# Patient Record
Sex: Female | Born: 1950 | Race: White | Hispanic: No | Marital: Married | Smoking: Never smoker
Health system: Southern US, Community
[De-identification: ages and names within clinical notes are randomized; demographics above are authoritative.]

## PROBLEM LIST (undated history)

## (undated) DIAGNOSIS — T7840XA Allergy, unspecified, initial encounter: Secondary | ICD-10-CM

## (undated) DIAGNOSIS — Z8041 Family history of malignant neoplasm of ovary: Secondary | ICD-10-CM

## (undated) DIAGNOSIS — M5020 Other cervical disc displacement, unspecified cervical region: Secondary | ICD-10-CM

## (undated) DIAGNOSIS — E785 Hyperlipidemia, unspecified: Secondary | ICD-10-CM

## (undated) DIAGNOSIS — E039 Hypothyroidism, unspecified: Secondary | ICD-10-CM

## (undated) DIAGNOSIS — Z1371 Encounter for nonprocreative screening for genetic disease carrier status: Secondary | ICD-10-CM

## (undated) DIAGNOSIS — R760 Raised antibody titer: Secondary | ICD-10-CM

## (undated) DIAGNOSIS — F32A Depression, unspecified: Secondary | ICD-10-CM

## (undated) DIAGNOSIS — E7800 Pure hypercholesterolemia, unspecified: Secondary | ICD-10-CM

## (undated) DIAGNOSIS — M8580 Other specified disorders of bone density and structure, unspecified site: Secondary | ICD-10-CM

## (undated) DIAGNOSIS — Z972 Presence of dental prosthetic device (complete) (partial): Secondary | ICD-10-CM

## (undated) DIAGNOSIS — G4730 Sleep apnea, unspecified: Secondary | ICD-10-CM

## (undated) DIAGNOSIS — E079 Disorder of thyroid, unspecified: Secondary | ICD-10-CM

## (undated) DIAGNOSIS — F419 Anxiety disorder, unspecified: Secondary | ICD-10-CM

## (undated) DIAGNOSIS — M1990 Unspecified osteoarthritis, unspecified site: Secondary | ICD-10-CM

## (undated) DIAGNOSIS — E559 Vitamin D deficiency, unspecified: Secondary | ICD-10-CM

## (undated) DIAGNOSIS — R42 Dizziness and giddiness: Secondary | ICD-10-CM

## (undated) DIAGNOSIS — F329 Major depressive disorder, single episode, unspecified: Secondary | ICD-10-CM

## (undated) DIAGNOSIS — K219 Gastro-esophageal reflux disease without esophagitis: Secondary | ICD-10-CM

## (undated) DIAGNOSIS — B009 Herpesviral infection, unspecified: Secondary | ICD-10-CM

## (undated) DIAGNOSIS — L719 Rosacea, unspecified: Secondary | ICD-10-CM

## (undated) HISTORY — DX: Unspecified osteoarthritis, unspecified site: M19.90

## (undated) HISTORY — DX: Allergy, unspecified, initial encounter: T78.40XA

## (undated) HISTORY — PX: DILATION AND CURETTAGE OF UTERUS: SHX78

## (undated) HISTORY — DX: Family history of malignant neoplasm of ovary: Z80.41

## (undated) HISTORY — DX: Herpesviral infection, unspecified: B00.9

## (undated) HISTORY — DX: Gastro-esophageal reflux disease without esophagitis: K21.9

## (undated) HISTORY — DX: Hyperlipidemia, unspecified: E78.5

## (undated) HISTORY — DX: Depression, unspecified: F32.A

## (undated) HISTORY — DX: Sleep apnea, unspecified: G47.30

## (undated) HISTORY — PX: EYE SURGERY: SHX253

## (undated) HISTORY — DX: Rosacea, unspecified: L71.9

## (undated) HISTORY — DX: Raised antibody titer: R76.0

## (undated) HISTORY — DX: Anxiety disorder, unspecified: F41.9

## (undated) HISTORY — DX: Hypothyroidism, unspecified: E03.9

## (undated) HISTORY — DX: Pure hypercholesterolemia, unspecified: E78.00

## (undated) HISTORY — PX: CERVICAL BIOPSY  W/ LOOP ELECTRODE EXCISION: SUR135

## (undated) HISTORY — DX: Vitamin D deficiency, unspecified: E55.9

## (undated) HISTORY — DX: Encounter for nonprocreative screening for genetic disease carrier status: Z13.71

## (undated) HISTORY — DX: Other specified disorders of bone density and structure, unspecified site: M85.80

## (undated) HISTORY — DX: Major depressive disorder, single episode, unspecified: F32.9

## (undated) HISTORY — PX: COLONOSCOPY: SHX174

## (undated) HISTORY — DX: Disorder of thyroid, unspecified: E07.9

---

## 2004-11-08 ENCOUNTER — Ambulatory Visit: Payer: Self-pay

## 2005-09-25 ENCOUNTER — Ambulatory Visit: Payer: Self-pay | Admitting: Family Medicine

## 2005-11-28 ENCOUNTER — Ambulatory Visit: Payer: Self-pay

## 2006-12-18 ENCOUNTER — Ambulatory Visit: Payer: Self-pay

## 2007-04-02 ENCOUNTER — Ambulatory Visit: Payer: Self-pay | Admitting: Gastroenterology

## 2007-12-24 ENCOUNTER — Ambulatory Visit: Payer: Self-pay

## 2008-12-29 ENCOUNTER — Ambulatory Visit: Payer: Self-pay

## 2010-01-04 ENCOUNTER — Ambulatory Visit: Payer: Self-pay

## 2010-06-08 ENCOUNTER — Ambulatory Visit: Payer: Self-pay | Admitting: Family Medicine

## 2011-01-10 ENCOUNTER — Ambulatory Visit: Payer: Self-pay

## 2012-03-05 ENCOUNTER — Ambulatory Visit: Payer: Self-pay

## 2013-06-14 ENCOUNTER — Ambulatory Visit: Payer: Self-pay | Admitting: Physician Assistant

## 2013-06-14 LAB — RAPID INFLUENZA A&B ANTIGENS

## 2013-09-02 ENCOUNTER — Ambulatory Visit: Payer: Self-pay | Admitting: Family Medicine

## 2014-03-04 ENCOUNTER — Ambulatory Visit: Payer: Self-pay | Admitting: Family Medicine

## 2014-03-08 ENCOUNTER — Telehealth: Payer: Self-pay | Admitting: Neurology

## 2014-03-08 NOTE — Telephone Encounter (Signed)
Pt canceled appt to see Dr Delice Lesch on 04-06-14 due to the fact she got in sooner somewhere else the referring dr was notified

## 2014-03-14 DIAGNOSIS — M1990 Unspecified osteoarthritis, unspecified site: Secondary | ICD-10-CM | POA: Insufficient documentation

## 2014-03-24 ENCOUNTER — Ambulatory Visit: Payer: Self-pay | Admitting: Neurology

## 2014-04-06 ENCOUNTER — Ambulatory Visit: Payer: Self-pay | Admitting: Neurology

## 2014-06-03 DIAGNOSIS — Z8041 Family history of malignant neoplasm of ovary: Secondary | ICD-10-CM

## 2014-06-03 DIAGNOSIS — Z1371 Encounter for nonprocreative screening for genetic disease carrier status: Secondary | ICD-10-CM

## 2014-06-03 HISTORY — DX: Encounter for nonprocreative screening for genetic disease carrier status: Z13.71

## 2014-06-03 HISTORY — DX: Family history of malignant neoplasm of ovary: Z80.41

## 2014-09-05 DIAGNOSIS — J302 Other seasonal allergic rhinitis: Secondary | ICD-10-CM

## 2014-09-05 DIAGNOSIS — M549 Dorsalgia, unspecified: Secondary | ICD-10-CM

## 2014-09-05 DIAGNOSIS — G8929 Other chronic pain: Secondary | ICD-10-CM

## 2014-09-05 DIAGNOSIS — F419 Anxiety disorder, unspecified: Secondary | ICD-10-CM | POA: Insufficient documentation

## 2014-09-05 DIAGNOSIS — M8580 Other specified disorders of bone density and structure, unspecified site: Secondary | ICD-10-CM | POA: Insufficient documentation

## 2014-09-05 DIAGNOSIS — L709 Acne, unspecified: Secondary | ICD-10-CM | POA: Insufficient documentation

## 2014-09-05 DIAGNOSIS — E785 Hyperlipidemia, unspecified: Secondary | ICD-10-CM | POA: Insufficient documentation

## 2014-09-05 DIAGNOSIS — M542 Cervicalgia: Secondary | ICD-10-CM

## 2014-09-05 DIAGNOSIS — L719 Rosacea, unspecified: Secondary | ICD-10-CM | POA: Insufficient documentation

## 2014-09-05 DIAGNOSIS — E559 Vitamin D deficiency, unspecified: Secondary | ICD-10-CM | POA: Insufficient documentation

## 2014-09-05 DIAGNOSIS — J309 Allergic rhinitis, unspecified: Secondary | ICD-10-CM | POA: Insufficient documentation

## 2014-09-05 DIAGNOSIS — I7300 Raynaud's syndrome without gangrene: Secondary | ICD-10-CM | POA: Insufficient documentation

## 2014-09-05 DIAGNOSIS — L700 Acne vulgaris: Secondary | ICD-10-CM

## 2014-09-05 DIAGNOSIS — E7800 Pure hypercholesterolemia, unspecified: Secondary | ICD-10-CM

## 2014-09-05 DIAGNOSIS — E039 Hypothyroidism, unspecified: Secondary | ICD-10-CM

## 2014-09-05 DIAGNOSIS — R4182 Altered mental status, unspecified: Secondary | ICD-10-CM | POA: Insufficient documentation

## 2014-11-03 ENCOUNTER — Ambulatory Visit (INDEPENDENT_AMBULATORY_CARE_PROVIDER_SITE_OTHER): Payer: BLUE CROSS/BLUE SHIELD | Admitting: Family Medicine

## 2014-11-03 ENCOUNTER — Encounter: Payer: Self-pay | Admitting: Family Medicine

## 2014-11-03 VITALS — BP 112/60 | HR 90 | Temp 98.6°F | Resp 16 | Ht 63.0 in | Wt 124.8 lb

## 2014-11-03 DIAGNOSIS — F411 Generalized anxiety disorder: Secondary | ICD-10-CM

## 2014-11-03 DIAGNOSIS — Q752 Hypertelorism: Secondary | ICD-10-CM | POA: Diagnosis not present

## 2014-11-03 DIAGNOSIS — E785 Hyperlipidemia, unspecified: Secondary | ICD-10-CM | POA: Diagnosis not present

## 2014-11-03 NOTE — Patient Instructions (Signed)
Patient to return in 6 months.

## 2014-11-03 NOTE — Progress Notes (Signed)
Patient ID: Sandra Horton, female   DOB: [birth year], 64 y.o.   MRN: [id number]  Name: Sandra Horton   MRN: [id number]    DOB: April 04, 1951   Date:11/03/2014       Progress Note  Subjective  Chief Complaint 64 YO FEMALE HERE FOR F/W OF ANXIETY, HYPOTX, HYPERLIPIDEMIA  Chief Complaint  Patient presents with  . Hypothyroidism    Thyroid Problem Presents for follow-up visit. Symptoms include anxiety, cold intolerance, dry skin, fatigue and hair loss. Patient reports no palpitations, weight gain or weight loss. The symptoms have been stable. Past treatments include levothyroxine. The treatment provided significant relief. The following procedures have not been performed: radioiodine uptake scan, thyroid FNA, thyroid ultrasound and thyroidectomy. Her past medical history is significant for hyperlipidemia. There is no history of atrial fibrillation or diabetes. There are no known risk factors.  Anxiety Presents for follow-up visit. Symptoms include excessive worry, insomnia, muscle tension and nervous/anxious behavior. Patient reports no chest pain, dizziness, hyperventilation or palpitations. Symptoms occur most days. The severity of symptoms is moderate. The symptoms are aggravated by work stress.    Hyperlipidemia This is a chronic problem. The current episode started more than 1 year ago. The problem is controlled. Recent lipid tests were reviewed and are normal. Exacerbating diseases include hypothyroidism. She has no history of diabetes. Pertinent negatives include no chest pain. Current antihyperlipidemic treatment includes statins. There are no compliance problems.  Risk factors for coronary artery disease include dyslipidemia and stress.                This is a 64 year old female here today for her 6 month follow up thyroid diease. Patient states she is doing well with no problems. No wt change, heat or cold intolerance.   Anxiety is stable on current medications;. Hyperlipidemia stable on  current meds.   Past Medical History  Diagnosis Date  . Osteoporosis   . Thyroid disease   . GERD (gastroesophageal reflux disease)   . Anxiety   . Rosacea     Past Surgical History  Procedure Laterality Date  . Colonoscopy      Family History  Problem Relation Age of Onset  . Cancer Mother   . Diabetes Mother   . Thyroid disease Mother   . Hypertension Father   . Stroke Father   . Diabetes Daughter     History   Social History  . Marital Status: Married    Spouse Name: N/A  . Number of Children: N/A  . Years of Education: N/A   Occupational History  . Not on file.   Social History Main Topics  . Smoking status: Never Smoker   . Smokeless tobacco: Not on file  . Alcohol Use: No  . Drug Use: No  . Sexual Activity: Not on file   Other Topics Concern  . Not on file   Social History Narrative     Current outpatient prescriptions:  .  alendronate (FOSAMAX) 35 MG tablet, Take 35 mg by mouth every 7 (seven) days. Take with a full glass of water on an empty stomach., Disp: , Rfl:  .  aspirin 81 MG tablet, Take 81 mg by mouth daily., Disp: , Rfl:  .  doxycycline (DORYX) 100 MG EC tablet, Take 100 mg by mouth 2 (two) times daily., Disp: , Rfl:  .  levothyroxine (SYNTHROID, LEVOTHROID) 125 MCG tablet, Take 125 mcg by mouth daily before breakfast., Disp: , Rfl:  .  loratadine (CLARITIN)  10 MG tablet, Take 10 mg by mouth daily., Disp: , Rfl:  .  lovastatin (MEVACOR) 40 MG tablet, Take 40 mg by mouth at bedtime., Disp: , Rfl:  .  meloxicam (MOBIC) 15 MG tablet, Take 15 mg by mouth daily., Disp: , Rfl:  .  omeprazole (PRILOSEC) 20 MG capsule, Take 20 mg by mouth daily., Disp: , Rfl:  .  PARoxetine (PAXIL) 20 MG tablet, Take 20 mg by mouth daily., Disp: , Rfl:   Allergies  Allergen Reactions  . Penicillin V Potassium Nausea And Vomiting  . Penicillins   . Tetanus Toxoid Nausea And Vomiting     Review of Systems  Constitutional: Positive for fatigue. Negative  for fever, chills, weight loss, weight gain and malaise/fatigue.  Respiratory: Negative for cough.   Cardiovascular: Negative for chest pain and palpitations.  Gastrointestinal: Negative for heartburn.  Genitourinary: Negative for dysuria.  Musculoskeletal: Negative for neck pain.  Neurological: Negative for dizziness.  Endo/Heme/Allergies: Positive for cold intolerance. Negative for polydipsia. Does not bruise/bleed easily.  Psychiatric/Behavioral: Negative for depression. The patient is nervous/anxious and has insomnia.       Objective  Filed Vitals:   11/03/14 0904  BP: 112/60  Pulse: 90  Temp: 98.6 F (37 C)  TempSrc: Oral  Resp: 16  Height: 5\' 3"  (1.6 m)  Weight: 124 lb 12.8 oz (56.609 kg)  SpO2: 97%    Physical Exam  Constitutional: She is oriented to person, place, and time and well-developed, well-nourished, and in no distress.  Eyes: Pupils are equal, round, and reactive to light.  Neck: Neck supple. No tracheal deviation present. No thyromegaly present.  Cardiovascular: Normal rate, regular rhythm and normal heart sounds.   Pulses:      Dorsalis pedis pulses are 2+ on the right side, and 2+ on the left side.       Posterior tibial pulses are 2+ on the right side, and 2+ on the left side.  No edema  Pulmonary/Chest: Effort normal and breath sounds normal.  Musculoskeletal: She exhibits no edema.  Lymphadenopathy:    She has no cervical adenopathy.  Neurological: She is alert and oriented to person, place, and time.  Skin: Skin is warm and dry.  Psychiatric: Mood and memory normal.       No results found for this or any previous visit (from the past 2160 hour(s)).   Assessment & Plan  1. HYPOTHYROIDISM STABLE  - TSH  2. Hyperlipemia  - Comprehensive metabolic panel - Lipid panel  3. Generalized anxiety disorder  DOING WELL ON CURRENT REGIMEN  Patient to return in 6 months for thyroid recheck.

## 2014-11-04 ENCOUNTER — Telehealth: Payer: Self-pay | Admitting: Emergency Medicine

## 2014-11-04 LAB — COMPREHENSIVE METABOLIC PANEL
ALBUMIN: 4.4 g/dL (ref 3.6–4.8)
ALT: 26 IU/L (ref 0–32)
AST: 27 IU/L (ref 0–40)
Albumin/Globulin Ratio: 1.8 (ref 1.1–2.5)
Alkaline Phosphatase: 54 IU/L (ref 39–117)
BUN / CREAT RATIO: 14 (ref 11–26)
BUN: 13 mg/dL (ref 8–27)
Bilirubin Total: 0.4 mg/dL (ref 0.0–1.2)
CALCIUM: 9.8 mg/dL (ref 8.7–10.3)
CHLORIDE: 102 mmol/L (ref 97–108)
CO2: 25 mmol/L (ref 18–29)
CREATININE: 0.95 mg/dL (ref 0.57–1.00)
GFR, EST AFRICAN AMERICAN: 73 mL/min/{1.73_m2} (ref >59)
GFR, EST NON AFRICAN AMERICAN: 63 mL/min/{1.73_m2} (ref >59)
Globulin, Total: 2.4 g/dL (ref 1.5–4.5)
Glucose: 89 mg/dL (ref 65–99)
Potassium: 4.7 mmol/L (ref 3.5–5.2)
SODIUM: 141 mmol/L (ref 134–144)
TOTAL PROTEIN: 6.8 g/dL (ref 6.0–8.5)

## 2014-11-04 LAB — LIPID PANEL
CHOLESTEROL TOTAL: 169 mg/dL (ref 100–199)
Chol/HDL Ratio: 2.4 ratio units (ref 0.0–4.4)
HDL: 71 mg/dL (ref >39)
LDL Calculated: 84 mg/dL (ref 0–99)
Triglycerides: 72 mg/dL (ref 0–149)
VLDL Cholesterol Cal: 14 mg/dL (ref 5–40)

## 2014-11-04 LAB — TSH: TSH: 0.545 u[IU]/mL (ref 0.450–4.500)

## 2014-11-04 NOTE — Telephone Encounter (Signed)
Patient notified normal . Left message on voice mail

## 2014-11-04 NOTE — Telephone Encounter (Signed)
-----   Message from Ashok Norris, MD sent at 11/04/2014  9:32 AM EDT ----- ALL LABS STABLE tsh wnl

## 2014-11-10 ENCOUNTER — Telehealth: Payer: Self-pay | Admitting: Family Medicine

## 2014-11-10 NOTE — Progress Notes (Signed)
Patient notified

## 2014-11-10 NOTE — Telephone Encounter (Signed)
ALL LABS STABLE

## 2014-11-10 NOTE — Telephone Encounter (Signed)
See result note.  

## 2014-11-12 ENCOUNTER — Other Ambulatory Visit: Payer: Self-pay | Admitting: Family Medicine

## 2015-01-21 ENCOUNTER — Other Ambulatory Visit: Payer: Self-pay | Admitting: Family Medicine

## 2015-01-27 ENCOUNTER — Other Ambulatory Visit: Payer: Self-pay | Admitting: Emergency Medicine

## 2015-01-27 MED ORDER — PAROXETINE HCL 20 MG PO TABS: 20.0000 mg | ORAL_TABLET | Freq: Every day | ORAL | Status: DC

## 2015-03-23 LAB — HM PAP SMEAR: HM Pap smear: NEGATIVE

## 2015-04-12 ENCOUNTER — Encounter: Payer: Self-pay | Admitting: Family Medicine

## 2015-04-22 ENCOUNTER — Other Ambulatory Visit: Payer: Self-pay | Admitting: Family Medicine

## 2015-05-04 ENCOUNTER — Ambulatory Visit: Payer: Medicare Other | Admitting: Family Medicine

## 2015-05-11 ENCOUNTER — Encounter: Payer: Self-pay | Admitting: Family Medicine

## 2015-05-11 ENCOUNTER — Ambulatory Visit (INDEPENDENT_AMBULATORY_CARE_PROVIDER_SITE_OTHER): Payer: BLUE CROSS/BLUE SHIELD | Admitting: Family Medicine

## 2015-05-11 VITALS — BP 120/68 | HR 101 | Temp 98.6°F | Resp 16 | Ht 63.0 in | Wt 125.9 lb

## 2015-05-11 DIAGNOSIS — M1990 Unspecified osteoarthritis, unspecified site: Secondary | ICD-10-CM

## 2015-05-11 DIAGNOSIS — R002 Palpitations: Secondary | ICD-10-CM | POA: Diagnosis not present

## 2015-05-11 DIAGNOSIS — E7800 Pure hypercholesterolemia, unspecified: Secondary | ICD-10-CM | POA: Diagnosis not present

## 2015-05-11 DIAGNOSIS — F411 Generalized anxiety disorder: Secondary | ICD-10-CM

## 2015-05-11 DIAGNOSIS — I6782 Cerebral ischemia: Secondary | ICD-10-CM | POA: Insufficient documentation

## 2015-05-11 DIAGNOSIS — E785 Hyperlipidemia, unspecified: Secondary | ICD-10-CM

## 2015-05-11 DIAGNOSIS — M8580 Other specified disorders of bone density and structure, unspecified site: Secondary | ICD-10-CM | POA: Diagnosis not present

## 2015-05-11 DIAGNOSIS — G939 Disorder of brain, unspecified: Secondary | ICD-10-CM | POA: Insufficient documentation

## 2015-05-11 DIAGNOSIS — E559 Vitamin D deficiency, unspecified: Secondary | ICD-10-CM

## 2015-05-11 DIAGNOSIS — E039 Hypothyroidism, unspecified: Secondary | ICD-10-CM

## 2015-05-11 DIAGNOSIS — F329 Major depressive disorder, single episode, unspecified: Secondary | ICD-10-CM

## 2015-05-11 DIAGNOSIS — F32A Depression, unspecified: Secondary | ICD-10-CM

## 2015-05-11 NOTE — Progress Notes (Signed)
Name: Sandra Horton   MRN: [id number]    DOB: [birth year]   Date:05/11/2015       Progress Note  Subjective  Chief Complaint  Chief Complaint  Patient presents with  . Depression    4 month follow up  . Hypothyroidism  . Neck Pain  . Hyperlipidemia    HPI  Patient presents for follow-up of hypothyroidism. It has been present for over 5 years.  Current symptoms consist of levothyroxin . Current medication regimen consist of levothyroxin microg 125 g  daily .   There is good compliance with regimen.  there are no current complaints of fatigue and hair loss skin changes constipation   Hyperlipidemia  Patient has a history of hyperlipidover 64ears.  Current medical regimen consist oflovastatin 40 mg daily at bedtime ComplianceGood Diet and exercise are currently followed  regularlysk factors for cardiovascular disease include hyperlipidemia and age .   There have been no side effects from the medication.    Depression history of present illness  Patient has had depression for greater than 5 years. Is currently well controlled on her regimen of Paxil 20 mg once daily. There are no crying spells she sleeping well and energy level is good to the point she plans to keep working until age 64.  Osteoporosis  . Currently patient is on alendronate she last had her BMD done 10:15 is being followed by her gynecologist and is stable there. She is receiving her vitamin D from the gynecologist as well `  Past Medical History  Diagnosis Date  . Osteoporosis   . Thyroid disease   . GERD (gastroesophageal reflux disease)   . Anxiety   . Rosacea     Social History  Substance Use Topics  . Smoking status: Never Smoker   . Smokeless tobacco: Not on file  . Alcohol Use: No     Current outpatient prescriptions:  .  alendronate (FOSAMAX) 35 MG tablet, Take 35 mg by mouth every 7 (seven) days. Take with a full glass of water on an empty stomach., Disp: , Rfl:  .  aspirin 81 MG tablet, Take 81 mg  by mouth daily., Disp: , Rfl:  .  levothyroxine (SYNTHROID, LEVOTHROID) 125 MCG tablet, Take 125 mcg by mouth daily before breakfast., Disp: , Rfl:  .  loratadine (CLARITIN) 10 MG tablet, Take 10 mg by mouth daily., Disp: , Rfl:  .  lovastatin (MEVACOR) 40 MG tablet, Take 40 mg by mouth at bedtime., Disp: , Rfl:  .  meloxicam (MOBIC) 15 MG tablet, TAKE ONE TABLET BY MOUTH ONCE DAILY WITH FOOD, Disp: 90 tablet, Rfl: 0 .  omeprazole (PRILOSEC) 20 MG capsule, Take 20 mg by mouth daily., Disp: , Rfl:  .  PARoxetine (PAXIL) 20 MG tablet, Take 1 tablet (20 mg total) by mouth daily., Disp: 90 tablet, Rfl: 1 .  doxycycline (DORYX) 100 MG EC tablet, Take 100 mg by mouth 2 (two) times daily., Disp: , Rfl:   Allergies  Allergen Reactions  . Penicillin V Potassium Nausea And Vomiting  . Penicillins   . Tetanus Toxoid Nausea And Vomiting    Review of Systems  Constitutional: Negative for fever, chills and weight loss.  HENT: Negative for congestion, hearing loss, sore throat and tinnitus.   Eyes: Negative for blurred vision, double vision and redness.  Respiratory: Negative for cough, hemoptysis and shortness of breath.   Cardiovascular: Positive for palpitations. Negative for chest pain, orthopnea, claudication and leg swelling.  Gastrointestinal: Negative for  heartburn, nausea, vomiting, diarrhea, constipation and blood in stool.  Genitourinary: Negative for dysuria, urgency, frequency and hematuria.  Musculoskeletal: Positive for joint pain. Negative for myalgias, back pain, falls and neck pain.  Skin: Negative for itching.  Neurological: Negative for dizziness, tingling, tremors, focal weakness, seizures, loss of consciousness, weakness and headaches.  Endo/Heme/Allergies: Does not bruise/bleed easily.  Psychiatric/Behavioral: Negative for depression and substance abuse. The patient is nervous/anxious. The patient does not have insomnia.      Objective  Filed Vitals:   05/11/15 0732  BP:  120/68  Pulse: 101  Temp: 98.6 F (37 C)  TempSrc: Oral  Resp: 16  Height: 5\' 3"  (1.6 m)  Weight: 125 lb 14.4 oz (57.108 kg)  SpO2: 94%     Physical Exam  Constitutional: She is oriented to person, place, and time and well-developed, well-nourished, and in no distress.  HENT:  Head: Normocephalic.  Eyes: EOM are normal. Pupils are equal, round, and reactive to light.  Neck: Normal range of motion. No thyromegaly present.  Cardiovascular: Normal rate, regular rhythm and normal heart sounds.   No murmur heard. Pulmonary/Chest: Effort normal and breath sounds normal.  Musculoskeletal: Normal range of motion. She exhibits no edema.  Neurological: She is alert and oriented to person, place, and time. No cranial nerve deficit. Gait normal.  Skin: Skin is warm and dry. No rash noted.  Psychiatric: Memory and affect normal.      Assessment & Plan

## 2015-05-12 ENCOUNTER — Telehealth: Payer: Self-pay | Admitting: Emergency Medicine

## 2015-05-12 LAB — COMPREHENSIVE METABOLIC PANEL
A/G RATIO: 2.3 (ref 1.1–2.5)
ALBUMIN: 4.6 g/dL (ref 3.6–4.8)
ALK PHOS: 55 IU/L (ref 39–117)
ALT: 22 IU/L (ref 0–32)
AST: 23 IU/L (ref 0–40)
BUN / CREAT RATIO: 11 (ref 11–26)
BUN: 11 mg/dL (ref 8–27)
Bilirubin Total: 0.5 mg/dL (ref 0.0–1.2)
CO2: 28 mmol/L (ref 18–29)
CREATININE: 0.96 mg/dL (ref 0.57–1.00)
Calcium: 10.5 mg/dL — ABNORMAL HIGH (ref 8.7–10.3)
Chloride: 99 mmol/L (ref 97–106)
GFR calc Af Amer: 72 mL/min/{1.73_m2} (ref >59)
GFR, EST NON AFRICAN AMERICAN: 62 mL/min/{1.73_m2} (ref >59)
GLOBULIN, TOTAL: 2 g/dL (ref 1.5–4.5)
Glucose: 88 mg/dL (ref 65–99)
Potassium: 4.5 mmol/L (ref 3.5–5.2)
SODIUM: 143 mmol/L (ref 136–144)
Total Protein: 6.6 g/dL (ref 6.0–8.5)

## 2015-05-12 LAB — LIPID PANEL
CHOL/HDL RATIO: 2.2 ratio (ref 0.0–4.4)
CHOLESTEROL TOTAL: 184 mg/dL (ref 100–199)
HDL: 84 mg/dL (ref >39)
LDL CALC: 90 mg/dL (ref 0–99)
Triglycerides: 51 mg/dL (ref 0–149)
VLDL Cholesterol Cal: 10 mg/dL (ref 5–40)

## 2015-05-12 LAB — TSH: TSH: 5.01 u[IU]/mL — ABNORMAL HIGH (ref 0.450–4.500)

## 2015-05-12 NOTE — Progress Notes (Signed)
Left message for patient to call office.  

## 2015-05-12 NOTE — Telephone Encounter (Signed)
Patient notified of lab results. Will repeat labs then come in to be seen.

## 2015-05-13 ENCOUNTER — Encounter: Payer: Self-pay | Admitting: Family Medicine

## 2015-05-22 ENCOUNTER — Other Ambulatory Visit: Payer: Self-pay | Admitting: Family Medicine

## 2015-05-23 LAB — PARATHYROID HORMONE, INTACT (NO CA): PTH: 32 pg/mL (ref 15–65)

## 2015-05-23 LAB — CALCIUM, IONIZED: Calcium, Ion: 5.3 mg/dL (ref 4.5–5.6)

## 2015-05-25 ENCOUNTER — Telehealth: Payer: Self-pay | Admitting: Family Medicine

## 2015-05-25 NOTE — Telephone Encounter (Signed)
Patient called wanting the results of her lab results

## 2015-05-25 NOTE — Telephone Encounter (Signed)
Spoke to pt and informed her that Dr. Rutherford Nail has not signed off on her results yet and that either [facility] or I will call her when he does.

## 2015-05-30 NOTE — Telephone Encounter (Signed)
Patient notified of labs.   

## 2015-06-01 ENCOUNTER — Encounter: Payer: Self-pay | Admitting: Family Medicine

## 2015-07-22 ENCOUNTER — Other Ambulatory Visit: Payer: Self-pay | Admitting: Family Medicine

## 2015-07-26 ENCOUNTER — Other Ambulatory Visit: Payer: Self-pay | Admitting: Family Medicine

## 2015-08-27 ENCOUNTER — Other Ambulatory Visit: Payer: Self-pay | Admitting: Family Medicine

## 2015-10-07 ENCOUNTER — Other Ambulatory Visit: Payer: Self-pay | Admitting: Family Medicine

## 2015-10-12 ENCOUNTER — Other Ambulatory Visit: Payer: Self-pay | Admitting: Family Medicine

## 2015-10-14 ENCOUNTER — Other Ambulatory Visit: Payer: Self-pay | Admitting: Family Medicine

## 2015-10-28 ENCOUNTER — Other Ambulatory Visit: Payer: Self-pay | Admitting: Family Medicine

## 2015-11-09 ENCOUNTER — Ambulatory Visit (INDEPENDENT_AMBULATORY_CARE_PROVIDER_SITE_OTHER): Payer: BLUE CROSS/BLUE SHIELD | Admitting: Family Medicine

## 2015-11-09 ENCOUNTER — Ambulatory Visit: Payer: Medicare Other | Admitting: Family Medicine

## 2015-11-09 ENCOUNTER — Encounter: Payer: Self-pay | Admitting: Family Medicine

## 2015-11-09 VITALS — BP 104/68 | HR 80 | Temp 98.0°F | Resp 17 | Ht 63.0 in | Wt 125.3 lb

## 2015-11-09 DIAGNOSIS — F3342 Major depressive disorder, recurrent, in full remission: Secondary | ICD-10-CM

## 2015-11-09 DIAGNOSIS — M549 Dorsalgia, unspecified: Secondary | ICD-10-CM

## 2015-11-09 DIAGNOSIS — M542 Cervicalgia: Secondary | ICD-10-CM | POA: Diagnosis not present

## 2015-11-09 DIAGNOSIS — G8929 Other chronic pain: Secondary | ICD-10-CM

## 2015-11-09 MED ORDER — PAROXETINE HCL 20 MG PO TABS: 20.0000 mg | ORAL_TABLET | Freq: Every day | ORAL | Status: DC

## 2015-11-09 MED ORDER — MELOXICAM 15 MG PO TABS: 15.0000 mg | ORAL_TABLET | Freq: Every day | ORAL | Status: DC

## 2015-11-09 NOTE — Progress Notes (Signed)
Name: Sandra Horton   MRN: [id number]    DOB: 11/02/1950   Date:11/09/2015       Progress Note  Subjective  Chief Complaint  Chief Complaint  Patient presents with  . Follow-up    6 MO  . Medication Refill    meloxicam 15 mg / paxil 20 mg   This patient is awake, normally followed by Dr. Lucita Lora. Previous medical, surgical, family, social history updated Neck Pain  This is a chronic problem. The current episode started more than 1 year ago (Over 15 years ago.). The quality of the pain is described as burning and aching. The symptoms are aggravated by position (worse towards end of the day and after lifting.). Stiffness is present at night. She has tried NSAIDs and heat for the symptoms.  Depression        This is a chronic problem.  The current episode started more than 1 year ago (Prior to her diagnosis of Hypothyroidism). The problem is unchanged.  Associated symptoms include no fatigue, no hopelessness, no appetite change and not sad.  Past treatments include SSRIs - Selective serotonin reuptake inhibitors.  Compliance with treatment is good.  Previous treatment provided significant relief.  Past medical history includes chronic pain.     Past Medical History  Diagnosis Date  . Osteoporosis   . Thyroid disease   . GERD (gastroesophageal reflux disease)   . Anxiety   . Rosacea   . Depression   . Hyperlipidemia     Past Surgical History  Procedure Laterality Date  . Colonoscopy    . Dilation and curettage of uterus  30 years ago    Family History  Problem Relation Age of Onset  . Cancer Mother   . Diabetes Mother   . Thyroid disease Mother   . Hypertension Father   . Stroke Father   . Diabetes Daughter   . Cancer Sister     Ovarian CA  . Thyroid disease Sister     Social History   Social History  . Marital Status: Married    Spouse Name: N/A  . Number of Children: N/A  . Years of Education: N/A   Occupational History  . Not on file.   Social History Main  Topics  . Smoking status: Never Smoker   . Smokeless tobacco: Not on file  . Alcohol Use: No  . Drug Use: No  . Sexual Activity: Yes   Other Topics Concern  . Not on file   Social History Narrative     Current outpatient prescriptions:  .  alendronate (FOSAMAX) 35 MG tablet, Take 1 tablet by mouth once a week, Disp: 12 tablet, Rfl: 0 .  aspirin 81 MG tablet, Take 81 mg by mouth daily., Disp: , Rfl:  .  levothyroxine (SYNTHROID, LEVOTHROID) 112 MCG tablet, TAKE ONE TABLET BY MOUTH ONCE DAILY, Disp: 90 tablet, Rfl: 0 .  loratadine (CLARITIN) 10 MG tablet, Take 10 mg by mouth daily., Disp: , Rfl:  .  lovastatin (MEVACOR) 40 MG tablet, TAKE 1 TABLET by mouth  daily, Disp: 90 tablet, Rfl: 0 .  meloxicam (MOBIC) 15 MG tablet, TAKE ONE TABLET BY MOUTH ONCE DAILY WITH FOOD, Disp: 90 tablet, Rfl: 0 .  omeprazole (PRILOSEC) 20 MG capsule, TAKE 1 CAPSULE BY MOUTH  TWICE A DAY, Disp: 180 capsule, Rfl: 0 .  PARoxetine (PAXIL) 20 MG tablet, TAKE ONE TABLET BY MOUTH ONCE DAILY, Disp: 90 tablet, Rfl: 0  Allergies  Allergen Reactions  .  Penicillin V Potassium Nausea And Vomiting  . Penicillins   . Tetanus Toxoid Nausea And Vomiting     Review of Systems  Constitutional: Negative for appetite change and fatigue.  Musculoskeletal: Positive for neck pain.  Psychiatric/Behavioral: Positive for depression.    Objective  Filed Vitals:   11/09/15 0928  BP: 104/68  Pulse: 80  Temp: 98 F (36.7 C)  TempSrc: Oral  Resp: 17  Height: 5\' 3"  (1.6 m)  Weight: 125 lb 4.8 oz (56.836 kg)  SpO2: 96%    Physical Exam  Constitutional: She is oriented to person, place, and time and well-developed, well-nourished, and in no distress.  HENT:  Head: Normocephalic and atraumatic.  Cardiovascular: Normal rate and regular rhythm.   Pulmonary/Chest: Effort normal and breath sounds normal.  Musculoskeletal:       Cervical back: She exhibits tenderness, pain and spasm.       Back:  Neurological: She  is alert and oriented to person, place, and time.  Nursing note and vitals reviewed.      Assessment & Plan  1. Depression, major, recurrent, in complete remission ([place]) Table, responsive to SSRI therapy. Refills provided - PARoxetine (PAXIL) 20 MG tablet; Take 1 tablet (20 mg total) by mouth daily.  Dispense: 90 tablet; Refill: 1  2. Chronic neck and back pain Meloxicam taken intermittently as needed for neck and back pain - meloxicam (MOBIC) 15 MG tablet; Take 1 tablet (15 mg total) by mouth daily.  Dispense: 90 tablet; Refill: 1   Braheem Tomasik [facility] 11/09/2015 9:56 AM

## 2015-11-11 ENCOUNTER — Other Ambulatory Visit: Payer: Self-pay | Admitting: Family Medicine

## 2015-12-30 ENCOUNTER — Other Ambulatory Visit: Payer: Self-pay | Admitting: Family Medicine

## 2016-01-04 ENCOUNTER — Other Ambulatory Visit: Payer: Self-pay | Admitting: Family Medicine

## 2016-01-09 NOTE — Telephone Encounter (Signed)
Omeprazole has been refilled and sent to [facility]

## 2016-01-09 NOTE — Telephone Encounter (Signed)
Medication has been refilled and sent to [facility] 

## 2016-02-22 ENCOUNTER — Encounter: Payer: Self-pay | Admitting: Family Medicine

## 2016-02-22 ENCOUNTER — Other Ambulatory Visit: Payer: Self-pay | Admitting: Family Medicine

## 2016-02-22 ENCOUNTER — Ambulatory Visit (INDEPENDENT_AMBULATORY_CARE_PROVIDER_SITE_OTHER): Payer: BLUE CROSS/BLUE SHIELD | Admitting: Family Medicine

## 2016-02-22 VITALS — BP 116/74 | HR 74 | Temp 98.2°F | Ht 62.0 in | Wt 126.0 lb

## 2016-02-22 DIAGNOSIS — Z23 Encounter for immunization: Secondary | ICD-10-CM | POA: Diagnosis not present

## 2016-02-22 DIAGNOSIS — M549 Dorsalgia, unspecified: Secondary | ICD-10-CM

## 2016-02-22 DIAGNOSIS — Z130 Encounter for screening for diseases of the blood and blood-forming organs and certain disorders involving the immune mechanism: Secondary | ICD-10-CM

## 2016-02-22 DIAGNOSIS — E7800 Pure hypercholesterolemia, unspecified: Secondary | ICD-10-CM

## 2016-02-22 DIAGNOSIS — M542 Cervicalgia: Secondary | ICD-10-CM

## 2016-02-22 DIAGNOSIS — Z0000 Encounter for general adult medical examination without abnormal findings: Secondary | ICD-10-CM | POA: Diagnosis not present

## 2016-02-22 DIAGNOSIS — F3342 Major depressive disorder, recurrent, in full remission: Secondary | ICD-10-CM | POA: Diagnosis not present

## 2016-02-22 DIAGNOSIS — G8929 Other chronic pain: Secondary | ICD-10-CM

## 2016-02-22 DIAGNOSIS — E039 Hypothyroidism, unspecified: Secondary | ICD-10-CM

## 2016-02-22 DIAGNOSIS — Z1211 Encounter for screening for malignant neoplasm of colon: Secondary | ICD-10-CM

## 2016-02-22 LAB — COMPREHENSIVE METABOLIC PANEL
ALBUMIN: 4.1 g/dL (ref 3.5–5.2)
ALT: 13 U/L (ref 0–35)
AST: 16 U/L (ref 0–37)
Alkaline Phosphatase: 50 U/L (ref 39–117)
BUN: 11 mg/dL (ref 6–23)
CALCIUM: 9.2 mg/dL (ref 8.4–10.5)
CHLORIDE: 107 meq/L (ref 96–112)
CO2: 30 meq/L (ref 19–32)
CREATININE: 0.97 mg/dL (ref 0.40–1.20)
GFR: 60.97 mL/min (ref >60.00)
Glucose, Bld: 83 mg/dL (ref 70–99)
Potassium: 4.2 mEq/L (ref 3.5–5.1)
Sodium: 141 mEq/L (ref 135–145)
TOTAL PROTEIN: 7.1 g/dL (ref 6.0–8.3)
Total Bilirubin: 0.6 mg/dL (ref 0.2–1.2)

## 2016-02-22 LAB — LIPID PANEL
CHOL/HDL RATIO: 2
Cholesterol: 145 mg/dL (ref 0–200)
HDL: 58.9 mg/dL (ref >39.00)
LDL CALC: 71 mg/dL (ref 0–99)
NonHDL: 85.73
Triglycerides: 76 mg/dL (ref 0.0–149.0)
VLDL: 15.2 mg/dL (ref 0.0–40.0)

## 2016-02-22 LAB — CBC
HEMATOCRIT: 38 % (ref 36.0–46.0)
HEMOGLOBIN: 13 g/dL (ref 12.0–15.0)
MCHC: 34.2 g/dL (ref 30.0–36.0)
MCV: 91.6 fl (ref 78.0–100.0)
PLATELETS: 230 10*3/uL (ref 150.0–400.0)
RBC: 4.16 Mil/uL (ref 3.87–5.11)
RDW: 12.4 % (ref 11.5–15.5)
WBC: 3.4 10*3/uL — AB (ref 4.0–10.5)

## 2016-02-22 LAB — TSH: TSH: 0.15 u[IU]/mL — AB (ref 0.35–4.50)

## 2016-02-22 MED ORDER — OMEPRAZOLE 20 MG PO CPDR: 20.0000 mg | DELAYED_RELEASE_CAPSULE | Freq: Two times a day (BID) | ORAL | 3 refills | Status: DC

## 2016-02-22 MED ORDER — LEVOTHYROXINE SODIUM 100 MCG PO TABS: 100.0000 ug | ORAL_TABLET | Freq: Every day | ORAL | 0 refills | Status: DC

## 2016-02-22 MED ORDER — LOVASTATIN 40 MG PO TABS: 40.0000 mg | ORAL_TABLET | Freq: Every day | ORAL | 3 refills | Status: DC

## 2016-02-22 MED ORDER — PAROXETINE HCL 20 MG PO TABS: 20.0000 mg | ORAL_TABLET | Freq: Every day | ORAL | 3 refills | Status: DC

## 2016-02-22 MED ORDER — MELOXICAM 15 MG PO TABS: 15.0000 mg | ORAL_TABLET | Freq: Every day | ORAL | 1 refills | Status: DC

## 2016-02-22 NOTE — Assessment & Plan Note (Signed)
[place] no longer needed. Has upcoming mammogram. Will place a referral for colonoscopy. Prevnar today. Declines flu. Zostavax completed. Labs today. See orders.

## 2016-02-22 NOTE — Patient Instructions (Signed)
Stop the fosamax.  We will call regarding your referral for colonoscopy.  Follow up in 6 months to 1 year.  Take care  Dr. Lacinda Axon   Health Maintenance, Female Adopting a healthy lifestyle and getting preventive care can go a long way to promote health and wellness. Talk with your health care provider about what schedule of regular examinations is right for you. This is a good chance for you to check in with your provider about disease prevention and staying healthy. In between checkups, there are plenty of things you can do on your own. Experts have done a lot of research about which lifestyle changes and preventive measures are most likely to keep you healthy. Ask your health care provider for more information. WEIGHT AND DIET  Eat a healthy diet  Be sure to include plenty of vegetables, fruits, low-fat dairy products, and lean protein.  Do not eat a lot of foods high in solid fats, added sugars, or salt.  Get regular exercise. This is one of the most important things you can do for your health.  Most adults should exercise for at least 150 minutes each week. The exercise should increase your heart rate and make you sweat (moderate-intensity exercise).  Most adults should also do strengthening exercises at least twice a week. This is in addition to the moderate-intensity exercise.  Maintain a healthy weight  Body mass index (BMI) is a measurement that can be used to identify possible weight problems. It estimates body fat based on height and weight. Your health care provider can help determine your BMI and help you achieve or maintain a healthy weight.  For females 41 years of age and older:   A BMI below 18.5 is considered underweight.  A BMI of 18.5 to 24.9 is normal.  A BMI of 25 to 29.9 is considered overweight.  A BMI of 30 and above is considered obese.  Watch levels of cholesterol and blood lipids  You should start having your blood tested for lipids and cholesterol  at 67 years of age, then have this test every 5 years.  You may need to have your cholesterol levels checked more often if:  Your lipid or cholesterol levels are high.  You are older than 66 years of age.  You are at high risk for heart disease.  CANCER SCREENING   Lung Cancer  Lung cancer screening is recommended for adults 87-55 years old who are at high risk for lung cancer because of a history of smoking.  A yearly low-dose CT scan of the lungs is recommended for people who:  Currently smoke.  Have quit within the past 15 years.  Have at least a 30-pack-year history of smoking. A pack year is smoking an average of one pack of cigarettes a day for 1 year.  Yearly screening should continue until it has been 15 years since you quit.  Yearly screening should stop if you develop a health problem that would prevent you from having lung cancer treatment.  Breast Cancer  Practice breast self-awareness. This means understanding how your breasts normally appear and feel.  It also means doing regular breast self-exams. Let your health care provider know about any changes, no matter how small.  If you are in your 20s or 30s, you should have a clinical breast exam (CBE) by a health care provider every 1-3 years as part of a regular health exam.  If you are 31 or older, have a CBE every year. Also consider  having a breast X-ray (mammogram) every year.  If you have a family history of breast cancer, talk to your health care provider about genetic screening.  If you are at high risk for breast cancer, talk to your health care provider about having an MRI and a mammogram every year.  Breast cancer gene (BRCA) assessment is recommended for women who have family members with BRCA-related cancers. BRCA-related cancers include:  Breast.  Ovarian.  Tubal.  Peritoneal cancers.  Results of the assessment will determine the need for genetic counseling and BRCA1 and BRCA2  testing. Cervical Cancer Your health care provider may recommend that you be screened regularly for cancer of the pelvic organs (ovaries, uterus, and vagina). This screening involves a pelvic examination, including checking for microscopic changes to the surface of your cervix (Pap test). You may be encouraged to have this screening done every 3 years, beginning at age 39.  For women ages 2-65, health care providers may recommend pelvic exams and Pap testing every 3 years, or they may recommend the Pap and pelvic exam, combined with testing for human papilloma virus (HPV), every 5 years. Some types of HPV increase your risk of cervical cancer. Testing for HPV may also be done on women of any age with unclear Pap test results.  Other health care providers may not recommend any screening for nonpregnant women who are considered low risk for pelvic cancer and who do not have symptoms. Ask your health care provider if a screening pelvic exam is right for you.  If you have had past treatment for cervical cancer or a condition that could lead to cancer, you need Pap tests and screening for cancer for at least 20 years after your treatment. If Pap tests have been discontinued, your risk factors (such as having a new sexual partner) need to be reassessed to determine if screening should resume. Some women have medical problems that increase the chance of getting cervical cancer. In these cases, your health care provider may recommend more frequent screening and Pap tests. Colorectal Cancer  This type of cancer can be detected and often prevented.  Routine colorectal cancer screening usually begins at 66 years of age and continues through 66 years of age.  Your health care provider may recommend screening at an earlier age if you have risk factors for colon cancer.  Your health care provider may also recommend using home test kits to check for hidden blood in the stool.  A small camera at the end of a  tube can be used to examine your colon directly (sigmoidoscopy or colonoscopy). This is done to check for the earliest forms of colorectal cancer.  Routine screening usually begins at age 73.  Direct examination of the colon should be repeated every 5-10 years through 66 years of age. However, you may need to be screened more often if early forms of precancerous polyps or small growths are found. Skin Cancer  Check your skin from head to toe regularly.  Tell your health care provider about any new moles or changes in moles, especially if there is a change in a mole's shape or color.  Also tell your health care provider if you have a mole that is larger than the size of a pencil eraser.  Always use sunscreen. Apply sunscreen liberally and repeatedly throughout the day.  Protect yourself by wearing long sleeves, pants, a wide-brimmed hat, and sunglasses whenever you are outside. HEART DISEASE, DIABETES, AND HIGH BLOOD PRESSURE   High blood  pressure causes heart disease and increases the risk of stroke. High blood pressure is more likely to develop in:  People who have blood pressure in the high end of the normal range (130-139/85-89 mm Hg).  People who are overweight or obese.  People who are African American.  If you are 48-67 years of age, have your blood pressure checked every 3-5 years. If you are 48 years of age or older, have your blood pressure checked every year. You should have your blood pressure measured twice--once when you are at a hospital or clinic, and once when you are not at a hospital or clinic. Record the average of the two measurements. To check your blood pressure when you are not at a hospital or clinic, you can use:  An automated blood pressure machine at a pharmacy.  A home blood pressure monitor.  If you are between 85 years and 8 years old, ask your health care provider if you should take aspirin to prevent strokes.  Have regular diabetes screenings. This  involves taking a blood sample to check your fasting blood sugar level.  If you are at a normal weight and have a low risk for diabetes, have this test once every three years after 66 years of age.  If you are overweight and have a high risk for diabetes, consider being tested at a younger age or more often. PREVENTING INFECTION  Hepatitis B  If you have a higher risk for hepatitis B, you should be screened for this virus. You are considered at high risk for hepatitis B if:  You were born in a country where hepatitis B is common. Ask your health care provider which countries are considered high risk.  Your parents were born in a high-risk country, and you have not been immunized against hepatitis B (hepatitis B vaccine).  You have HIV or AIDS.  You use needles to inject street drugs.  You live with someone who has hepatitis B.  You have had sex with someone who has hepatitis B.  You get hemodialysis treatment.  You take certain medicines for conditions, including cancer, organ transplantation, and autoimmune conditions. Hepatitis C  Blood testing is recommended for:  Everyone born from [birth year] through 1966.  Anyone with known risk factors for hepatitis C. Sexually transmitted infections (STIs)  You should be screened for sexually transmitted infections (STIs) including gonorrhea and chlamydia if:  You are sexually active and are younger than 66 years of age.  You are older than 66 years of age and your health care provider tells you that you are at risk for this type of infection.  Your sexual activity has changed since you were last screened and you are at an increased risk for chlamydia or gonorrhea. Ask your health care provider if you are at risk.  If you do not have HIV, but are at risk, it may be recommended that you take a prescription medicine daily to prevent HIV infection. This is called pre-exposure prophylaxis (PrEP). You are considered at risk if:  You are  sexually active and do not regularly use condoms or know the HIV status of your partner(s).  You take drugs by injection.  You are sexually active with a partner who has HIV. Talk with your health care provider about whether you are at high risk of being infected with HIV. If you choose to begin PrEP, you should first be tested for HIV. You should then be tested every 3 months for as long as  you are taking PrEP.  PREGNANCY   If you are premenopausal and you may become pregnant, ask your health care provider about preconception counseling.  If you may become pregnant, take 400 to 800 micrograms (mcg) of folic acid every day.  If you want to prevent pregnancy, talk to your health care provider about birth control (contraception). OSTEOPOROSIS AND MENOPAUSE   Osteoporosis is a disease in which the bones lose minerals and strength with aging. This can result in serious bone fractures. Your risk for osteoporosis can be identified using a bone density scan.  If you are 21 years of age or older, or if you are at risk for osteoporosis and fractures, ask your health care provider if you should be screened.  Ask your health care provider whether you should take a calcium or vitamin D supplement to lower your risk for osteoporosis.  Menopause may have certain physical symptoms and risks.  Hormone replacement therapy may reduce some of these symptoms and risks. Talk to your health care provider about whether hormone replacement therapy is right for you.  HOME CARE INSTRUCTIONS   Schedule regular health, dental, and eye exams.  Stay current with your immunizations.   Do not use any tobacco products including cigarettes, chewing tobacco, or electronic cigarettes.  If you are pregnant, do not drink alcohol.  If you are breastfeeding, limit how much and how often you drink alcohol.  Limit alcohol intake to no more than 1 drink per day for nonpregnant women. One drink equals 12 ounces of beer, 5  ounces of wine, or 1 ounces of hard liquor.  Do not use street drugs.  Do not share needles.  Ask your health care provider for help if you need support or information about quitting drugs.  Tell your health care provider if you often feel depressed.  Tell your health care provider if you have ever been abused or do not feel safe at home.   This information is not intended to replace advice given to you by your health care provider. Make sure you discuss any questions you have with your health care provider.   Document Released: 12/03/2010 Document Revised: 06/10/2014 Document Reviewed: 04/21/2013 Elsevier Interactive Patient Education [employer].

## 2016-02-22 NOTE — Progress Notes (Signed)
Subjective:  Patient ID: Sandra Horton, female    DOB: May 19, 1951  Age: 65 y.o. MRN: [id number]  CC: Establish care/physical  HPI Sandra Horton is a 65 y.o. female presents to the clinic today to establish care.  Preventative Healthcare  Pap smear: No longer needed due to age.  Mammogram: Scheduled soon.   Colonoscopy: In need of.  Immunizations  Tetanus - Had allergic response to tetanus toxoid.  Pneumococcal - In need of.  Flu - Declines.  Zoster - Done.  Hepatitis C screening - Has had in the past.  Labs: Labs today - see orders.  Alcohol use: No.  Smoking/tobacco use: No.  STD/HIV testing: Declines.  PMH, Surgical Hx, Family Hx, Social History reviewed and updated as below.  Past Medical History:  Diagnosis Date  . Allergy   . Anxiety   . Depression   . GERD (gastroesophageal reflux disease)   . Hyperlipidemia   . Osteoporosis   . Rosacea   . Thyroid disease    Past Surgical History:  Procedure Laterality Date  . COLONOSCOPY    . DILATION AND CURETTAGE OF UTERUS  30 years ago   Family History  Problem Relation Age of Onset  . Cancer Mother   . Diabetes Mother   . Thyroid disease Mother   . Hypertension Father   . Stroke Father   . Cancer Sister     Ovarian CA  . Thyroid disease Sister   . Diabetes Daughter    Social History  Substance Use Topics  . Smoking status: Never Smoker  . Smokeless tobacco: Not on file  . Alcohol use No   Review of Systems  Musculoskeletal: Positive for myalgias and neck pain.  All other systems reviewed and are negative.  Objective:   Today's Vitals: BP 116/74 (BP Location: Left Arm, Patient Position: Sitting, Cuff Size: Normal)   Pulse 74   Temp 98.2 F (36.8 C) (Oral)   Ht _0  (1.575 m)   Wt 126 lb (57.2 kg)   SpO2 98%   BMI 23.05 kg/m   Physical Exam  Constitutional: She is oriented to person, place, and time. She appears well-developed and well-nourished. No distress.  HENT:  Head:  Normocephalic and atraumatic.  Nose: Nose normal.  Mouth/Throat: Oropharynx is clear and moist. No oropharyngeal exudate.  Normal TM's bilaterally.   Eyes: Conjunctivae are normal. No scleral icterus.  Neck: Neck supple. No thyromegaly present.  Cardiovascular: Normal rate and regular rhythm.   No murmur heard. Pulmonary/Chest: Effort normal and breath sounds normal. She has no wheezes. She has no rales.  Abdominal: Soft. She exhibits no distension. There is no tenderness. There is no rebound and no guarding.  Musculoskeletal: Normal range of motion. She exhibits no edema.  Lymphadenopathy:    She has no cervical adenopathy.  Neurological: She is alert and oriented to person, place, and time.  Skin: Skin is warm and dry. No rash noted.  Psychiatric: She has a normal mood and affect.  Vitals reviewed.  Assessment & Plan:   Problem List Items Addressed This Visit    Chronic neck and back pain   Relevant Medications   meloxicam (MOBIC) 15 MG tablet   PARoxetine (PAXIL) 20 MG tablet   Depression, major, recurrent, in complete remission (HCC)   Relevant Medications   PARoxetine (PAXIL) 20 MG tablet   Hypercholesteremia   Relevant Medications   lovastatin (MEVACOR) 40 MG tablet   Other Relevant Orders   Lipid Profile  Hypothyroidism   Relevant Orders   TSH   Well woman exam without gynecological exam - Primary    [place] no longer needed. Has upcoming mammogram. Will place a referral for colonoscopy. Prevnar today. Declines flu. Zostavax completed. Labs today. See orders.       Other Visit Diagnoses    Screening for deficiency anemia       Relevant Orders   CBC   Serum calcium elevated       Relevant Orders   Comp Met (CMET)   Encounter for screening colonoscopy       Relevant Orders   Ambulatory referral to Gastroenterology      Outpatient Encounter Prescriptions as of 02/22/2016  Medication Sig  . aspirin 81 MG tablet Take 81 mg by mouth daily.  .  cetirizine (ZYRTEC) 10 MG tablet Take 10 mg by mouth daily.  Marland Kitchen levothyroxine (SYNTHROID, LEVOTHROID) 112 MCG tablet TAKE ONE TABLET BY MOUTH ONCE DAILY  . lovastatin (MEVACOR) 40 MG tablet Take 1 tablet (40 mg total) by mouth daily.  . magnesium chloride (SLOW-MAG) 64 MG TBEC SR tablet Take 1 tablet by mouth.  . meloxicam (MOBIC) 15 MG tablet Take 1 tablet (15 mg total) by mouth daily.  Marland Kitchen omeprazole (PRILOSEC) 20 MG capsule Take 1 capsule (20 mg total) by mouth 2 (two) times daily.  Marland Kitchen PARoxetine (PAXIL) 20 MG tablet Take 1 tablet (20 mg total) by mouth daily.  . vitamin B-12 (CYANOCOBALAMIN) 500 MCG tablet Take 500 mcg by mouth daily.  . vitamin C (ASCORBIC ACID) 500 MG tablet Take 500 mg by mouth daily.  . [DISCONTINUED] alendronate (FOSAMAX) 35 MG tablet Take 1 tablet by mouth once a week  . [DISCONTINUED] lovastatin (MEVACOR) 40 MG tablet Take 1 tablet (40 mg total) by mouth daily.  . [DISCONTINUED] meloxicam (MOBIC) 15 MG tablet Take 1 tablet (15 mg total) by mouth daily.  . [DISCONTINUED] omeprazole (PRILOSEC) 20 MG capsule TAKE 1 CAPSULE BY MOUTH  TWICE A DAY  . [DISCONTINUED] PARoxetine (PAXIL) 20 MG tablet Take 1 tablet (20 mg total) by mouth daily.  . [DISCONTINUED] loratadine (CLARITIN) 10 MG tablet Take 10 mg by mouth daily.   No facility-administered encounter medications on file as of 02/22/2016.     Follow-up: Return for 6 months to 1 year.  [place]

## 2016-02-23 ENCOUNTER — Other Ambulatory Visit: Payer: Self-pay | Admitting: Family Medicine

## 2016-02-23 MED ORDER — LEVOTHYROXINE SODIUM 100 MCG PO TABS: 100.0000 ug | ORAL_TABLET | Freq: Every day | ORAL | 0 refills | Status: DC

## 2016-02-25 ENCOUNTER — Other Ambulatory Visit: Payer: Self-pay | Admitting: Family Medicine

## 2016-02-25 DIAGNOSIS — M549 Dorsalgia, unspecified: Principal | ICD-10-CM

## 2016-02-25 DIAGNOSIS — G8929 Other chronic pain: Principal | ICD-10-CM

## 2016-02-25 DIAGNOSIS — M542 Cervicalgia: Secondary | ICD-10-CM

## 2016-04-04 ENCOUNTER — Other Ambulatory Visit: Payer: Self-pay | Admitting: Obstetrics and Gynecology

## 2016-04-04 DIAGNOSIS — Z1231 Encounter for screening mammogram for malignant neoplasm of breast: Secondary | ICD-10-CM

## 2016-04-22 ENCOUNTER — Other Ambulatory Visit: Payer: Self-pay | Admitting: Family Medicine

## 2016-04-22 DIAGNOSIS — G8929 Other chronic pain: Secondary | ICD-10-CM

## 2016-04-22 DIAGNOSIS — M549 Dorsalgia, unspecified: Secondary | ICD-10-CM

## 2016-04-22 DIAGNOSIS — F3342 Major depressive disorder, recurrent, in full remission: Secondary | ICD-10-CM

## 2016-04-22 DIAGNOSIS — M542 Cervicalgia: Secondary | ICD-10-CM

## 2016-04-30 ENCOUNTER — Encounter: Payer: Self-pay | Admitting: Family Medicine

## 2016-05-03 ENCOUNTER — Other Ambulatory Visit: Payer: Self-pay | Admitting: Family Medicine

## 2016-05-03 DIAGNOSIS — M549 Dorsalgia, unspecified: Secondary | ICD-10-CM

## 2016-05-03 DIAGNOSIS — F3342 Major depressive disorder, recurrent, in full remission: Secondary | ICD-10-CM

## 2016-05-03 DIAGNOSIS — M542 Cervicalgia: Secondary | ICD-10-CM

## 2016-05-03 DIAGNOSIS — G8929 Other chronic pain: Secondary | ICD-10-CM

## 2016-05-03 MED ORDER — PAROXETINE HCL 20 MG PO TABS: 20.0000 mg | ORAL_TABLET | Freq: Every day | ORAL | 3 refills | Status: DC

## 2016-05-03 MED ORDER — MELOXICAM 15 MG PO TABS: 15.0000 mg | ORAL_TABLET | Freq: Every day | ORAL | 1 refills | Status: DC

## 2016-05-03 NOTE — Telephone Encounter (Signed)
Pt called and is requesting a refill on meloxicam (MOBIC) 15 MG tablet and PARoxetine (PAXIL) 20 MG tablet. She would would liek them to be sent to [facility] because it is cheaper. Please advise, thank you!   Pharmacy - [facility] on [address]  Call pt [phone]

## 2016-05-03 NOTE — Telephone Encounter (Signed)
Refilled 02/22/16. Pt last seen 02/22/16. Medication was sent to [facility]

## 2016-05-03 NOTE — Telephone Encounter (Signed)
See previous note

## 2016-05-03 NOTE — Telephone Encounter (Signed)
meloxicam was sent 02/22/16 it was sent to [facility].

## 2016-05-04 ENCOUNTER — Other Ambulatory Visit: Payer: Self-pay | Admitting: Family Medicine

## 2016-05-07 NOTE — Telephone Encounter (Signed)
Paper refill was sent over to our office from [facility] in [place], [place]. Pt was called to confirm where she wanted to send it. She confirmed [facility] was where it was needing to be sent. A verbal order was given to pharmacy and medication refilled.  Pt was apologized to for the mix up and the delay on getting her medications.

## 2016-05-09 ENCOUNTER — Ambulatory Visit
Admission: RE | Admit: 2016-05-09 | Discharge: 2016-05-09 | Disposition: A | Discharge status: Home / Self Care | Payer: BLUE CROSS/BLUE SHIELD | Source: Ambulatory Visit | Attending: Obstetrics and Gynecology | Admitting: Obstetrics and Gynecology

## 2016-05-09 DIAGNOSIS — Z1231 Encounter for screening mammogram for malignant neoplasm of breast: Secondary | ICD-10-CM | POA: Diagnosis present

## 2016-05-15 ENCOUNTER — Inpatient Hospital Stay
Admission: RE | Admit: 2016-05-15 | Discharge: 2016-05-15 | Disposition: A | Discharge status: Home / Self Care | Payer: Self-pay | Source: Ambulatory Visit | Attending: *Deleted | Admitting: *Deleted

## 2016-05-15 ENCOUNTER — Other Ambulatory Visit: Payer: Self-pay | Admitting: *Deleted

## 2016-05-15 DIAGNOSIS — Z9289 Personal history of other medical treatment: Secondary | ICD-10-CM

## 2016-06-13 ENCOUNTER — Ambulatory Visit (INDEPENDENT_AMBULATORY_CARE_PROVIDER_SITE_OTHER): Payer: BLUE CROSS/BLUE SHIELD

## 2016-06-13 ENCOUNTER — Ambulatory Visit (INDEPENDENT_AMBULATORY_CARE_PROVIDER_SITE_OTHER): Payer: BLUE CROSS/BLUE SHIELD | Admitting: Family Medicine

## 2016-06-13 ENCOUNTER — Encounter: Payer: Self-pay | Admitting: Family Medicine

## 2016-06-13 VITALS — BP 104/63 | HR 74 | Temp 97.5°F | Resp 12 | Wt 125.2 lb

## 2016-06-13 DIAGNOSIS — J209 Acute bronchitis, unspecified: Secondary | ICD-10-CM | POA: Diagnosis not present

## 2016-06-13 MED ORDER — PREDNISONE 50 MG PO TABS: ORAL_TABLET | ORAL | 0 refills | Status: DC

## 2016-06-13 MED ORDER — HYDROCOD POLST-CPM POLST ER 10-8 MG/5ML PO SUER
5.0000 mL | Freq: Two times a day (BID) | ORAL | 0 refills | Status: DC | PRN
Start: 2016-06-13 — End: 2016-07-17

## 2016-06-13 MED ORDER — ALBUTEROL SULFATE HFA 108 (90 BASE) MCG/ACT IN AERS: 2.0000 | INHALATION_SPRAY | Freq: Four times a day (QID) | RESPIRATORY_TRACT | 0 refills | Status: DC | PRN

## 2016-06-13 NOTE — Patient Instructions (Signed)
Take the medication as prescribed.  We will call with your results.  Take care  Dr. Lacinda Axon

## 2016-06-13 NOTE — Progress Notes (Signed)
Pre visit review using our clinic review tool, if applicable. No additional management support is needed unless otherwise documented below in the visit note. 

## 2016-06-13 NOTE — Assessment & Plan Note (Signed)
New acute problem. Suspect acute bronchitis given history, exam findings, and lack of fever. However, given her symptoms and age, arranging chest x-ray to rule out to be declined pneumonia. Treating with prednisone and Tussionex while awaiting chest x-ray.

## 2016-06-13 NOTE — Addendum Note (Signed)
Addended by: Coral Spikes on: 06/13/2016 09:08 AM   Modules accepted: Orders

## 2016-06-13 NOTE — Progress Notes (Addendum)
Subjective:  Patient ID: Sandra Horton, female    DOB: Jan 11, 1951  Age: 66 y.o. MRN: [id number]  CC: URI symptoms  HPI:  66 year old female presents for an acute visit with complaints of URI symptoms.  Patient reports that she's been sick for the past week. Started with sore throat and cough. She has now developed a more severe cough and associated wheezing. She has some associated pleuritic pain. No fevers or chills. She does report shortness of breath. No known exacerbating or relieving factors. No other complaints or concerns at this time.  Social Hx   Social History   Social History  . Marital status: Married    Spouse name: N/A  . Number of children: N/A  . Years of education: N/A   Social History Main Topics  . Smoking status: Never Smoker  . Smokeless tobacco: None  . Alcohol use No  . Drug use: No  . Sexual activity: Yes   Other Topics Concern  . None   Social History Narrative  . None   Review of Systems  Constitutional: Negative for fever.  HENT: Positive for sore throat.   Respiratory: Positive for cough, shortness of breath and wheezing.    Objective:  BP 104/63   Pulse 74   Temp 97.5 F (36.4 C) (Oral)   Resp 12   Wt 125 lb 3.2 oz (56.8 kg)   SpO2 99%   BMI 22.90 kg/m   BP/Weight 06/13/2016 [id number]  Systolic BP 123456 [id number]  Diastolic BP 63 74 68  Wt. (Lbs) 125.2 126 125.3  BMI 22.9 23.05 22.2   Physical Exam  Constitutional: She is oriented to person, place, and time. She appears well-developed. No distress.  HENT:  Mouth/Throat: Oropharynx is clear and moist.  Cardiovascular: Normal rate and regular rhythm.   Pulmonary/Chest: Effort normal.  Right sided wheezing noted.   Neurological: She is alert and oriented to person, place, and time.  Psychiatric: She has a normal mood and affect.  Vitals reviewed.  Lab Results  Component Value Date   WBC 3.4 (L) 02/22/2016   HGB 13.0 02/22/2016   HCT 38.0 02/22/2016   PLT 230.0  02/22/2016   GLUCOSE 83 02/22/2016   CHOL 145 02/22/2016   TRIG 76.0 02/22/2016   HDL 58.90 02/22/2016   LDLCALC 71 02/22/2016   ALT 13 02/22/2016   AST 16 02/22/2016   NA 141 02/22/2016   K 4.2 02/22/2016   CL 107 02/22/2016   CREATININE 0.97 02/22/2016   BUN 11 02/22/2016   CO2 30 02/22/2016   TSH 0.15 (L) 02/22/2016   Assessment & Plan:   Problem List Items Addressed This Visit    Acute bronchitis - Primary    New acute problem. Suspect acute bronchitis given history, exam findings, and lack of fever. However, given her symptoms and age, arranging chest x-ray to rule out to be declined pneumonia. Treating with prednisone and Tussionex while awaiting chest x-ray.      Relevant Orders   DG Chest 2 View (Completed)     Meds ordered this encounter  Medications  . predniSONE (DELTASONE) 50 MG tablet    Sig: 1 tablet daily x 5 days.    Dispense:  5 tablet    Refill:  0  . chlorpheniramine-HYDROcodone (TUSSIONEX PENNKINETIC ER) 10-8 MG/5ML SUER    Sig: Take 5 mLs by mouth every 12 (twelve) hours as needed.    Dispense:  115 mL    Refill:  0  . albuterol (PROVENTIL HFA;VENTOLIN HFA) 108 (90 Base) MCG/ACT inhaler    Sig: Inhale 2 puffs into the lungs every 6 (six) hours as needed for wheezing or shortness of breath.    Dispense:  1 Inhaler    Refill:  0   Follow-up: PRN  [place]

## 2016-07-07 ENCOUNTER — Inpatient Hospital Stay
Admission: EM | Admit: 2016-07-07 | Discharge: 2016-07-12 | DRG: 312 | Disposition: A | Discharge status: Home / Self Care | Payer: BLUE CROSS/BLUE SHIELD | Attending: Internal Medicine | Admitting: Internal Medicine

## 2016-07-07 ENCOUNTER — Emergency Department: Payer: BLUE CROSS/BLUE SHIELD

## 2016-07-07 ENCOUNTER — Encounter: Payer: Self-pay | Admitting: Emergency Medicine

## 2016-07-07 DIAGNOSIS — Z791 Long term (current) use of non-steroidal anti-inflammatories (NSAID): Secondary | ICD-10-CM

## 2016-07-07 DIAGNOSIS — R42 Dizziness and giddiness: Secondary | ICD-10-CM | POA: Diagnosis not present

## 2016-07-07 DIAGNOSIS — G8929 Other chronic pain: Secondary | ICD-10-CM | POA: Diagnosis present

## 2016-07-07 DIAGNOSIS — M549 Dorsalgia, unspecified: Secondary | ICD-10-CM

## 2016-07-07 DIAGNOSIS — Z8249 Family history of ischemic heart disease and other diseases of the circulatory system: Secondary | ICD-10-CM

## 2016-07-07 DIAGNOSIS — Z8041 Family history of malignant neoplasm of ovary: Secondary | ICD-10-CM

## 2016-07-07 DIAGNOSIS — K219 Gastro-esophageal reflux disease without esophagitis: Secondary | ICD-10-CM | POA: Diagnosis present

## 2016-07-07 DIAGNOSIS — B009 Herpesviral infection, unspecified: Secondary | ICD-10-CM | POA: Diagnosis present

## 2016-07-07 DIAGNOSIS — Z887 Allergy status to serum and vaccine status: Secondary | ICD-10-CM

## 2016-07-07 DIAGNOSIS — Z823 Family history of stroke: Secondary | ICD-10-CM

## 2016-07-07 DIAGNOSIS — M810 Age-related osteoporosis without current pathological fracture: Secondary | ICD-10-CM | POA: Diagnosis present

## 2016-07-07 DIAGNOSIS — Z833 Family history of diabetes mellitus: Secondary | ICD-10-CM

## 2016-07-07 DIAGNOSIS — J338 Other polyp of sinus: Secondary | ICD-10-CM | POA: Diagnosis present

## 2016-07-07 DIAGNOSIS — Z79891 Long term (current) use of opiate analgesic: Secondary | ICD-10-CM

## 2016-07-07 DIAGNOSIS — Z880 Allergy status to penicillin: Secondary | ICD-10-CM

## 2016-07-07 DIAGNOSIS — M8580 Other specified disorders of bone density and structure, unspecified site: Secondary | ICD-10-CM | POA: Diagnosis present

## 2016-07-07 DIAGNOSIS — Z8349 Family history of other endocrine, nutritional and metabolic diseases: Secondary | ICD-10-CM

## 2016-07-07 DIAGNOSIS — R55 Syncope and collapse: Secondary | ICD-10-CM | POA: Diagnosis not present

## 2016-07-07 DIAGNOSIS — Z7982 Long term (current) use of aspirin: Secondary | ICD-10-CM

## 2016-07-07 DIAGNOSIS — L719 Rosacea, unspecified: Secondary | ICD-10-CM | POA: Diagnosis present

## 2016-07-07 DIAGNOSIS — M542 Cervicalgia: Secondary | ICD-10-CM | POA: Diagnosis present

## 2016-07-07 DIAGNOSIS — E7800 Pure hypercholesterolemia, unspecified: Secondary | ICD-10-CM | POA: Diagnosis present

## 2016-07-07 DIAGNOSIS — F418 Other specified anxiety disorders: Secondary | ICD-10-CM | POA: Diagnosis present

## 2016-07-07 DIAGNOSIS — R2681 Unsteadiness on feet: Secondary | ICD-10-CM

## 2016-07-07 DIAGNOSIS — Z79899 Other long term (current) drug therapy: Secondary | ICD-10-CM

## 2016-07-07 DIAGNOSIS — E039 Hypothyroidism, unspecified: Secondary | ICD-10-CM | POA: Diagnosis present

## 2016-07-07 DIAGNOSIS — R110 Nausea: Secondary | ICD-10-CM | POA: Diagnosis present

## 2016-07-07 HISTORY — DX: Pure hypercholesterolemia, unspecified: E78.00

## 2016-07-07 HISTORY — DX: Dizziness and giddiness: R42

## 2016-07-07 LAB — SEDIMENTATION RATE: SED RATE: 19 mm/h (ref 0–30)

## 2016-07-07 LAB — COMPREHENSIVE METABOLIC PANEL
ALK PHOS: 43 U/L (ref 38–126)
ALT: 17 U/L (ref 14–54)
AST: 19 U/L (ref 15–41)
Albumin: 3.7 g/dL (ref 3.5–5.0)
Anion gap: 6 (ref 5–15)
BILIRUBIN TOTAL: 0.7 mg/dL (ref 0.3–1.2)
BUN: 15 mg/dL (ref 6–20)
CALCIUM: 9 mg/dL (ref 8.9–10.3)
CO2: 26 mmol/L (ref 22–32)
Chloride: 110 mmol/L (ref 101–111)
Creatinine, Ser: 0.78 mg/dL (ref 0.44–1.00)
GFR calc Af Amer: >60 mL/min (ref >60)
Glucose, Bld: 110 mg/dL — ABNORMAL HIGH (ref 65–99)
POTASSIUM: 4.3 mmol/L (ref 3.5–5.1)
Sodium: 142 mmol/L (ref 135–145)
TOTAL PROTEIN: 6.5 g/dL (ref 6.5–8.1)

## 2016-07-07 LAB — CBC WITH DIFFERENTIAL/PLATELET
BASOS ABS: 0 10*3/uL (ref 0–0.1)
Basophils Relative: 1 %
Eosinophils Absolute: 0.2 10*3/uL (ref 0–0.7)
Eosinophils Relative: 5 %
HEMATOCRIT: 35.4 % (ref 35.0–47.0)
HEMOGLOBIN: 12.3 g/dL (ref 12.0–16.0)
LYMPHS PCT: 26 %
Lymphs Abs: 0.9 10*3/uL — ABNORMAL LOW (ref 1.0–3.6)
MCH: 32.8 pg (ref 26.0–34.0)
MCHC: 34.8 g/dL (ref 32.0–36.0)
MCV: 94.1 fL (ref 80.0–100.0)
MONO ABS: 0.3 10*3/uL (ref 0.2–0.9)
MONOS PCT: 10 %
NEUTROS ABS: 2.1 10*3/uL (ref 1.4–6.5)
Neutrophils Relative %: 58 %
Platelets: 165 10*3/uL (ref 150–440)
RBC: 3.76 MIL/uL — ABNORMAL LOW (ref 3.80–5.20)
RDW: 13.4 % (ref 11.5–14.5)
WBC: 3.5 10*3/uL — ABNORMAL LOW (ref 3.6–11.0)

## 2016-07-07 LAB — URINALYSIS, COMPLETE (UACMP) WITH MICROSCOPIC
BILIRUBIN URINE: NEGATIVE
Glucose, UA: NEGATIVE mg/dL
Hgb urine dipstick: NEGATIVE
Ketones, ur: 5 mg/dL — AB
Leukocytes, UA: NEGATIVE
Nitrite: NEGATIVE
PH: 6 (ref 5.0–8.0)
Protein, ur: NEGATIVE mg/dL
SPECIFIC GRAVITY, URINE: 1.008 (ref 1.005–1.030)

## 2016-07-07 LAB — TROPONIN I

## 2016-07-07 MED ORDER — HEPARIN SODIUM (PORCINE) 5000 UNIT/ML IJ SOLN
5000.0000 [IU] | Freq: Three times a day (TID) | INTRAMUSCULAR | Status: DC
  Administered 2016-07-07 – 2016-07-11 (×11): 5000 [IU] via SUBCUTANEOUS
  Filled 2016-07-07 (×12): qty 1

## 2016-07-07 MED ORDER — ASPIRIN 81 MG PO CHEW
324.0000 mg | CHEWABLE_TABLET | Freq: Once | ORAL | Status: AC
  Administered 2016-07-07: 324 mg via ORAL
  Filled 2016-07-07: qty 4

## 2016-07-07 MED ORDER — SODIUM CHLORIDE 0.9 % IV BOLUS (SEPSIS)
1000.0000 mL | Freq: Once | INTRAVENOUS | Status: AC
  Administered 2016-07-07: 1000 mL via INTRAVENOUS

## 2016-07-07 MED ORDER — PANTOPRAZOLE SODIUM 40 MG PO TBEC
40.0000 mg | DELAYED_RELEASE_TABLET | Freq: Every day | ORAL | Status: DC
  Administered 2016-07-07 – 2016-07-11 (×5): 40 mg via ORAL
  Filled 2016-07-07 (×5): qty 1

## 2016-07-07 MED ORDER — PAROXETINE HCL 20 MG PO TABS
20.0000 mg | ORAL_TABLET | Freq: Every evening | ORAL | Status: DC
  Administered 2016-07-07 – 2016-07-11 (×5): 20 mg via ORAL
  Filled 2016-07-07 (×5): qty 1

## 2016-07-07 MED ORDER — LEVOTHYROXINE SODIUM 50 MCG PO TABS
100.0000 ug | ORAL_TABLET | Freq: Every day | ORAL | Status: DC
  Administered 2016-07-08 – 2016-07-11 (×4): 100 ug via ORAL
  Filled 2016-07-07 (×4): qty 2

## 2016-07-07 MED ORDER — VALACYCLOVIR HCL 500 MG PO TABS
500.0000 mg | ORAL_TABLET | Freq: Every day | ORAL | Status: DC
  Administered 2016-07-07 – 2016-07-11 (×5): 500 mg via ORAL
  Filled 2016-07-07 (×5): qty 1

## 2016-07-07 MED ORDER — MECLIZINE HCL 25 MG PO TABS
ORAL_TABLET | ORAL | Status: AC
  Administered 2016-07-07: 25 mg via ORAL
  Filled 2016-07-07: qty 1

## 2016-07-07 MED ORDER — ACETAMINOPHEN 325 MG PO TABS
650.0000 mg | ORAL_TABLET | Freq: Four times a day (QID) | ORAL | Status: DC | PRN
Start: 2016-07-07 — End: 2016-07-12
  Administered 2016-07-09 (×2): 650 mg via ORAL
  Filled 2016-07-07 (×2): qty 2

## 2016-07-07 MED ORDER — ONDANSETRON HCL 4 MG PO TABS
4.0000 mg | ORAL_TABLET | Freq: Four times a day (QID) | ORAL | Status: DC | PRN
  Administered 2016-07-10: 4 mg via ORAL
  Filled 2016-07-07: qty 1

## 2016-07-07 MED ORDER — SODIUM CHLORIDE 0.9% FLUSH
3.0000 mL | Freq: Two times a day (BID) | INTRAVENOUS | Status: DC
  Administered 2016-07-08 – 2016-07-11 (×6): 3 mL via INTRAVENOUS

## 2016-07-07 MED ORDER — VITAMIN B-12 1000 MCG PO TABS
500.0000 ug | ORAL_TABLET | Freq: Every day | ORAL | Status: DC
  Administered 2016-07-07 – 2016-07-11 (×5): 500 ug via ORAL
  Filled 2016-07-07 (×5): qty 1

## 2016-07-07 MED ORDER — ALBUTEROL SULFATE (2.5 MG/3ML) 0.083% IN NEBU: 2.5000 mg | INHALATION_SOLUTION | Freq: Four times a day (QID) | RESPIRATORY_TRACT | Status: DC | PRN

## 2016-07-07 MED ORDER — PRAVASTATIN SODIUM 40 MG PO TABS
40.0000 mg | ORAL_TABLET | Freq: Every day | ORAL | Status: DC
  Administered 2016-07-07 – 2016-07-11 (×5): 40 mg via ORAL
  Filled 2016-07-07 (×5): qty 1

## 2016-07-07 MED ORDER — DIAZEPAM 2 MG PO TABS
2.0000 mg | ORAL_TABLET | Freq: Three times a day (TID) | ORAL | Status: DC
  Administered 2016-07-07 – 2016-07-08 (×2): 2 mg via ORAL
  Filled 2016-07-07 (×4): qty 1

## 2016-07-07 MED ORDER — MELOXICAM 7.5 MG PO TABS
15.0000 mg | ORAL_TABLET | Freq: Every day | ORAL | Status: DC
  Administered 2016-07-08 – 2016-07-11 (×4): 15 mg via ORAL
  Filled 2016-07-07 (×4): qty 2

## 2016-07-07 MED ORDER — MECLIZINE HCL 25 MG PO TABS
25.0000 mg | ORAL_TABLET | Freq: Once | ORAL | Status: AC
  Administered 2016-07-07: 25 mg via ORAL

## 2016-07-07 MED ORDER — VITAMIN C 500 MG PO TABS
500.0000 mg | ORAL_TABLET | Freq: Every day | ORAL | Status: DC
  Administered 2016-07-07 – 2016-07-11 (×5): 500 mg via ORAL
  Filled 2016-07-07 (×5): qty 1

## 2016-07-07 MED ORDER — BISACODYL 10 MG RE SUPP: 10.0000 mg | Freq: Every day | RECTAL | Status: DC | PRN

## 2016-07-07 MED ORDER — ASPIRIN EC 81 MG PO TBEC
81.0000 mg | DELAYED_RELEASE_TABLET | Freq: Every day | ORAL | Status: DC
  Administered 2016-07-08 – 2016-07-11 (×4): 81 mg via ORAL
  Filled 2016-07-07 (×5): qty 1

## 2016-07-07 MED ORDER — MAGNESIUM CHLORIDE 64 MG PO TBEC
1.0000 | DELAYED_RELEASE_TABLET | Freq: Every day | ORAL | Status: DC
  Administered 2016-07-07 – 2016-07-11 (×5): 64 mg via ORAL
  Filled 2016-07-07 (×6): qty 1

## 2016-07-07 MED ORDER — LORATADINE 10 MG PO TABS
10.0000 mg | ORAL_TABLET | Freq: Every day | ORAL | Status: DC
  Administered 2016-07-07 – 2016-07-11 (×5): 10 mg via ORAL
  Filled 2016-07-07 (×5): qty 1

## 2016-07-07 MED ORDER — DOCUSATE SODIUM 100 MG PO CAPS
100.0000 mg | ORAL_CAPSULE | Freq: Two times a day (BID) | ORAL | Status: DC
  Administered 2016-07-07 – 2016-07-11 (×6): 100 mg via ORAL
  Filled 2016-07-07 (×8): qty 1

## 2016-07-07 MED ORDER — ACETAMINOPHEN 650 MG RE SUPP: 650.0000 mg | Freq: Four times a day (QID) | RECTAL | Status: DC | PRN

## 2016-07-07 MED ORDER — MELOXICAM 7.5 MG PO TABS
15.0000 mg | ORAL_TABLET | ORAL | Status: AC
  Administered 2016-07-07: 15 mg via ORAL
  Filled 2016-07-07: qty 2

## 2016-07-07 MED ORDER — POLYETHYLENE GLYCOL 3350 17 G PO PACK
17.0000 g | PACK | Freq: Every day | ORAL | Status: DC
  Administered 2016-07-07 – 2016-07-11 (×3): 17 g via ORAL
  Filled 2016-07-07 (×4): qty 1

## 2016-07-07 MED ORDER — LORAZEPAM 2 MG/ML IJ SOLN
1.0000 mg | Freq: Once | INTRAMUSCULAR | Status: AC
  Administered 2016-07-07: 1 mg via INTRAVENOUS
  Filled 2016-07-07: qty 1

## 2016-07-07 MED ORDER — ONDANSETRON HCL 4 MG/2ML IJ SOLN: 4.0000 mg | Freq: Four times a day (QID) | INTRAMUSCULAR | Status: DC | PRN

## 2016-07-07 MED ORDER — ALBUTEROL SULFATE HFA 108 (90 BASE) MCG/ACT IN AERS: 2.0000 | INHALATION_SPRAY | Freq: Four times a day (QID) | RESPIRATORY_TRACT | Status: DC | PRN

## 2016-07-07 MED ORDER — SODIUM CHLORIDE 0.9 % IV SOLN
INTRAVENOUS | Status: DC
  Administered 2016-07-07 – 2016-07-08 (×3): via INTRAVENOUS

## 2016-07-07 NOTE — ED Provider Notes (Signed)
[facility] Emergency Department Provider Note  ____________________________________________   First MD Initiated Contact with Patient 07/07/16 [id number]     (approximate)  I have reviewed the triage vital signs and the nursing notes.   HISTORY  Chief Complaint Near Syncope   HPI Sandra Horton is a 66 y.o. female with a history of vertigo as well as thyroid disease was presenting with lightheadedness as well as nausea and a visual disturbance this morning started after waking up. She says that she had an episode of vertigo several years ago that felt more like the room was spinning that felt different than this. Said that she had bronchitis several weeks ago but otherwise has not been having any ear pressure or pain or ringing in the ears. She says that when she lies flat all the sensations are gone and she feels normal. However, when she sits up she starts at visual disturbance as well as nausea and lightheadedness.   Past Medical History:  Diagnosis Date  . Allergy   . Anxiety   . Depression   . GERD (gastroesophageal reflux disease)   . High cholesterol   . Hyperlipidemia   . Osteoporosis   . Rosacea   . Thyroid disease   . Vertigo     Patient Active Problem List   Diagnosis Date Noted  . Acute bronchitis 06/13/2016  . Osteopenia 09/05/2014  . Vitamin D deficiency 09/05/2014  . Hypothyroidism 09/05/2014  . Chronic neck and back pain 09/05/2014  . Hyperlipidemia 09/05/2014  . Rosacea 09/05/2014  . Allergic rhinitis 09/05/2014  . Arthritis 03/14/2014  . Depression, major, recurrent, in complete remission ([place]) 08/27/2007    Past Surgical History:  Procedure Laterality Date  . COLONOSCOPY    . DILATION AND CURETTAGE OF UTERUS  30 years ago    Prior to Admission medications   Medication Sig Start Date End Date Taking? Authorizing Provider  albuterol (PROVENTIL HFA;VENTOLIN HFA) 108 (90 Base) MCG/ACT inhaler Inhale 2 puffs into the lungs  every 6 (six) hours as needed for wheezing or shortness of breath. 06/13/16   Coral Spikes, DO  aspirin 81 MG tablet Take 81 mg by mouth daily.    Historical Provider, MD  cetirizine (ZYRTEC) 10 MG tablet Take 10 mg by mouth daily.    Historical Provider, MD  chlorpheniramine-HYDROcodone (TUSSIONEX PENNKINETIC ER) 10-8 MG/5ML SUER Take 5 mLs by mouth every 12 (twelve) hours as needed. 06/13/16   Coral Spikes, DO  levothyroxine (SYNTHROID, LEVOTHROID) 100 MCG tablet TAKE ONE TABLET BY MOUTH ONCE DAILY 05/06/16   Coral Spikes, DO  lovastatin (MEVACOR) 40 MG tablet Take 1 tablet (40 mg total) by mouth daily. 02/22/16   Coral Spikes, DO  magnesium chloride (SLOW-MAG) 64 MG TBEC SR tablet Take 1 tablet by mouth.    Historical Provider, MD  meloxicam (MOBIC) 15 MG tablet Take 1 tablet (15 mg total) by mouth daily. 05/03/16   Coral Spikes, DO  omeprazole (PRILOSEC) 20 MG capsule Take 1 capsule (20 mg total) by mouth 2 (two) times daily. 02/22/16   Coral Spikes, DO  PARoxetine (PAXIL) 20 MG tablet Take 1 tablet (20 mg total) by mouth daily. 05/03/16   Coral Spikes, DO  predniSONE (DELTASONE) 50 MG tablet 1 tablet daily x 5 days. 06/13/16   Coral Spikes, DO  vitamin B-12 (CYANOCOBALAMIN) 500 MCG tablet Take 500 mcg by mouth daily.    Historical Provider, MD  vitamin C (ASCORBIC  ACID) 500 MG tablet Take 500 mg by mouth daily.    Historical Provider, MD    Allergies Penicillin v potassium; Penicillins; and Tetanus toxoid  Family History  Problem Relation Age of Onset  . Cancer Mother   . Diabetes Mother   . Thyroid disease Mother   . Hypertension Father   . Stroke Father   . Thyroid disease Sister   . Ovarian cancer Sister   . Diabetes Daughter     Social History Social History  Substance Use Topics  . Smoking status: Never Smoker  . Smokeless tobacco: Never Used  . Alcohol use No    Review of Systems Constitutional: No fever/chills Eyes: No visual changes. ENT: No sore throat. Cardiovascular:  Denies chest pain. Respiratory: Denies shortness of breath. Gastrointestinal: No abdominal pain.  no vomiting.  No diarrhea.  No constipation. Genitourinary: Negative for dysuria. Musculoskeletal: Negative for back pain. Skin: Negative for rash. Neurological: Negative for headaches, focal weakness. Says that she felt a numbness to all 4 extremities this morning during the episode but denies the sensation of this time.  10-point ROS otherwise negative.  ____________________________________________   PHYSICAL EXAM:  VITAL SIGNS: ED Triage Vitals  Enc Vitals Group     BP 07/07/16 0853 138/81     Pulse Rate 07/07/16 0853 68     Resp 07/07/16 0853 14     Temp --      Temp src --      SpO2 07/07/16 0853 100 %     Weight 07/07/16 0855 130 lb (59 kg)     Height 07/07/16 0855 5\' 4"  (1.626 m)     Head Circumference --      Peak Flow --      Pain Score 07/07/16 0851 0     Pain Loc --      Pain Edu? --      Excl. in [place]? --     Constitutional: Alert and oriented. Well appearing and in no acute distress. Eyes: Conjunctivae are normal. PERRL. EOMI. Head: Atraumatic.Small amount of clear fluid behind the left tympanic membrane. Right tympanic membrane is normal. Nose: No congestion/rhinnorhea. Mouth/Throat: Mucous membranes are moist.   Neck: No stridor.   Cardiovascular: Normal rate, regular rhythm. Grossly normal heart sounds.   Respiratory: Normal respiratory effort.  No retractions. Lungs CTAB. Gastrointestinal: Soft and nontender. No distention.  Musculoskeletal: No lower extremity tenderness nor edema.  No joint effusions. Neurologic:  Normal speech and language. No gross focal neurologic deficits are appreciated. No ataxia on finger to nose testing. No ataxia on heel-to-shin testing. No nystagmus.  When patient turns her head to the left while lying flat she also gets these sensations that she presented with this morning.  Skin:  Skin is warm, dry and intact. No rash  noted. Psychiatric: Mood and affect are normal. Speech and behavior are normal.  ____________________________________________   LABS (all labs ordered are listed, but only abnormal results are displayed)  Labs Reviewed  CBC WITH DIFFERENTIAL/PLATELET  COMPREHENSIVE METABOLIC PANEL  TROPONIN I   ____________________________________________  EKG  ED ECG REPORT I, [place],  Youlanda Roys, the attending physician, personally viewed and interpreted this ECG.   Date: 07/07/2016  EKG Time: 0903  Rate: 56  Rhythm: normal sinus rhythm  Axis: normal   Intervals:none  ST&T Change: No ST segment elevation or depression. No abnormal T-wave inversion.  ____________________________________________  RADIOLOGY    CT Head Wo Contrast (Final result)  Result time 07/07/16 13:48:24  Final result by Gaspar Cola, MD (07/07/16 13:48:24)           Narrative:   CLINICAL DATA: 66 year old female with vertigo. Nausea and dizziness. Near syncope. Symptom onset this morning. Initial encounter.  EXAM: CT HEAD WITHOUT CONTRAST  TECHNIQUE: Contiguous axial images were obtained from the base of the skull through the vertex without intravenous contrast.  COMPARISON: Brain MRI 03/24/2014. Head CT 03/04/2014.  FINDINGS: Brain: Cerebral volume remains normal for age. No midline shift, ventriculomegaly, mass effect, evidence of mass lesion, intracranial hemorrhage or evidence of cortically based acute infarction. Gray-white matter differentiation is within normal limits throughout the brain.  Vascular: Mild Calcified atherosclerosis at the skull base.  Skull: Stable. No acute osseous abnormality identified.  Sinuses/Orbits: Chronic posterior right nasal cavity polyp suspected, measuring about 17 mm where it protrudes into the anterior nasopharynx (series 3, image 1). This appears not significantly changed since 2015. Similar partially calcified polypoid lesion in the posterosuperior  left nasal cavity, and associated Chronic left sphenoid sinusitis. Other paranasal sinuses are stable in clear. Bilateral tympanic cavities and mastoids appear clear.  Other: No acute orbit or scalp soft tissue findings.  IMPRESSION: 1. Stable and normal noncontrast CT appearance of the brain. 2. 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 Recommend ENT referral.   Electronically Signed By: Genevie Ann M.D. On: 07/07/2016 13:48          ____________________________________________   PROCEDURES  Procedure(s) performed:   Procedures  Critical Care performed:   ____________________________________________   INITIAL IMPRESSION / ASSESSMENT AND PLAN / ED COURSE  Pertinent labs & imaging results that were available during my care of the patient were reviewed by me and considered in my medical decision making (see chart for details).  ----------------------------------------- 3:14 PM on 07/07/2016 -----------------------------------------  Patient says that she feels improved after meclizine as well as Ativan but still after she pulled herself up to a sitting from a lying position in bed feels lightheaded. When she pulled himself up immediately lowers herself back down to a lying down position. Still without any objective neurological findings. Symptoms still completely resolved when still. Because patient is unable to get up from lying position she'll be admitted to the hospital for further observation and further workup. Signed out to Dr. Doy Hutching.      ____________________________________________   FINAL CLINICAL IMPRESSION(S) / ED DIAGNOSES   Vertigo. Near-syncope.   NEW MEDICATIONS STARTED DURING THIS VISIT:  New Prescriptions   No medications on file     Note:  This document was prepared using Dragon  voice recognition software and may include unintentional dictation errors.    Orbie Pyo, MD 07/07/16 [phone]

## 2016-07-07 NOTE — H&P (Signed)
History and Physical    Sandra Horton [id number] DOB: 10-Mar-1951 DOA: 07/07/2016  Referring physician: Dr. Clearnce Hasten PCP: Coral Spikes, DO  Specialists: none  Chief Complaint: near syncope with vertigo  HPI: ESA CADD is a 66 y.o. female has a past medical history significant for anxiety/depression, chronic neck and back pain, and thyroid disease now with acute onset near syncope associated with positional vertigo. No focal numbness or weakness. No change in speech or swallowing. Sx's worse with movement. Some relief with Ativan given in ER. Head CT negative. VSS. She is now admitted  Review of Systems: The patient denies anorexia, fever, weight loss,, vision loss, decreased hearing, hoarseness, chest pain, syncope, dyspnea on exertion, peripheral edema, balance deficits, hemoptysis, abdominal pain, melena, hematochezia, severe indigestion/heartburn, hematuria, incontinence, genital sores, muscle weakness, suspicious skin lesions, transient blindness, difficulty walking, depression, unusual weight change, abnormal bleeding, enlarged lymph nodes, angioedema, and breast masses.   Past Medical History:  Diagnosis Date  . Allergy   . Anxiety   . Depression   . GERD (gastroesophageal reflux disease)   . High cholesterol   . Hyperlipidemia   . Osteoporosis   . Rosacea   . Thyroid disease   . Vertigo    Past Surgical History:  Procedure Laterality Date  . COLONOSCOPY    . DILATION AND CURETTAGE OF UTERUS  30 years ago   Social History:  reports that she has never smoked. She has never used smokeless tobacco. She reports that she does not drink alcohol or use drugs.  Allergies  Allergen Reactions  . Penicillin V Potassium Nausea And Vomiting  . Penicillins   . Tetanus Toxoid Nausea And Vomiting    Family History  Problem Relation Age of Onset  . Cancer Mother   . Diabetes Mother   . Thyroid disease Mother   . Hypertension Father   . Stroke Father   . Thyroid disease  Sister   . Ovarian cancer Sister   . Diabetes Daughter     Prior to Admission medications   Medication Sig Start Date End Date Taking? Authorizing Provider  albuterol (PROVENTIL HFA;VENTOLIN HFA) 108 (90 Base) MCG/ACT inhaler Inhale 2 puffs into the lungs every 6 (six) hours as needed for wheezing or shortness of breath. 06/13/16   Coral Spikes, DO  aspirin 81 MG tablet Take 81 mg by mouth daily.    Historical Provider, MD  cetirizine (ZYRTEC) 10 MG tablet Take 10 mg by mouth daily.    Historical Provider, MD  chlorpheniramine-HYDROcodone (TUSSIONEX PENNKINETIC ER) 10-8 MG/5ML SUER Take 5 mLs by mouth every 12 (twelve) hours as needed. 06/13/16   Coral Spikes, DO  levothyroxine (SYNTHROID, LEVOTHROID) 100 MCG tablet TAKE ONE TABLET BY MOUTH ONCE DAILY 05/06/16   Coral Spikes, DO  lovastatin (MEVACOR) 40 MG tablet Take 1 tablet (40 mg total) by mouth daily. 02/22/16   Coral Spikes, DO  magnesium chloride (SLOW-MAG) 64 MG TBEC SR tablet Take 1 tablet by mouth.    Historical Provider, MD  meloxicam (MOBIC) 15 MG tablet Take 1 tablet (15 mg total) by mouth daily. 05/03/16   Coral Spikes, DO  omeprazole (PRILOSEC) 20 MG capsule Take 1 capsule (20 mg total) by mouth 2 (two) times daily. 02/22/16   Coral Spikes, DO  PARoxetine (PAXIL) 20 MG tablet Take 1 tablet (20 mg total) by mouth daily. 05/03/16   Coral Spikes, DO  predniSONE (DELTASONE) 50 MG tablet 1 tablet daily  x 5 days. 06/13/16   Coral Spikes, DO  vitamin B-12 (CYANOCOBALAMIN) 500 MCG tablet Take 500 mcg by mouth daily.    Historical Provider, MD  vitamin C (ASCORBIC ACID) 500 MG tablet Take 500 mg by mouth daily.    Historical Provider, MD   Physical Exam: Vitals:   07/07/16 1400 07/07/16 1415 07/07/16 1430 07/07/16 1445  BP: 100/63 (!) 99/59 100/61 105/63  Pulse: 70 70 70 71  Resp: 13 14 16 13   SpO2: 96% 96% 95% 96%  Weight:      Height:         General:  No apparent distress, WDWN, [place]/AT  Eyes: PERRL, EOMI, no scleral icterus,  conjunctiva clear  ENT: moist oropharynx without exudate, TM's benign, dentition good  Neck: supple, no lymphadenopathy. No bruits or thyromegaly  Cardiovascular: regular rate without MRG; 2+ peripheral pulses, no JVD, no peripheral edema  Respiratory: CTA biL, good air movement without wheezing, rhonchi or crackled. Respiratory effort normal  Abdomen: soft, non tender to palpation, positive bowel sounds, no guarding, no rebound  Skin: no rashes or lesions  Musculoskeletal: normal bulk and tone, no joint swelling  Psychiatric: normal mood and affect, A&OX3  Neurologic: CN 2-12 grossly intact, Motor strength 5/5 in all 4 groups with symmetric DTR's and non-focal sensory exam  Labs on Admission:  Basic Metabolic Panel:  Recent Labs Lab 07/07/16 0859  NA 142  K 4.3  CL 110  CO2 26  GLUCOSE 110*  BUN 15  CREATININE 0.78  CALCIUM 9.0   Liver Function Tests:  Recent Labs Lab 07/07/16 0859  AST 19  ALT 17  ALKPHOS 43  BILITOT 0.7  PROT 6.5  ALBUMIN 3.7   No results for input(s): LIPASE, AMYLASE in the last 168 hours. No results for input(s): AMMONIA in the last 168 hours. CBC:  Recent Labs Lab 07/07/16 0859  WBC 3.5*  NEUTROABS 2.1  HGB 12.3  HCT 35.4  MCV 94.1  PLT 165   Cardiac Enzymes:  Recent Labs Lab 07/07/16 0859  TROPONINI <0.03    BNP (last 3 results) No results for input(s): BNP in the last 8760 hours.  ProBNP (last 3 results) No results for input(s): PROBNP in the last 8760 hours.  CBG: No results for input(s): GLUCAP in the last 168 hours.  Radiological Exams on Admission: Ct Head Wo Contrast  Result Date: 07/07/2016 CLINICAL DATA:  66 year old female with vertigo. Nausea and dizziness. Near syncope. Symptom onset this morning. Initial encounter. EXAM: CT HEAD WITHOUT CONTRAST TECHNIQUE: Contiguous axial images were obtained from the base of the skull through the vertex without intravenous contrast. COMPARISON:  Brain MRI 03/24/2014.   Head CT 03/04/2014. FINDINGS: Brain: Cerebral volume remains normal for age. No midline shift, ventriculomegaly, mass effect, evidence of mass lesion, intracranial hemorrhage or evidence of cortically based acute infarction. Gray-white matter differentiation is within normal limits throughout the brain. Vascular: Mild Calcified atherosclerosis at the skull base. Skull: Stable.  No acute osseous abnormality identified. Sinuses/Orbits: Chronic posterior right nasal cavity polyp suspected, measuring about 17 mm where it protrudes into the anterior nasopharynx (series 3, image 1). This appears not significantly changed since 2015. Similar partially calcified polypoid lesion in the posterosuperior left nasal cavity, and associated Chronic left sphenoid sinusitis. Other paranasal sinuses are stable in clear. Bilateral tympanic cavities and mastoids appear clear. Other: No acute orbit or scalp soft tissue findings. IMPRESSION: 1. Stable and normal noncontrast CT appearance of the brain. 2. 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 Recommend ENT referral. Electronically Signed   By: Genevie Ann M.D.   On: 07/07/2016 13:48    EKG: Independently reviewed.  Assessment/Plan Principal Problem:   Near syncope Active Problems:   Hypothyroidism   Chronic neck and back pain   Vertigo   Will observe on floor with IV fluids and begin po Valium. Carotid US and MRI of brain ordered. Neuro checks q4h. Consult ENT. PT consult also. Repeat labs in AM.  Diet: heart healthy Fluids: NS@75  DVT Prophylaxis: SQ Heparin  Code Status: FULL  Family Communication: yes  Disposition Plan: home  Time spent: 50 min

## 2016-07-07 NOTE — ED Triage Notes (Addendum)
Arrives via [facility] for c/o near syncope this morning after waking up and walking to bathroom.  Patient also c/o nausea.  EMS started 18 g IV to LAC, 500 cc NS, and 4 mg Zofran given by EMS.  Patient has history of Vertigo, but states this is a different feeling.  Patient states she feels very dizzy when sitting up or changing position of head.  Most comfortable currently laying flat, head center, gaze straight ahead.

## 2016-07-08 ENCOUNTER — Observation Stay: Payer: BLUE CROSS/BLUE SHIELD

## 2016-07-08 LAB — CBC
HCT: 34.7 % — ABNORMAL LOW (ref 35.0–47.0)
Hemoglobin: 11.6 g/dL — ABNORMAL LOW (ref 12.0–16.0)
MCH: 32.2 pg (ref 26.0–34.0)
MCHC: 33.6 g/dL (ref 32.0–36.0)
MCV: 95.9 fL (ref 80.0–100.0)
PLATELETS: 150 10*3/uL (ref 150–440)
RBC: 3.62 MIL/uL — ABNORMAL LOW (ref 3.80–5.20)
RDW: 13.2 % (ref 11.5–14.5)
WBC: 3.4 10*3/uL — ABNORMAL LOW (ref 3.6–11.0)

## 2016-07-08 LAB — COMPREHENSIVE METABOLIC PANEL
ALK PHOS: 42 U/L (ref 38–126)
ALT: 15 U/L (ref 14–54)
ANION GAP: 6 (ref 5–15)
AST: 17 U/L (ref 15–41)
Albumin: 3.2 g/dL — ABNORMAL LOW (ref 3.5–5.0)
BUN: 13 mg/dL (ref 6–20)
CALCIUM: 8.6 mg/dL — AB (ref 8.9–10.3)
CO2: 22 mmol/L (ref 22–32)
Chloride: 113 mmol/L — ABNORMAL HIGH (ref 101–111)
Creatinine, Ser: 0.66 mg/dL (ref 0.44–1.00)
GFR calc non Af Amer: >60 mL/min (ref >60)
Glucose, Bld: 66 mg/dL (ref 65–99)
Potassium: 3.6 mmol/L (ref 3.5–5.1)
Sodium: 141 mmol/L (ref 135–145)
Total Bilirubin: 0.9 mg/dL (ref 0.3–1.2)
Total Protein: 5.8 g/dL — ABNORMAL LOW (ref 6.5–8.1)

## 2016-07-08 LAB — GLUCOSE, CAPILLARY
GLUCOSE-CAPILLARY: 62 mg/dL — AB (ref 65–99)
GLUCOSE-CAPILLARY: 128 mg/dL — AB (ref 65–99)
Glucose-Capillary: 57 mg/dL — ABNORMAL LOW (ref 65–99)
Glucose-Capillary: 148 mg/dL — ABNORMAL HIGH (ref 65–99)

## 2016-07-08 NOTE — Evaluation (Signed)
Physical Therapy Evaluation Patient Details Name: Sandra Horton MRN: [id number] DOB: 10-05-1950 Today's Date: 07/08/2016   History of Present Illness  Sandra Horton is a 66 y.o. female has a past medical history significant for anxiety/depression, chronic neck and back pain, and thyroid disease now with acute onset near syncope associated with positional vertigo. No focal numbness or weakness. No change in speech or swallowing. Sx's worse with movement. Some relief with Ativan given in ER. Head CT negative. VSS. She is now admitted  Clinical Impression  Pt admitted with above diagnosis. Pt currently with functional limitations due to the deficits listed below (see PT Problem List).  Pt denies symptoms of vertigo or dizziness and reports feeling primarily like she is lightheaded and is going to "black out." Denies auditory or visual changes. Denies slurring of speech or difficulty swallowing. Reports bronchitis 3 weeks ago but otherwise no changes in health. Strength and sensation intact bilaterally. Negative pronator drift, facial symmetry intact, and finger to nose normal. Rapid alternating movements WNL. EOM full and no nystagmus during finger follow except appropriate at end range. Smooth pursuit and saccades normal. VOR, VOR cancellation, and VOR thrust negative. Horizontal canal and Dix-Hallpike testing negative for BPPV. No abnormal neurological or vestibular findings during examination. Pt does report reproduction of symptoms going from supine to sitting and sitting to standing. Orthostatic vitals obtained and are negative. Pt has intermittent unsteadiness during ambulation and encouraged use of rolling walker at discharge which she has at home. If symptoms do not resolve by discharge she may benefit from OP PT to work on balance. However this does not appear to be a physical therapy related issue as the issues with her balance are acute onset. At baseline she is fully independent and works as a Higher education careers adviser.  Pt will benefit from skilled PT services to address deficits in strength, balance, and mobility in order to return to full function at home.     Follow Up Recommendations Outpatient PT;Other (comment) (If symptoms of unsteadiness persist at discharge)    Equipment Recommendations  None recommended by PT;Other (comment) (Pt should use RW at discharge for added stability)    Recommendations for Other Services       Precautions / Restrictions Precautions Precautions: Fall Restrictions Weight Bearing Restrictions: No      Mobility  Bed Mobility Overal bed mobility: Needs Assistance Bed Mobility: Sit to Supine;Supine to Sit     Supine to sit: Min assist Sit to supine: Supervision   General bed mobility comments: MinA+1 to come to sitting due to symptoms of feeling "lightheaded" when changing positions. Once sitting upright at EOB pt requires extended time for symptoms to improve  Transfers Overall transfer level: Needs assistance Equipment used: Rolling walker (2 wheeled) Transfers: Sit to/from Stand Sit to Stand: Min guard         General transfer comment: Pt requires continual support due to worsening of lightheadedness when standing. She reports that her vision darkens and she feels like she is going to black out. Pt never loses consciousness and orthostatic vitals are negative. After extended period in standing symptoms improve and pt able to walk. Toilet transfers performed with patient and she requires assist due to instability   Ambulation/Gait Ambulation/Gait assistance: Min assist;+2 safety/equipment Ambulation Distance (Feet): 40 Feet Assistive device: Rolling walker (2 wheeled)   Gait velocity: Decreased Gait velocity interpretation: <1.8 ft/sec, indicative of risk for recurrent falls General Gait Details: Pt ambulates into bathroom and then out into hallway  and back to bed. When she gets out in the hallway she reports feeling like she is going to "blackout."  MinA+1 support provided to stabilize with +2 present for added safety. Pt never loses consciousness but states that she cannot ambulate any further at this time so returned to bed  Stairs            Wheelchair Mobility    Modified Rankin (Stroke Patients Only)       Balance Overall balance assessment: Needs assistance Sitting-balance support: No upper extremity supported Sitting balance-Leahy Scale: Good     Standing balance support: No upper extremity supported Standing balance-Leahy Scale: Fair                               Pertinent Vitals/Pain Pain Assessment: No/denies pain    Home Living Family/patient expects to be discharged to:: Private residence Living Arrangements: Spouse/significant other Available Help at Discharge: Family Type of Home: House Home Access: Stairs to enter Entrance Stairs-Rails: None Entrance Stairs-Number of Steps: 1 Home Layout: Multi-level;Other (Comment) (Split level house) Home Equipment: Walker - 2 wheels;Shower seat      Prior Function Level of Independence: Independent         Comments: Ambulates full community distances without assistive devices. 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, newspaper   Dominant Hand: Right    Extremity/Trunk Assessment   Upper Extremity Assessment Upper Extremity Assessment: Overall WFL for tasks assessed;RUE deficits/detail RUE Deficits / Details: Denies bilateral UE/LE numbness/tingling    Lower Extremity Assessment Lower Extremity Assessment: Overall WFL for tasks assessed;RLE deficits/detail       Communication   Communication: No difficulties  Cognition Arousal/Alertness: Awake/alert Behavior During Therapy: WFL for tasks assessed/performed Overall Cognitive Status: Within Functional Limits for tasks assessed                      General Comments      Exercises     Assessment/Plan    PT Assessment Patient needs continued PT services  PT  Problem List Decreased balance          PT Treatment Interventions DME instruction;Gait training;Stair training;Functional mobility training;Therapeutic activities;Neuromuscular re-education;Balance training;Therapeutic exercise;Patient/family education    PT Goals (Current goals can be found in the Care Plan section)  Acute Rehab PT Goals Patient Stated Goal: Return to prior function at home PT Goal Formulation: With patient/family Time For Goal Achievement: 07/22/16 Potential to Achieve Goals: Good    Frequency Min 2X/week   Barriers to discharge        Co-evaluation               End of Session Equipment Utilized During Treatment: Gait belt Activity Tolerance: Treatment limited secondary to medical complications (Comment);Other (comment) (Limited due to feeling lightheaded) Patient left: in bed;with call bell/phone within reach;with bed alarm set;with family/visitor present      Functional Assessment Tool Used: clinical judgement Functional Limitation: Mobility: Walking and moving around Mobility: Walking and Moving Around Current Status [id number]): At least 40 percent but less than 60 percent impaired, limited or restricted Mobility: Walking and Moving Around Goal Status [id number]): At least 1 percent but less than 20 percent impaired, limited or restricted    Time: 0920-1000 PT Time Calculation (min) (ACUTE ONLY): 40 min   Charges:   PT Evaluation $PT Eval Moderate Complexity: 1 Procedure PT Treatments $Therapeutic Activity: 8-22 mins  PT G Codes:   PT G-Codes **NOT FOR INPATIENT CLASS** Functional Assessment Tool Used: clinical judgement Functional Limitation: Mobility: Walking and moving around Mobility: Walking and Moving Around Current Status [id number]): At least 40 percent but less than 60 percent impaired, limited or restricted Mobility: Walking and Moving Around Goal Status [phone]): At least 1 percent but less than 20 percent impaired, limited or restricted    Phillips Grout PT, DPT  p Vada Swift 07/08/2016, 11:20 AM

## 2016-07-08 NOTE — Op Note (Signed)
[id number]  [id number] PM    Romanek, Vaughan Basta  [id number]   Pre-Op Dx:  Nasal polyps and left-sided sphenoid mucosal swelling  Post-op Dx: Small Nasal polyps with no sign of acute inflammation and no evidence for obstruction in the nose  Proc: Flexible laryngoscopy   Surg:  Baruch Lewers H  Anes:  GOT  EBL:  None  Comp:  None  Findings:  Polypoid changes the right inferior turbinate at its posterior and. Posterior septal spur to the right side. Small polyp off the middle turbinate and blockage of the middle meatus on the left side. Inferior airway is open but the superior airway seems to be blocked  Procedure: The nose sprayed with the phenylephrine and Xylocaine for vasoconstriction and topical anesthesia. Flexible nasal endoscope was placed into both nostrils for visualization. Findings are noted above. The procedure well  Dispo:   This was done at the bedside without any difficulties  Plan:  The patient will plan to follow-up office in about a month for further evaluation of the nose and sinuses.  Briant Angelillo H  07/08/2016 7:07 PM

## 2016-07-08 NOTE — Consult Note (Signed)
[id number] 0000000 PM  Frayre, Vaughan Basta [id number]   Note: I did a consult earlier today and return for reevaluation of her sinuses with a flexible scope    Temp:  [97.6 F (36.4 C)-98.1 F (36.7 C)] 98.1 F (36.7 C) (02/05 1233) Pulse Rate:  [67-89] 70 (02/05 1233) Resp:  [16-20] 20 (02/05 1233) BP: (96-123)/(47-66) 112/56 (02/05 1233) SpO2:  [97 %-100 %] 99 % (02/05 1233) Weight:  [61.1 kg (134 lb 9.6 oz)] 61.1 kg (134 lb 9.6 oz) (02/05 0500),     Intake/Output Summary (Last 24 hours) at 07/08/16 1859 Last data filed at 07/08/16 1621  Gross per 24 hour  Intake             2282 ml  Output              900 ml  Net             1382 ml    Results for orders placed or performed during the hospital encounter of 07/07/16 (from the past 24 hour(s))  Sedimentation rate     Status: None   Collection Time: 07/07/16  7:14 PM  Result Value Ref Range   Sed Rate 19 0 - 30 mm/hr  CBC     Status: Abnormal   Collection Time: 07/08/16  5:25 AM  Result Value Ref Range   WBC 3.4 (L) 3.6 - 11.0 K/uL   RBC 3.62 (L) 3.80 - 5.20 MIL/uL   Hemoglobin 11.6 (L) 12.0 - 16.0 g/dL   HCT 34.7 (L) 35.0 - 47.0 %   MCV 95.9 80.0 - 100.0 fL   MCH 32.2 26.0 - 34.0 pg   MCHC 33.6 32.0 - 36.0 g/dL   RDW 13.2 11.5 - 14.5 %   Platelets 150 150 - 440 K/uL  Comprehensive metabolic panel     Status: Abnormal   Collection Time: 07/08/16  5:25 AM  Result Value Ref Range   Sodium 141 135 - 145 mmol/L   Potassium 3.6 3.5 - 5.1 mmol/L   Chloride 113 (H) 101 - 111 mmol/L   CO2 22 22 - 32 mmol/L   Glucose, Bld 66 65 - 99 mg/dL   BUN 13 6 - 20 mg/dL   Creatinine, Ser 0.66 0.44 - 1.00 mg/dL   Calcium 8.6 (L) 8.9 - 10.3 mg/dL   Total Protein 5.8 (L) 6.5 - 8.1 g/dL   Albumin 3.2 (L) 3.5 - 5.0 g/dL   AST 17 15 - 41 U/L   ALT 15 14 - 54 U/L   Alkaline Phosphatase 42 38 - 126 U/L   Total Bilirubin 0.9 0.3 - 1.2 mg/dL   GFR calc non Af Amer >60 >60 mL/min   GFR calc Af Amer >60 >60 mL/min   Anion gap 6 5 - 15   Glucose, capillary     Status: Abnormal   Collection Time: 07/08/16  7:51 AM  Result Value Ref Range   Glucose-Capillary 57 (L) 65 - 99 mg/dL   Comment 1 Notify RN   Glucose, capillary     Status: Abnormal   Collection Time: 07/08/16  8:27 AM  Result Value Ref Range   Glucose-Capillary 62 (L) 65 - 99 mg/dL  Glucose, capillary     Status: Abnormal   Collection Time: 07/08/16  9:13 AM  Result Value Ref Range   Glucose-Capillary 148 (H) 65 - 99 mg/dL  Glucose, capillary     Status: Abnormal   Collection Time: 07/08/16 11:41 AM  Result Value Ref  Range   Glucose-Capillary 128 (H) 65 - 99 mg/dL   Comment 1 Notify RN     SUBJECTIVE:  Patient is had dizziness is more disequilibrium and nausea almost feeling like she can pass out but not room spinning. Her CT scan is shown some problems with the sphenoid sinus and return to place a scope into her nose.  OBJECTIVE:  A flexible nasal endoscopy was done and this is dictated in detail elsewhere. This showed the right nostril to be fairly open with a septal spur to the right side posteriorly. THe middle meatus was clear with no sign of infection. The posterior and of the inferior turbinate looks watery like a polyp. No sign of infection. The nasopharynx is clear. The scope was placed in the left side and the inferior turbinate is fine in the inferior nasopharynx is open, but there is a small polyp off the back of the middle turbinate and cannot find a middle meatus or see around the upper portion of the nose.  I reviewed her CT scans and MRI scans from 2018, 2015 and 2012. She has had some mucous membrane thickening in a very small distorted left sphenoid sinus going back into each year. This is been unchanged throughout this time. There is abnormal bony development. This looks to be a partial obstruction of the choanae no posteriorly on the left side which likely is the developmental anatomy challenge. No sign of acute infection or any fungus ball that  might cause headache, nausea, or dizziness.  IMPRESSION:  Patient has some polypoid changes in her nose and some anatomic abnormalities posteriorly on the left side that are not well delineated yet. This is not an acute problem and is not a source of her current symptoms. She does not have any acute or even chronic nasal symptoms that are significant  PLAN:  We are going to start mometasone nasal spray daily over the next month or so to try to settle down the mucosal swelling and polypoid changes. We'll plan to see her back in the office at that time to reevaluate with a nasal scope and plan to obtain a coronal CT of the sinuses at that time.  Valeen Borys H 07/08/2016, 6:59 PM

## 2016-07-08 NOTE — Consult Note (Signed)
Sandra Horton, Sandra Horton [id number] July 09, 1950 Sandra Basta, MD  Reason for Consult: Evaluate for dizziness  HPI: The patient is a 66 year old white female who has had vertigo in the past, several years ago, that lasted for about 1 week. She has not had any symptoms since that time with no change in her hearing. The patient presented yesterday with acute lightheadedness and nausea and visual disturbances upon sitting up in the morning. She feels fine when she lies flat but if she gets up and move around she feels nauseous like she might pass out. She does not have any vertigo and says this is definitely different than what she experienced previously. She denied any change in hearing. She doesn't complain of any nasal congestion or unusual drainage. She doesn't have seasonal allergies. She has bronchitis so couple weeks ago and was treated with some prednisone and that has subsided well.  Allergies:  Allergies  Allergen Reactions  . Penicillin V Potassium Nausea And Vomiting  . Penicillins   . Tetanus Toxoid Nausea And Vomiting    ROS: Review of systems normal other than 12 systems except per HPI.  PMH:  Past Medical History:  Diagnosis Date  . Allergy   . Anxiety   . Depression   . GERD (gastroesophageal reflux disease)   . High cholesterol   . Hyperlipidemia   . Osteoporosis   . Rosacea   . Thyroid disease   . Vertigo     FH:  Family History  Problem Relation Age of Onset  . Cancer Mother   . Diabetes Mother   . Thyroid disease Mother   . Hypertension Father   . Stroke Father   . Thyroid disease Sister   . Ovarian cancer Sister   . Diabetes Daughter     SH:  Social History   Social History  . Marital status: Married    Spouse name: N/A  . Number of children: N/A  . Years of education: N/A   Occupational History  . Not on file.   Social History Main Topics  . Smoking status: Never Smoker  . Smokeless tobacco: Never Used  . Alcohol use No  . Drug use: No  .  Sexual activity: Yes   Other Topics Concern  . Not on file   Social History Narrative  . No narrative on file    PSH:  Past Surgical History:  Procedure Laterality Date  . COLONOSCOPY    . DILATION AND CURETTAGE OF UTERUS  30 years ago    Physical  Exam: Well-developed well-nourished no acute distress. CN 2-12 grossly intact and symmetric. Feels fine lying down but are still nauseous when she sits up in bed. EAC/TMs normal BL. Oral cavity, lips, gums, ororpharynx normal with no masses or lesions. Skin warm and dry. Nasal cavity without any signs of infection or swelling.Marland Kitchen External nose and ears without masses or lesions. Neck supple with no masses or lesions. No lymphadenopathy palpated.   CT scan of her head was evaluated which shows some mucous membrane thickening involving the left sphenoid sinus but not the right. Most the rest of her sinuses appear to be clear. There seems to be some swelling in the back of the turbinates and possibly some polyps here. There is mild septal deviation with the rest of the area of the sinuses looks clear.   A/P: The patient has disequilibrium and nausea which feels more like passing out. She does not have any vertigo. She is seen by physical therapy and a  Dix-Hallpike test is being done to make sure she does not have any BPPV that can be treated. This does not appear to be an inner ear problem at all because of the lack of vertigo and instead a disequilibrium that tends to make her feel like she is going to pass out. She has an opacified sphenoid sinus and that can give you disequilibrium symptoms, but this appears to be more residual from her recent bronchitis and may be the source of the drainage and cough that she had. I will return this evening after office hours with a flexible scope to look in the back of her nose to see if there is any signs of polyps or purulent disease that would require treatment. I would stop antihistamines for now to make sure  there is appropriate drainage from that sphenoid sinus.   Sandra Horton H 07/08/2016 9:53 AM

## 2016-07-08 NOTE — Progress Notes (Signed)
[facility] NAME: Sandra Horton    MR#:  [id number]  DATE OF BIRTH:  July 25, 1950  SUBJECTIVE:  CHIEF COMPLAINT:   Chief Complaint  Patient presents with  . Near Syncope   [name] with episodes of near syncope and recurrent nausea for 1 day. Symptoms were very severe with even moving her head from side-to-side while lying down in the bed. Started on oral Valium, CT head was negative for any acute abnormality. Today feels slightly better but still had to similar episodes with less severe symptoms. REVIEW OF SYSTEMS:  CONSTITUTIONAL: No fever, fatigue or weakness.  EYES: No blurred or double vision.  EARS, NOSE, AND THROAT: No tinnitus or ear pain.  RESPIRATORY: No cough, shortness of breath, wheezing or hemoptysis.  CARDIOVASCULAR: No chest pain, orthopnea, edema.  GASTROINTESTINAL: No nausea, vomiting, diarrhea or abdominal pain.  GENITOURINARY: No dysuria, hematuria.  ENDOCRINE: No polyuria, nocturia,  HEMATOLOGY: No anemia, easy bruising or bleeding SKIN: No rash or lesion. MUSCULOSKELETAL: No joint pain or arthritis.   NEUROLOGIC: No tingling, numbness, weakness.  PSYCHIATRY: No anxiety or depression.   ROS  DRUG ALLERGIES:   Allergies  Allergen Reactions  . Penicillin V Potassium Nausea And Vomiting  . Penicillins   . Tetanus Toxoid Nausea And Vomiting    VITALS:  Blood pressure (!) 112/56, pulse 70, temperature 98.1 F (36.7 C), temperature source Oral, resp. rate 20, height 5\' 4"  (1.626 m), weight 61.1 kg (134 lb 9.6 oz), SpO2 99 %.  PHYSICAL EXAMINATION:  GENERAL:  66 y.o.-year-old patient lying in the bed with no acute distress.  EYES: Pupils equal, round, reactive to light and accommodation. No scleral icterus. Extraocular muscles intact.  HEENT: Head atraumatic, normocephalic. Oropharynx and nasopharynx clear.  NECK:  Supple, no jugular venous distention. No thyroid enlargement, no tenderness.  LUNGS: Normal breath  sounds bilaterally, no wheezing, rales,rhonchi or crepitation. No use of accessory muscles of respiration.  CARDIOVASCULAR: S1, S2 normal. No murmurs, rubs, or gallops.  ABDOMEN: Soft, nontender, nondistended. Bowel sounds present. No organomegaly or mass.  EXTREMITIES: No pedal edema, cyanosis, or clubbing.  NEUROLOGIC: Cranial nerves II through XII are intact. Muscle strength 5/5 in all extremities. Sensation intact. Gait not checked. Coordination checked in both upper limb and lower limbs and it is satisfactory. PSYCHIATRIC: The patient is alert and oriented x 3.  SKIN: No obvious rash, lesion, or ulcer.   Physical Exam LABORATORY PANEL:   CBC  Recent Labs Lab 07/08/16 0525  WBC 3.4*  HGB 11.6*  HCT 34.7*  PLT 150   ------------------------------------------------------------------------------------------------------------------  Chemistries   Recent Labs Lab 07/08/16 0525  NA 141  K 3.6  CL 113*  CO2 22  GLUCOSE 66  BUN 13  CREATININE 0.66  CALCIUM 8.6*  AST 17  ALT 15  ALKPHOS 42  BILITOT 0.9   ------------------------------------------------------------------------------------------------------------------  Cardiac Enzymes  Recent Labs Lab 07/07/16 0859  TROPONINI <0.03   ------------------------------------------------------------------------------------------------------------------  RADIOLOGY:  Ct Head Wo Contrast  Result Date: 07/07/2016 CLINICAL DATA:  66 year old female with vertigo. Nausea and dizziness. Near syncope. Symptom onset this morning. Initial encounter. EXAM: CT HEAD WITHOUT CONTRAST TECHNIQUE: Contiguous axial images were obtained from the base of the skull through the vertex without intravenous contrast. COMPARISON:  Brain MRI 03/24/2014.  Head CT 03/04/2014. FINDINGS: Brain: Cerebral volume remains normal for age. No midline shift, ventriculomegaly, mass effect, evidence of mass lesion, intracranial hemorrhage or evidence of cortically  based acute infarction. Gray-white  matter differentiation is within normal limits throughout the brain. Vascular: Mild Calcified atherosclerosis at the skull base. Skull: Stable.  No acute osseous abnormality identified. Sinuses/Orbits: Chronic posterior right nasal cavity polyp suspected, measuring about 17 mm where it protrudes into the anterior nasopharynx (series 3, image 1). This appears not significantly changed since 2015. Similar partially calcified polypoid lesion in the posterosuperior left nasal cavity, and associated Chronic left sphenoid sinusitis. Other paranasal sinuses are stable in clear. Bilateral tympanic cavities and mastoids appear clear. Other: No acute orbit or scalp soft tissue findings. IMPRESSION: 1. Stable and normal noncontrast CT appearance of the brain. 2. 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 Recommend ENT referral. Electronically Signed   By: Genevie Ann M.D.   On: 07/07/2016 13:48   Mr Brain Wo Contrast  Result Date: 07/08/2016 CLINICAL DATA:  66 year old female with vertigo and near syncope since 07/07/2016. Initial encounter. EXAM: MRI HEAD WITHOUT CONTRAST TECHNIQUE: Multiplanar, multiecho pulse sequences of the brain and surrounding structures were obtained without intravenous contrast. COMPARISON:  Head CT without contrast 07/07/2016. Brain MRI 03/24/2014. FINDINGS: Brain: Cerebral volume is stable since 2015 and within normal limits. No restricted diffusion to suggest acute infarction. No midline shift, mass effect, evidence of mass lesion, ventriculomegaly, extra-axial collection or acute intracranial hemorrhage. Cervicomedullary junction and pituitary are within normal limits. Mild for age nonspecific mostly anterior frontal lobe white matter T2 and FLAIR hyperintensity is stable since 2015. No cortical encephalomalacia  or chronic cerebral blood products. No new signal abnormality with negative deep gray matter nuclei, brainstem, and cerebellum. Vascular: Major intracranial vascular flow voids are stable since 2015 with a degree of generalized tortuosity. Skull and upper cervical spine: Negative. Visualized bone marrow signal is within normal limits. Sinuses/Orbits: Stable and negative orbits soft tissues. Chronic T2 hyperintense polyp or mucosal hypertrophy in the posterior right nasal cavity (series 7, image 4), and partially calcified round polypoid area in the posterosuperior left nasal cavity are stable since 2015, but the latter may be the etiology of chronic left sphenoid sinusitis. Trace paranasal sinus mucosal thickening elsewhere is stable. Other: Trace left mastoid fluid is unchanged since 2015. Negative nasopharynx. Visible internal auditory structures appear normal. Negative scalp soft tissues. IMPRESSION: 1.  No acute intracranial abnormality. Stable since 2015 and largely unremarkable for age noncontrast MRI appearance of the brain. There is chronic arterial tortuosity, and minimal nonspecific frontal lobe white matter changes. 2. Benign chronic posterior nasal cavity polyps or mucosal hypertrophy in association with chronic left sphenoid sinusitis. Consider ENT follow-up. Electronically Signed   By: Genevie Ann M.D.   On: 07/08/2016 14:25   US Carotid Bilateral  Result Date: 07/07/2016 CLINICAL DATA:  Vertigo since this morning. EXAM: BILATERAL CAROTID DUPLEX ULTRASOUND TECHNIQUE: Pearline Cables scale imaging, color Doppler and duplex ultrasound were performed of bilateral carotid and vertebral arteries in the neck. COMPARISON:  03/04/2014 carotid duplex study. FINDINGS: Criteria: Quantification of carotid stenosis is based on velocity parameters that correlate the residual internal carotid diameter with NASCET-based stenosis levels, using the diameter of the distal internal carotid lumen as the denominator for stenosis  measurement. The following velocity measurements were obtained: RIGHT ICA:  56 cm/sec CCA:  85 cm/sec SYSTOLIC ICA/CCA RATIO:  [id number] DIASTOLIC ICA/CCA RATIO:  [id number] ECA:  63 cm/sec LEFT ICA:  71 cm/sec CCA:  73 cm/sec SYSTOLIC ICA/CCA RATIO:  [id number] DIASTOLIC ICA/CCA RATIO:  0.77 ECA:  73 cm/sec RIGHT CAROTID ARTERY: Mild calcified plaque at the right carotid bulb and bifurcation, with no significant right internal carotid artery stenosis on grayscale imaging. No hemodynamically significant right internal carotid artery stenosis by Doppler criteria. RIGHT VERTEBRAL ARTERY:  Patent with normal antegrade flow. LEFT CAROTID ARTERY: No significant plaque demonstrated at the left carotid bulb and bifurcation on grayscale imaging. No hemodynamically significant left internal carotid artery stenosis by Doppler criteria. LEFT VERTEBRAL ARTERY:  Patent with normal antegrade flow. IMPRESSION: 1. Mild calcified right and no significant left carotid plaque, with no hemodynamically significant carotid stenosis (0- 49%). 2. Normal antegrade flow in both vertebral arteries. Electronically Signed   By: Ilona Sorrel M.D.   On: 07/07/2016 16:48    ASSESSMENT AND PLAN:   Principal Problem:   Near syncope Active Problems:   Hypothyroidism   Chronic neck and back pain   Vertigo  * Near syncope, vertigo   CT scan and MRI on head negative.   Patient feels slightly better after starting oral Valium.   Seen by ENT specialist, planning for scoping posterior off his nose to check for polyps or infections.   Appreciated physical therapy consult.  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  * Hypothyroidism   Continue levothyroxine.  * Hyperlipidemia   Continue pravastatin.  All the records are reviewed and case discussed with Care Management/Social Workerr. Management plans discussed with the patient, family and they are in agreement.  CODE STATUS: Full code.  TOTAL TIME TAKING CARE OF THIS PATIENT:  35 minutes.   Patient's husband, daughter, granddaughter were present in the room during my visit.  POSSIBLE D/C IN 1-2 DAYS, DEPENDING ON CLINICAL CONDITION.   Vaughan Basta M.D on 07/08/2016   Between 7am to 6pm - Pager - [phone]  After 6pm go to [email or web address] - password [email or web address] [facility]  Office  [phone]  CC: Primary care physician; Coral Spikes, DO  Note: This dictation was prepared with Dragon dictation along with smaller phrase technology. Any transcriptional errors that result from this process are unintentional.

## 2016-07-09 ENCOUNTER — Observation Stay: Payer: BLUE CROSS/BLUE SHIELD

## 2016-07-09 DIAGNOSIS — R42 Dizziness and giddiness: Secondary | ICD-10-CM | POA: Diagnosis not present

## 2016-07-09 DIAGNOSIS — R55 Syncope and collapse: Secondary | ICD-10-CM | POA: Diagnosis not present

## 2016-07-09 LAB — GLUCOSE, CAPILLARY: Glucose-Capillary: 117 mg/dL — ABNORMAL HIGH (ref 65–99)

## 2016-07-09 LAB — TSH: TSH: 0.235 u[IU]/mL — ABNORMAL LOW (ref 0.350–4.500)

## 2016-07-09 MED ORDER — FLUTICASONE PROPIONATE 50 MCG/ACT NA SUSP
2.0000 | Freq: Every day | NASAL | Status: DC
  Administered 2016-07-09 – 2016-07-11 (×3): 2 via NASAL
  Filled 2016-07-09: qty 16

## 2016-07-09 MED ORDER — MECLIZINE HCL 25 MG PO TABS
12.5000 mg | ORAL_TABLET | Freq: Three times a day (TID) | ORAL | Status: DC
  Administered 2016-07-09 (×2): 12.5 mg via ORAL
  Filled 2016-07-09 (×2): qty 1

## 2016-07-09 MED ORDER — FLUTICASONE PROPIONATE 50 MCG/ACT NA SUSP
2.0000 | Freq: Every day | NASAL | Status: DC
  Filled 2016-07-09: qty 16

## 2016-07-09 NOTE — Consult Note (Addendum)
Reason for Consult:Pre-syncope Referring Physician: Anselm Jungling  CC: Pre-syncope  HPI: Sandra Horton is an 66 y.o. female presenting with near syncope and positional vertigo.  Patient reports that on presentation she was unable to do anything but lie still or her symptoms were unbearable.  She has now improved to the point that she has to move slowly.  Was moved quickly this morning and although the vertigo has not returned she still feels as if she is about to pass out.  She had no significant improvement with the Epley maneuver.   Patient repots a viral illness prior to her onset of symptoms.    Past Medical History:  Diagnosis Date  . Allergy   . Anxiety   . Depression   . GERD (gastroesophageal reflux disease)   . High cholesterol   . Hyperlipidemia   . Osteoporosis   . Rosacea   . Thyroid disease   . Vertigo     Past Surgical History:  Procedure Laterality Date  . COLONOSCOPY    . DILATION AND CURETTAGE OF UTERUS  30 years ago    Family History  Problem Relation Age of Onset  . Cancer Mother   . Diabetes Mother   . Thyroid disease Mother   . Hypertension Father   . Stroke Father   . Thyroid disease Sister   . Ovarian cancer Sister   . Diabetes Daughter     Social History:  reports that she has never smoked. She has never used smokeless tobacco. She reports that she does not drink alcohol or use drugs.  Allergies  Allergen Reactions  . Penicillin V Potassium Nausea And Vomiting  . Penicillins   . Tetanus Toxoid Nausea And Vomiting    Medications:  I have reviewed the patient's current medications. Prior to Admission:  Prescriptions Prior to Admission  Medication Sig Dispense Refill Last Dose  . aspirin 81 MG tablet Take 81 mg by mouth daily.   07/06/2016 at pm  . BIOTIN PO Take 1 tablet by mouth daily.   07/06/2016 at am  . cetirizine (ZYRTEC) 10 MG tablet Take 10 mg by mouth daily.   07/06/2016 at am  . docusate sodium (COLACE) 100 MG capsule Take 100 mg by mouth  2 (two) times daily as needed for mild constipation.   07/06/2016 at Unknown time  . ESTRACE VAGINAL 0.1 MG/GM vaginal cream Place 1 application vaginally once a week.  0 07/01/2016  . levothyroxine (SYNTHROID, LEVOTHROID) 100 MCG tablet TAKE ONE TABLET BY MOUTH ONCE DAILY 90 tablet 2 07/06/2016 at am  . lovastatin (MEVACOR) 40 MG tablet Take 1 tablet (40 mg total) by mouth daily. 90 tablet 3 07/06/2016 at pm  . magnesium chloride (SLOW-MAG) 64 MG TBEC SR tablet Take 1 tablet by mouth.   07/06/2016 at am  . meloxicam (MOBIC) 15 MG tablet Take 1 tablet (15 mg total) by mouth daily. 90 tablet 1 07/06/2016 at am  . omeprazole (PRILOSEC) 20 MG capsule Take 1 capsule (20 mg total) by mouth 2 (two) times daily. 180 capsule 3 07/06/2016 at am  . PARoxetine (PAXIL) 20 MG tablet Take 1 tablet (20 mg total) by mouth daily. 90 tablet 3 07/06/2016 at pm  . polyethylene glycol (MIRALAX / GLYCOLAX) packet Take 17 g by mouth daily.     . valACYclovir (VALTREX) 500 MG tablet Take 500 mg by mouth daily.     . vitamin B-12 (CYANOCOBALAMIN) 500 MCG tablet Take 500 mcg by mouth daily.   07/06/2016  at am  . vitamin C (ASCORBIC ACID) 500 MG tablet Take 500 mg by mouth daily.   07/06/2016 at pm  . albuterol (PROVENTIL HFA;VENTOLIN HFA) 108 (90 Base) MCG/ACT inhaler Inhale 2 puffs into the lungs every 6 (six) hours as needed for wheezing or shortness of breath. 1 Inhaler 0   . chlorpheniramine-HYDROcodone (TUSSIONEX PENNKINETIC ER) 10-8 MG/5ML SUER Take 5 mLs by mouth every 12 (twelve) hours as needed. 115 mL 0   . predniSONE (DELTASONE) 50 MG tablet 1 tablet daily x 5 days. 5 tablet 0    Scheduled: . aspirin EC  81 mg Oral Daily  . diazepam  2 mg Oral Q8H  . docusate sodium  100 mg Oral BID  . heparin  5,000 Units Subcutaneous Q8H  . levothyroxine  100 mcg Oral QAC breakfast  . loratadine  10 mg Oral Daily  . magnesium chloride  1 tablet Oral Daily  . meclizine  12.5 mg Oral TID  . meloxicam  15 mg Oral Daily  . pantoprazole  40 mg  Oral Daily  . PARoxetine  20 mg Oral QPM  . polyethylene glycol  17 g Oral Daily  . pravastatin  40 mg Oral q1800  . sodium chloride flush  3 mL Intravenous Q12H  . valACYclovir  500 mg Oral Daily  . vitamin B-12  500 mcg Oral Daily  . vitamin C  500 mg Oral Daily    ROS: History obtained from the patient  General ROS: negative for - chills, fatigue, fever, night sweats, weight gain or weight loss Psychological ROS: negative for - behavioral disorder, hallucinations, memory difficulties, mood swings or suicidal ideation Ophthalmic ROS: negative for - blurry vision, double vision, eye pain or loss of vision ENT ROS: negative for - epistaxis, nasal discharge, oral lesions, sore throat, tinnitus or vertigo Allergy and Immunology ROS: negative for - hives or itchy/watery eyes Hematological and Lymphatic ROS: negative for - bleeding problems, bruising or swollen lymph nodes Endocrine ROS: negative for - galactorrhea, hair pattern changes, polydipsia/polyuria or temperature intolerance Respiratory ROS: negative for - cough, hemoptysis, shortness of breath or wheezing Cardiovascular ROS: negative for - chest pain, dyspnea on exertion, edema or irregular heartbeat Gastrointestinal ROS: negative for - abdominal pain, diarrhea, hematemesis, nausea/vomiting or stool incontinence Genito-Urinary ROS: negative for - dysuria, hematuria, incontinence or urinary frequency/urgency Musculoskeletal ROS: negative for - joint swelling or muscular weakness Neurological ROS: as noted in HPI Dermatological ROS: negative for rash and skin lesion changes  Physical Examination: Blood pressure 91/65, pulse 75, temperature 97.9 F (36.6 C), temperature source Oral, resp. rate 17, height 5\' 4"  (1.626 m), weight 62.6 kg (138 lb), SpO2 100 %.  HEENT-  Normocephalic, no lesions, without obvious abnormality.  Normal external eye and conjunctiva.  Normal TM's bilaterally.  Normal auditory canals and external ears.  Normal external nose, mucus membranes and septum.  Normal pharynx. Cardiovascular- S1, S2 normal, pulses palpable throughout   Lungs- chest clear, no wheezing, rales, normal symmetric air entry Abdomen- soft, non-tender; bowel sounds normal; no masses,  no organomegaly Extremities- no edema Lymph-no adenopathy palpable Musculoskeletal-no joint tenderness, deformity or swelling Skin-warm and dry, no hyperpigmentation, vitiligo, or suspicious lesions  Neurological Examination   Mental Status: Alert, oriented, thought content appropriate.  Speech fluent without evidence of aphasia.  Able to follow 3 step commands without difficulty. Cranial Nerves: II: Discs flat bilaterally; Visual fields grossly normal, pupils equal, round, reactive to light and accommodation III,IV, VI: ptosis not present, extra-ocular motions  intact bilaterally V,VII: smile symmetric, facial light touch sensation normal bilaterally VIII: hearing normal bilaterally IX,X: gag reflex present XI: bilateral shoulder shrug XII: midline tongue extension Motor: Right : Upper extremity   5/5    Left:     Upper extremity   5/5  Lower extremity   5/5     Lower extremity   5/5 Tone and bulk:normal tone throughout; no atrophy noted Sensory: Pinprick and light touch intact throughout, bilaterally Deep Tendon Reflexes: 2+ and symmetric throughout Plantars: Right: mute   Left: mute Cerebellar: normal finger-to-nose and normal heel-to-shin testing bilaterally Gait: not tested due to safety concerns    Laboratory Studies:   Basic Metabolic Panel:  Recent Labs Lab 07/07/16 0859 07/08/16 0525  NA 142 141  K 4.3 3.6  CL 110 113*  CO2 26 22  GLUCOSE 110* 66  BUN 15 13  CREATININE 0.78 0.66  CALCIUM 9.0 8.6*    Liver Function Tests:  Recent Labs Lab 07/07/16 0859 07/08/16 0525  AST 19 17  ALT 17 15  ALKPHOS 43 42  BILITOT 0.7 0.9  PROT 6.5 5.8*  ALBUMIN 3.7 3.2*   No results for input(s): LIPASE, AMYLASE in  the last 168 hours. No results for input(s): AMMONIA in the last 168 hours.  CBC:  Recent Labs Lab 07/07/16 0859 07/08/16 0525  WBC 3.5* 3.4*  NEUTROABS 2.1  --   HGB 12.3 11.6*  HCT 35.4 34.7*  MCV 94.1 95.9  PLT 165 150    Cardiac Enzymes:  Recent Labs Lab 07/07/16 0859  TROPONINI <0.03    BNP: Invalid input(s): POCBNP  CBG:  Recent Labs Lab 07/08/16 0751 07/08/16 0827 07/08/16 0913 07/08/16 1141 07/09/16 0825  GLUCAP 57* 62* 148* 128* 117*    Microbiology: Results for orders placed or performed in visit on 06/14/13  Influenza A&B Antigens [facility])     Status: None   Collection Time: 06/14/13 12:12 PM  Result Value Ref Range Status   Micro Text Report   Final       SOURCE: nasal    COMMENT                   NEGATIVE FOR INFLUENZA A (ANTIGEN ABSENT)   COMMENT                   NEGATIVE FOR INFLUENZA B (ANTIGEN ABSENT)   ANTIBIOTIC                                                        Coagulation Studies: No results for input(s): LABPROT, INR in the last 72 hours.  Urinalysis:  Recent Labs Lab 07/07/16 1735  COLORURINE YELLOW*  LABSPEC 1.008  PHURINE 6.0  GLUCOSEU NEGATIVE  HGBUR NEGATIVE  BILIRUBINUR NEGATIVE  KETONESUR 5*  PROTEINUR NEGATIVE  NITRITE NEGATIVE  LEUKOCYTESUR NEGATIVE    Lipid Panel:     Component Value Date/Time   CHOL 145 02/22/2016 0934   CHOL 184 05/11/2015 0826   TRIG 76.0 02/22/2016 0934   HDL 58.90 02/22/2016 0934   HDL 84 05/11/2015 0826   CHOLHDL 2 02/22/2016 0934   VLDL 15.2 02/22/2016 0934   LDLCALC 71 02/22/2016 0934   LDLCALC 90 05/11/2015 0826    HgbA1C: No results found for: HGBA1C  Urine Drug Screen:  No results  found for: LABOPIA, COCAINSCRNUR, LABBENZ, AMPHETMU, THCU, LABBARB  Alcohol Level: No results for input(s): ETH in the last 168 hours.   Imaging: Mr Brain Wo Contrast  Result Date: 07/08/2016 CLINICAL DATA:  66 year old female with vertigo and near syncope since 07/07/2016. Initial  encounter. EXAM: MRI HEAD WITHOUT CONTRAST TECHNIQUE: Multiplanar, multiecho pulse sequences of the brain and surrounding structures were obtained without intravenous contrast. COMPARISON:  Head CT without contrast 07/07/2016. Brain MRI 03/24/2014. FINDINGS: Brain: Cerebral volume is stable since 2015 and within normal limits. No restricted diffusion to suggest acute infarction. No midline shift, mass effect, evidence of mass lesion, ventriculomegaly, extra-axial collection or acute intracranial hemorrhage. Cervicomedullary junction and pituitary are within normal limits. Mild for age nonspecific mostly anterior frontal lobe white matter T2 and FLAIR hyperintensity is stable since 2015. No cortical encephalomalacia or chronic cerebral blood products. No new signal abnormality with negative deep gray matter nuclei, brainstem, and cerebellum. Vascular: Major intracranial vascular flow voids are stable since 2015 with a degree of generalized tortuosity. Skull and upper cervical spine: Negative. Visualized bone marrow signal is within normal limits. Sinuses/Orbits: Stable and negative orbits soft tissues. Chronic T2 hyperintense polyp or mucosal hypertrophy in the posterior right nasal cavity (series 7, image 4), and partially calcified round polypoid area in the posterosuperior left nasal cavity are stable since 2015, but the latter may be the etiology of chronic left sphenoid sinusitis. Trace paranasal sinus mucosal thickening elsewhere is stable. Other: Trace left mastoid fluid is unchanged since 2015. Negative nasopharynx. Visible internal auditory structures appear normal. Negative scalp soft tissues. IMPRESSION: 1.  No acute intracranial abnormality. Stable since 2015 and largely unremarkable for age noncontrast MRI appearance of the brain. There is chronic arterial tortuosity, and minimal nonspecific frontal lobe white matter changes. 2. 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 Consider ENT follow-up. Electronically Signed   By: Genevie Ann M.D.   On: 07/08/2016 14:25   US Carotid Bilateral  Result Date: 07/07/2016 CLINICAL DATA:  Vertigo since this morning. EXAM: BILATERAL CAROTID DUPLEX ULTRASOUND TECHNIQUE: Pearline Cables scale imaging, color Doppler and duplex ultrasound were performed of bilateral carotid and vertebral arteries in the neck. COMPARISON:  03/04/2014 carotid duplex study. FINDINGS: Criteria: Quantification of carotid stenosis is based on velocity parameters that correlate the residual internal carotid diameter with NASCET-based stenosis levels, using the diameter of the distal internal carotid lumen as the denominator for stenosis measurement. The following velocity measurements were obtained: RIGHT ICA:  56 cm/sec CCA:  85 cm/sec SYSTOLIC ICA/CCA RATIO:  [id number] DIASTOLIC ICA/CCA RATIO:  [id number] ECA:  63 cm/sec LEFT ICA:  71 cm/sec CCA:  73 cm/sec SYSTOLIC ICA/CCA RATIO:  [id number] DIASTOLIC ICA/CCA RATIO:  [id number] ECA:  73 cm/sec RIGHT CAROTID ARTERY: Mild calcified plaque at the right carotid bulb and bifurcation, with no significant right internal carotid artery stenosis on grayscale imaging. No hemodynamically significant right internal carotid artery stenosis by Doppler criteria. RIGHT VERTEBRAL ARTERY:  Patent with normal antegrade flow. LEFT CAROTID ARTERY: No significant plaque demonstrated at the left carotid bulb and bifurcation on grayscale imaging. No hemodynamically significant left internal carotid artery stenosis by Doppler criteria. LEFT VERTEBRAL ARTERY:  Patent with normal antegrade flow. IMPRESSION: 1. Mild calcified right and no significant left carotid plaque, with no hemodynamically significant carotid stenosis (0- 49%). 2. Normal antegrade flow in both vertebral arteries. Electronically Signed   By: Ilona Sorrel M.D.   On: 07/07/2016 16:48  Assessment/Plan: 66 year old female presenting with vertigo and presyncope.   Had a previous viral illness.  Has improved to some degree but not back to baseline.  MRI of the brain performed and shows no acute changes.  Carotid dopplers unremarkable.  Patient not orthostatic.  Suspect a viral labyrinthitis.  Has not gotten significant improvement from Meclizine and has been concerned about using Valium because it made it difficult for her to sleep.  Recommendations: 1.  Valium prn during the day 2.  EEG 3.  Agree with low dose ASA 4.  No further neurologic intervention is recommended at this time.  If further questions arise, please call or page at that time.  Thank you for allowing neurology to participate in the care of this patient.   Alexis Goodell, MD Neurology [phone] 07/09/2016, 2:02 PM

## 2016-07-09 NOTE — Progress Notes (Signed)
PT Cancellation Note  Patient Details Name: Sandra Horton MRN: [id number] DOB: May 18, 1951   Cancelled Treatment:    Reason Eval/Treat Not Completed: Patient at procedure or test/unavailable Pt unavailable for PT; out of room for testing.  Will try back later as time allows.  Kreg Shropshire, DPT 07/09/2016, 3:35 PM

## 2016-07-09 NOTE — Progress Notes (Signed)
Pt was assisted to the restroom where she felt like she was going to black out. Put head down while sitting on toilet. cna called this rn and vitals were taken 114/62. Felt better after a few minutes and assisted back to bed where she felt dizzy and felt like she was going to black out. Pt never lost consciousness. Put into bed and feels a little better but the same.

## 2016-07-09 NOTE — Progress Notes (Signed)
Called to room; NT states that patient was getting up to [facility] and felt like she was going to "pass out"; laid back down on bed; VSS; no loss of consciousness; "It feels like a wave of blackness that comes over me"; assisted to [facility] to void and back to bed; husband at bedside. Bed alarm set. Barbaraann Faster, RN 10:57 PM 07/09/2016

## 2016-07-09 NOTE — Progress Notes (Signed)
[place] [facility] [place] NAME: Sandra Horton    MR#:  [id number]  DATE OF BIRTH:  1951/05/06  SUBJECTIVE:  CHIEF COMPLAINT:   Chief Complaint  Patient presents with  . Near Syncope   [name] with episodes of near syncope and recurrent nausea for 1 day. Symptoms were very severe with even moving her head from side-to-side while lying down in the bed. Started on oral Valium, CT head was negative for any acute abnormality.  Still feels dizzi and vertigo with trying to move in bed, not able to get out of bed. Also had some headache. REVIEW OF SYSTEMS:  CONSTITUTIONAL: No fever, fatigue or weakness.  EYES: No blurred or double vision.  EARS, NOSE, AND THROAT: No tinnitus or ear pain.  RESPIRATORY: No cough, shortness of breath, wheezing or hemoptysis.  CARDIOVASCULAR: No chest pain, orthopnea, edema.  GASTROINTESTINAL: No nausea, vomiting, diarrhea or abdominal pain.  GENITOURINARY: No dysuria, hematuria.  ENDOCRINE: No polyuria, nocturia,  HEMATOLOGY: No anemia, easy bruising or bleeding SKIN: No rash or lesion. MUSCULOSKELETAL: No joint pain or arthritis.   NEUROLOGIC: No tingling, numbness, weakness.  PSYCHIATRY: No anxiety or depression.   ROS  DRUG ALLERGIES:   Allergies  Allergen Reactions  . Penicillin V Potassium Nausea And Vomiting  . Penicillins   . Tetanus Toxoid Nausea And Vomiting    VITALS:  Blood pressure 91/65, pulse 75, temperature 97.9 F (36.6 C), temperature source Oral, resp. rate 17, height 5\' 4"  (1.626 m), weight 62.6 kg (138 lb), SpO2 100 %.  PHYSICAL EXAMINATION:  GENERAL:  66 y.o.-year-old patient lying in the bed with no acute distress.  EYES: Pupils equal, round, reactive to light and accommodation. No scleral icterus. Extraocular muscles intact.  HEENT: Head atraumatic, normocephalic. Oropharynx and nasopharynx clear.  NECK:  Supple, no jugular venous distention. No thyroid enlargement, no tenderness.  LUNGS:  Normal breath sounds bilaterally, no wheezing, rales,rhonchi or crepitation. No use of accessory muscles of respiration.  CARDIOVASCULAR: S1, S2 normal. No murmurs, rubs, or gallops.  ABDOMEN: Soft, nontender, nondistended. Bowel sounds present. No organomegaly or mass.  EXTREMITIES: No pedal edema, cyanosis, or clubbing.  NEUROLOGIC: Cranial nerves II through XII are intact. Muscle strength 5/5 in all extremities. Sensation intact. Gait not checked. Coordination checked in both upper limb and lower limbs and it is satisfactory. PSYCHIATRIC: The patient is alert and oriented x 3.  SKIN: No obvious rash, lesion, or ulcer.   Physical Exam LABORATORY PANEL:   CBC  Recent Labs Lab 07/08/16 0525  WBC 3.4*  HGB 11.6*  HCT 34.7*  PLT 150   ------------------------------------------------------------------------------------------------------------------  Chemistries   Recent Labs Lab 07/08/16 0525  NA 141  K 3.6  CL 113*  CO2 22  GLUCOSE 66  BUN 13  CREATININE 0.66  CALCIUM 8.6*  AST 17  ALT 15  ALKPHOS 42  BILITOT 0.9   ------------------------------------------------------------------------------------------------------------------  Cardiac Enzymes  Recent Labs Lab 07/07/16 0859  TROPONINI <0.03   ------------------------------------------------------------------------------------------------------------------  RADIOLOGY:  Ct Head Wo Contrast  Result Date: 07/07/2016 CLINICAL DATA:  66 year old female with vertigo. Nausea and dizziness. Near syncope. Symptom onset this morning. Initial encounter. EXAM: CT HEAD WITHOUT CONTRAST TECHNIQUE: Contiguous axial images were obtained from the base of the skull through the vertex without intravenous contrast. COMPARISON:  Brain MRI 03/24/2014.  Head CT 03/04/2014. FINDINGS: Brain: Cerebral volume remains normal for age. No midline shift, ventriculomegaly, mass effect, evidence of mass lesion, intracranial hemorrhage or evidence  of cortically based acute infarction. Gray-white matter differentiation is within normal limits throughout the brain. Vascular: Mild Calcified atherosclerosis at the skull base. Skull: Stable.  No acute osseous abnormality identified. Sinuses/Orbits: Chronic posterior right nasal cavity polyp suspected, measuring about 17 mm where it protrudes into the anterior nasopharynx (series 3, image 1). This appears not significantly changed since 2015. Similar partially calcified polypoid lesion in the posterosuperior left nasal cavity, and associated Chronic left sphenoid sinusitis. Other paranasal sinuses are stable in clear. Bilateral tympanic cavities and mastoids appear clear. Other: No acute orbit or scalp soft tissue findings. IMPRESSION: 1. Stable and normal noncontrast CT appearance of the brain. 2. 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 Recommend ENT referral. Electronically Signed   By: Genevie Ann M.D.   On: 07/07/2016 13:48   Mr Brain Wo Contrast  Result Date: 07/08/2016 CLINICAL DATA:  66 year old female with vertigo and near syncope since 07/07/2016. Initial encounter. EXAM: MRI HEAD WITHOUT CONTRAST TECHNIQUE: Multiplanar, multiecho pulse sequences of the brain and surrounding structures were obtained without intravenous contrast. COMPARISON:  Head CT without contrast 07/07/2016. Brain MRI 03/24/2014. FINDINGS: Brain: Cerebral volume is stable since 2015 and within normal limits. No restricted diffusion to suggest acute infarction. No midline shift, mass effect, evidence of mass lesion, ventriculomegaly, extra-axial collection or acute intracranial hemorrhage. Cervicomedullary junction and pituitary are within normal limits. Mild for age nonspecific mostly anterior frontal lobe white matter T2 and FLAIR hyperintensity is stable since 2015. No cortical  encephalomalacia or chronic cerebral blood products. No new signal abnormality with negative deep gray matter nuclei, brainstem, and cerebellum. Vascular: Major intracranial vascular flow voids are stable since 2015 with a degree of generalized tortuosity. Skull and upper cervical spine: Negative. Visualized bone marrow signal is within normal limits. Sinuses/Orbits: Stable and negative orbits soft tissues. Chronic T2 hyperintense polyp or mucosal hypertrophy in the posterior right nasal cavity (series 7, image 4), and partially calcified round polypoid area in the posterosuperior left nasal cavity are stable since 2015, but the latter may be the etiology of chronic left sphenoid sinusitis. Trace paranasal sinus mucosal thickening elsewhere is stable. Other: Trace left mastoid fluid is unchanged since 2015. Negative nasopharynx. Visible internal auditory structures appear normal. Negative scalp soft tissues. IMPRESSION: 1.  No acute intracranial abnormality. Stable since 2015 and largely unremarkable for age noncontrast MRI appearance of the brain. There is chronic arterial tortuosity, and minimal nonspecific frontal lobe white matter changes. 2. Benign chronic posterior nasal cavity polyps or mucosal hypertrophy in association with chronic left sphenoid sinusitis. Consider ENT follow-up. Electronically Signed   By: Genevie Ann M.D.   On: 07/08/2016 14:25   US Carotid Bilateral  Result Date: 07/07/2016 CLINICAL DATA:  Vertigo since this morning. EXAM: BILATERAL CAROTID DUPLEX ULTRASOUND TECHNIQUE: Pearline Cables scale imaging, color Doppler and duplex ultrasound were performed of bilateral carotid and vertebral arteries in the neck. COMPARISON:  03/04/2014 carotid duplex study. FINDINGS: Criteria: Quantification of carotid stenosis is based on velocity parameters that correlate the residual internal carotid diameter with NASCET-based stenosis levels, using the diameter of the distal internal carotid lumen as the denominator  for stenosis measurement. The following velocity measurements were obtained: RIGHT ICA:  56 cm/sec CCA:  85 cm/sec SYSTOLIC ICA/CCA RATIO:  [id number] DIASTOLIC ICA/CCA RATIO:  [id number] ECA:  63 cm/sec LEFT ICA:  71 cm/sec CCA:  73 cm/sec SYSTOLIC ICA/CCA  RATIO:  [id number] DIASTOLIC ICA/CCA RATIO:  [id number] ECA:  73 cm/sec RIGHT CAROTID ARTERY: Mild calcified plaque at the right carotid bulb and bifurcation, with no significant right internal carotid artery stenosis on grayscale imaging. No hemodynamically significant right internal carotid artery stenosis by Doppler criteria. RIGHT VERTEBRAL ARTERY:  Patent with normal antegrade flow. LEFT CAROTID ARTERY: No significant plaque demonstrated at the left carotid bulb and bifurcation on grayscale imaging. No hemodynamically significant left internal carotid artery stenosis by Doppler criteria. LEFT VERTEBRAL ARTERY:  Patent with normal antegrade flow. IMPRESSION: 1. Mild calcified right and no significant left carotid plaque, with no hemodynamically significant carotid stenosis (0- 49%). 2. Normal antegrade flow in both vertebral arteries. Electronically Signed   By: Ilona Sorrel M.D.   On: 07/07/2016 16:48    ASSESSMENT AND PLAN:   Principal Problem:   Near syncope Active Problems:   Hypothyroidism   Chronic neck and back pain   Vertigo  * Near syncope, vertigo   CT scan and MRI on head negative. Carotid doppler with no significant abnormalities.   Patient feels slightly better after starting oral Valium.   Seen by ENT specialist, also had a flexible scoping of her nose and sinuses, seen some chronic changes, but nothing to explain her symptoms.   Appreciated physical therapy consult.   Start oral meclizine.   Neurology consult.    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  * Hypothyroidism   Continue levothyroxine.   Check TSH.  * Hyperlipidemia   Continue pravastatin.  All the records are reviewed and case discussed with Care  Management/Social Workerr. Management plans discussed with the patient, family and they are in agreement.  CODE STATUS: Full code.  TOTAL TIME TAKING CARE OF THIS PATIENT: 35 minutes.   Patient's husband,was present in the room during my visit.  POSSIBLE D/C IN 1-2 DAYS, DEPENDING ON CLINICAL CONDITION.   Vaughan Basta M.D on 07/09/2016   Between 7am to 6pm - Pager - [phone]  After 6pm go to [email or web address] - password [email or web address] [facility]  Office  [phone]  CC: Primary care physician; Coral Spikes, DO  Note: This dictation was prepared with Dragon dictation along with smaller phrase technology. Any transcriptional errors that result from this process are unintentional.

## 2016-07-10 DIAGNOSIS — R55 Syncope and collapse: Secondary | ICD-10-CM | POA: Diagnosis not present

## 2016-07-10 LAB — GLUCOSE, CAPILLARY: GLUCOSE-CAPILLARY: 71 mg/dL (ref 65–99)

## 2016-07-10 MED ORDER — DIAZEPAM 5 MG PO TABS
5.0000 mg | ORAL_TABLET | Freq: Three times a day (TID) | ORAL | Status: DC
  Administered 2016-07-10 – 2016-07-11 (×6): 5 mg via ORAL
  Filled 2016-07-10 (×7): qty 1

## 2016-07-10 NOTE — Progress Notes (Signed)
[place] [facility] [place] NAME: Sandra Horton    MR#:  [id number]  DATE OF BIRTH:  1951/03/09  SUBJECTIVE:  CHIEF COMPLAINT:   Chief Complaint  Patient presents with  . Near Syncope   [name] with episodes of near syncope and recurrent nausea for 1 day. Symptoms were very severe with even moving her head from side-to-side while lying down in the bed. Started on oral Valium, CT head was negative for any acute abnormality.  Still feels dizzi and vertigo with trying to move in bed, not able to get out of bed. Also had some headache.  she had total 8-9 episodes of dizziness last night and this morning. REVIEW OF SYSTEMS:  CONSTITUTIONAL: No fever, fatigue or weakness.  EYES: No blurred or double vision.  EARS, NOSE, AND THROAT: No tinnitus or ear pain.  RESPIRATORY: No cough, shortness of breath, wheezing or hemoptysis.  CARDIOVASCULAR: No chest pain, orthopnea, edema.  GASTROINTESTINAL: No nausea, vomiting, diarrhea or abdominal pain.  GENITOURINARY: No dysuria, hematuria.  ENDOCRINE: No polyuria, nocturia,  HEMATOLOGY: No anemia, easy bruising or bleeding SKIN: No rash or lesion. MUSCULOSKELETAL: No joint pain or arthritis.   NEUROLOGIC: No tingling, numbness, weakness.  PSYCHIATRY: No anxiety or depression.   ROS  DRUG ALLERGIES:   Allergies  Allergen Reactions  . Penicillin V Potassium Nausea And Vomiting  . Penicillins   . Tetanus Toxoid Nausea And Vomiting    VITALS:  Blood pressure 112/72, pulse 64, temperature 97.9 F (36.6 C), temperature source Oral, resp. rate 16, height 5\' 4"  (1.626 m), weight 62.6 kg (138 lb), SpO2 100 %.  PHYSICAL EXAMINATION:  GENERAL:  66 y.o.-year-old patient lying in the bed with no acute distress.  EYES: Pupils equal, round, reactive to light and accommodation. No scleral icterus. Extraocular muscles intact.  HEENT: Head atraumatic, normocephalic. Oropharynx and nasopharynx clear.  NECK:  Supple, no  jugular venous distention. No thyroid enlargement, no tenderness.  LUNGS: Normal breath sounds bilaterally, no wheezing, rales,rhonchi or crepitation. No use of accessory muscles of respiration.  CARDIOVASCULAR: S1, S2 normal. No murmurs, rubs, or gallops.  ABDOMEN: Soft, nontender, nondistended. Bowel sounds present. No organomegaly or mass.  EXTREMITIES: No pedal edema, cyanosis, or clubbing.  NEUROLOGIC: Cranial nerves II through XII are intact. Muscle strength 5/5 in all extremities. Sensation intact. Gait not checked. Coordination checked in both upper limb and lower limbs and it is satisfactory. PSYCHIATRIC: The patient is alert and oriented x 3.  SKIN: No obvious rash, lesion, or ulcer.   Physical Exam LABORATORY PANEL:   CBC  Recent Labs Lab 07/08/16 0525  WBC 3.4*  HGB 11.6*  HCT 34.7*  PLT 150   ------------------------------------------------------------------------------------------------------------------  Chemistries   Recent Labs Lab 07/08/16 0525  NA 141  K 3.6  CL 113*  CO2 22  GLUCOSE 66  BUN 13  CREATININE 0.66  CALCIUM 8.6*  AST 17  ALT 15  ALKPHOS 42  BILITOT 0.9   ------------------------------------------------------------------------------------------------------------------  Cardiac Enzymes  Recent Labs Lab 07/07/16 0859  TROPONINI <0.03   ------------------------------------------------------------------------------------------------------------------  RADIOLOGY:  No results found.  ASSESSMENT AND PLAN:   Principal Problem:   Near syncope Active Problems:   Hypothyroidism   Chronic neck and back pain   Vertigo  * Near syncope, vertigo   CT scan and MRI on head negative. Carotid doppler with no significant abnormalities.   Patient feels slightly better after starting oral Valium.   Seen by ENT specialist, also had  a flexible scoping of her nose and sinuses, seen some chronic changes, but nothing to explain her symptoms.  Suggest to start mometasone nasal spray daily for one month and follow up in office for.   Appreciated physical therapy consult.   Started oral meclizine.   Neurology consult appreciated, meclizine not helping much- change to oral valium.    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  * Hypothyroidism   Continue levothyroxine.   Checked TSH.  * Hyperlipidemia   Continue pravastatin.  All the records are reviewed and case discussed with Care Management/Social Workerr. Management plans discussed with the patient, family and they are in agreement.  CODE STATUS: Full code.  TOTAL TIME TAKING CARE OF THIS PATIENT: 35 minutes.   Patient's husband,was present in the room during my visit.  POSSIBLE D/C IN 1-2 DAYS, DEPENDING ON CLINICAL CONDITION.   Vaughan Basta M.D on 07/10/2016   Between 7am to 6pm - Pager - [phone]  After 6pm go to [email or web address] - password [email or web address] [facility]  Office  [phone]  CC: Primary care physician; Coral Spikes, DO  Note: This dictation was prepared with Dragon dictation along with smaller phrase technology. Any transcriptional errors that result from this process are unintentional.

## 2016-07-10 NOTE — Progress Notes (Signed)
Pt still reporting episodes of near syncope/"black-out episodes". Pt daughter stated she felt her pulse and it was a little irregular during the first episode of this shift (while she was in upright position).The Irregularity was noted on the telemetry monitor as well. However, since this incident at 8:21pm, no other irregularities noted on the telemetry monitor and patient has reported 4 more near syncope episodes since. VS remain stable. No SOB, no c/o pain except slight headaches that almost immediately subside after each episode. Pt currently resting in bed. NSR on monitor. No episodes reported since 10:47pm. Will continue to monitor.

## 2016-07-10 NOTE — Progress Notes (Signed)
Patient continues to episode of feeling like a "black cloud is coming down" on her when sitting up quickly, turning her head quickly and when getting up.  Episodes are better just slightly as the day has progressed

## 2016-07-10 NOTE — Progress Notes (Signed)
Physical Therapy Treatment Patient Details Name: Sandra Horton MRN: [id number] DOB: 03/18/1951 Today's Date: 07/10/2016    History of Present Illness Sandra Horton is a 66 y.o. female has a past medical history significant for anxiety/depression, chronic neck and back pain, and thyroid disease now with acute onset near syncope associated with positional vertigo. No focal numbness or weakness. No change in speech or swallowing. Sx's worse with movement. Some relief with Ativan given in ER. Head CT negative. VSS. She is now admitted    PT Comments    Pt reports no improvement in symptoms since admission. She continues to describe symptoms as feeling like she is going to "pass out" and feeling like her "vision is going dark" when she changes position. Pt denies any vertigo or spinning sensations. She states that she has had vertigo in the past and this is very different. Pt becomes symptomatic during supine to sitting position changes but not with sitting to standing. However she reports feeling like she is going to "black out" when attempting ambulation. Orthostatic vitals obtained during bed mobility and transfers and are negative. Bedside vestibular examination including Dix-Hallpike testing was negative on 07/08/16 but decided to repeat Dix-Hallpike testing on this date. Dix-Hallpike is negative on the R side however is positive today on the L side for L posterior canal BPPV. During L Dix-Hallpike test she presents with appropriate upbeating L torsional nystagmus, 20-30 beats, 20 second duration, with 10/10 intensity of concurrent symptoms of "feeling like she is going to pass out." She continues to deny vertigo. Her subjective report of symptoms is very abnormal for BPPV however she responds exceedingly well on this date to the Epley maneuver. Pt taken through two rounds of the Epley maneuver and during Dix-Hallpike position of second maneuver she reports complete resolution of her symptoms. Following  Epley maneuver pt is able to stand ambulate one quarter of the way around the RN station and back to bed. She is able to perform horizontal head turns and only reports one brief reproduction of symptoms with R head turn but this resolves within 2-3 seconds. Pt reports dramatic improvement in her symptoms following treatment. Therapist plans to return tomorrow AM to take pt through another session of Epley maneuver prior to discharge. She should follow-up with vestibular therapy at [facility] following discharge for continued canalith repositioning until symptoms have completely resolved.    Follow Up Recommendations  Outpatient PT;Other (comment) (Vestibular therapy at [facility] for L posterior canal BPPV)     Equipment Recommendations  None recommended by PT;Other (comment) (Pt should use RW at discharge for added stability)    Recommendations for Other Services       Precautions / Restrictions Precautions Precautions: Fall Restrictions Weight Bearing Restrictions: No    Mobility  Bed Mobility Overal bed mobility: Needs Assistance Bed Mobility: Sit to Supine;Supine to Sit     Supine to sit: Supervision Sit to supine: Supervision   General bed mobility comments: Pt demonstrates improved sequencing with bed mobility. She continues to report feeling like she is going to pass out and like her vision is getting dark once she sits up at EOB. Resolves within 60 seconds. No nystagmus observed and no LOC  Transfers Overall transfer level: Needs assistance Equipment used: Rolling walker (2 wheeled) Transfers: Sit to/from Stand Sit to Stand: Min guard         General transfer comment: Pt requires continual guarding from therapist during transfer. Once upright she reports feeling alright without increase in  symptoms. Orthostatic vitals obtained from supine to sitting and sitting to standing and are negative.   Ambulation/Gait Ambulation/Gait assistance: Min assist Ambulation Distance (Feet): 120  Feet Assistive device: Rolling walker (2 wheeled)   Gait velocity: Decreased Gait velocity interpretation: <1.8 ft/sec, indicative of risk for recurrent falls General Gait Details: Initially pt ambulates with therapist 3' away from bed and reports that she feels like she is going to pass out and like her vision is going dark. Pt requires quick assist back to bed but never experiences LOC. After Epley performed pt is able to ambulate one quarter of the way around the RN station and back to bed. Able to perform horizontal head turns and only reports one brief sensation of symptoms with R head turn but resolves quickly. Gait speed starts slow and gradually increases with distances   Stairs            Wheelchair Mobility    Modified Rankin (Stroke Patients Only)       Balance Overall balance assessment: Needs assistance Sitting-balance support: No upper extremity supported Sitting balance-Leahy Scale: Good     Standing balance support: No upper extremity supported Standing balance-Leahy Scale: Fair                      Cognition Arousal/Alertness: Awake/alert Behavior During Therapy: WFL for tasks assessed/performed Overall Cognitive Status: Within Functional Limits for tasks assessed                      Exercises      General Comments        Pertinent Vitals/Pain Pain Assessment: No/denies pain    Home Living                      Prior Function            PT Goals (current goals can now be found in the care plan section) Acute Rehab PT Goals Patient Stated Goal: Return to prior function at home PT Goal Formulation: With patient/family Time For Goal Achievement: 07/22/16 Potential to Achieve Goals: Good Progress towards PT goals: Progressing toward goals    Frequency    Min 2X/week      PT Plan Current plan remains appropriate    Co-evaluation             End of Session Equipment Utilized During Treatment: Gait  belt Activity Tolerance: Patient tolerated treatment well Patient left: in bed;with call bell/phone within reach;with bed alarm set;with family/visitor present     Time: [id number] PT Time Calculation (min) (ACUTE ONLY): 45 min  Charges:  $Gait Training: 8-22 mins $Therapeutic Exercise: 8-22 mins $Canalith Rep Proc: 8-22 mins                    G Codes:      Lyndel Safe Terrea Bruster PT, DPT   Kenzy Campoverde 07/10/2016, 6:34 PM

## 2016-07-11 ENCOUNTER — Inpatient Hospital Stay (HOSPITAL_COMMUNITY)
Admit: 2016-07-11 | Discharge: 2016-07-11 | Disposition: A | Discharge status: Home / Self Care | Payer: BLUE CROSS/BLUE SHIELD | Attending: Internal Medicine | Admitting: Internal Medicine

## 2016-07-11 DIAGNOSIS — R42 Dizziness and giddiness: Secondary | ICD-10-CM | POA: Diagnosis present

## 2016-07-11 DIAGNOSIS — R55 Syncope and collapse: Principal | ICD-10-CM

## 2016-07-11 DIAGNOSIS — Z823 Family history of stroke: Secondary | ICD-10-CM | POA: Diagnosis not present

## 2016-07-11 DIAGNOSIS — Z7982 Long term (current) use of aspirin: Secondary | ICD-10-CM | POA: Diagnosis not present

## 2016-07-11 DIAGNOSIS — Z887 Allergy status to serum and vaccine status: Secondary | ICD-10-CM | POA: Diagnosis not present

## 2016-07-11 DIAGNOSIS — R110 Nausea: Secondary | ICD-10-CM | POA: Diagnosis present

## 2016-07-11 DIAGNOSIS — Z79891 Long term (current) use of opiate analgesic: Secondary | ICD-10-CM | POA: Diagnosis not present

## 2016-07-11 DIAGNOSIS — B009 Herpesviral infection, unspecified: Secondary | ICD-10-CM | POA: Diagnosis present

## 2016-07-11 DIAGNOSIS — Z833 Family history of diabetes mellitus: Secondary | ICD-10-CM | POA: Diagnosis not present

## 2016-07-11 DIAGNOSIS — Z880 Allergy status to penicillin: Secondary | ICD-10-CM | POA: Diagnosis not present

## 2016-07-11 DIAGNOSIS — L719 Rosacea, unspecified: Secondary | ICD-10-CM | POA: Diagnosis present

## 2016-07-11 DIAGNOSIS — Z791 Long term (current) use of non-steroidal anti-inflammatories (NSAID): Secondary | ICD-10-CM | POA: Diagnosis not present

## 2016-07-11 DIAGNOSIS — G8929 Other chronic pain: Secondary | ICD-10-CM | POA: Diagnosis present

## 2016-07-11 DIAGNOSIS — J338 Other polyp of sinus: Secondary | ICD-10-CM | POA: Diagnosis present

## 2016-07-11 DIAGNOSIS — E039 Hypothyroidism, unspecified: Secondary | ICD-10-CM | POA: Diagnosis present

## 2016-07-11 DIAGNOSIS — Z79899 Other long term (current) drug therapy: Secondary | ICD-10-CM | POA: Diagnosis not present

## 2016-07-11 DIAGNOSIS — F418 Other specified anxiety disorders: Secondary | ICD-10-CM | POA: Diagnosis present

## 2016-07-11 DIAGNOSIS — E7800 Pure hypercholesterolemia, unspecified: Secondary | ICD-10-CM | POA: Diagnosis present

## 2016-07-11 DIAGNOSIS — M810 Age-related osteoporosis without current pathological fracture: Secondary | ICD-10-CM | POA: Diagnosis present

## 2016-07-11 DIAGNOSIS — Z8249 Family history of ischemic heart disease and other diseases of the circulatory system: Secondary | ICD-10-CM | POA: Diagnosis not present

## 2016-07-11 DIAGNOSIS — Z8349 Family history of other endocrine, nutritional and metabolic diseases: Secondary | ICD-10-CM | POA: Diagnosis not present

## 2016-07-11 DIAGNOSIS — K219 Gastro-esophageal reflux disease without esophagitis: Secondary | ICD-10-CM | POA: Diagnosis present

## 2016-07-11 DIAGNOSIS — M8580 Other specified disorders of bone density and structure, unspecified site: Secondary | ICD-10-CM | POA: Diagnosis present

## 2016-07-11 DIAGNOSIS — Z8041 Family history of malignant neoplasm of ovary: Secondary | ICD-10-CM | POA: Diagnosis not present

## 2016-07-11 LAB — ECHOCARDIOGRAM COMPLETE
HEIGHTINCHES: 64 in
WEIGHTICAEL: 2028.8 [oz_av]

## 2016-07-11 LAB — GLUCOSE, CAPILLARY: Glucose-Capillary: 74 mg/dL (ref 65–99)

## 2016-07-11 MED ORDER — LEVOTHYROXINE SODIUM 75 MCG PO TABS
75.0000 ug | ORAL_TABLET | Freq: Every day | ORAL | Status: DC
  Administered 2016-07-12: 75 ug via ORAL
  Filled 2016-07-11: qty 1

## 2016-07-11 MED ORDER — ENOXAPARIN SODIUM 40 MG/0.4ML ~~LOC~~ SOLN
40.0000 mg | SUBCUTANEOUS | Status: DC
  Administered 2016-07-11: 40 mg via SUBCUTANEOUS
  Filled 2016-07-11: qty 0.4

## 2016-07-11 NOTE — Progress Notes (Signed)
Subjective: Patient did well after Epley maneuver on yesterday.  Today had return of her symptoms.  On valium with increased dose.  Patient concerned about addiction.    Objective: Current vital signs: BP 102/73   Pulse 72   Temp 97.3 F (36.3 C) (Oral)   Resp (!) 22   Ht 5\' 4"  (1.626 m)   Wt 57.5 kg (126 lb 12.8 oz)   SpO2 100%   BMI 21.77 kg/m  Vital signs in last 24 hours: Temp:  [97.3 F (36.3 C)-97.9 F (36.6 C)] 97.3 F (36.3 C) (02/08 0755) Pulse Rate:  [62-98] 72 (02/08 1222) Resp:  [16-22] 22 (02/08 0755) BP: (92-122)/(55-100) 102/73 (02/08 1222) SpO2:  [98 %-100 %] 100 % (02/08 1222) Weight:  [57.5 kg (126 lb 12.8 oz)] 57.5 kg (126 lb 12.8 oz) (02/08 0500)  Intake/Output from previous day: 02/07 0701 - 02/08 0700 In: 1200 [P.O.:1200] Out: -  Intake/Output this shift: Total I/O In: 600 [P.O.:600] Out: -  Nutritional status: Diet Heart Room service appropriate? Yes; Fluid consistency: Thin  Neurologic Exam: Mental Status: Alert, oriented, thought content appropriate.  Speech fluent without evidence of aphasia.  Able to follow 3 step commands without difficulty. Cranial Nerves: II: Discs flat bilaterally; Visual fields grossly normal, pupils equal, round, reactive to light and accommodation III,IV, VI: ptosis not present, extra-ocular motions intact bilaterally V,VII: smile symmetric, facial light touch sensation normal bilaterally VIII: hearing normal bilaterally IX,X: gag reflex present XI: bilateral shoulder shrug XII: midline tongue extension Motor: Right :  Upper extremity   5/5                                      Left:     Upper extremity   5/5             Lower extremity   5/5                                                  Lower extremity   5/5 Tone and bulk:normal tone throughout; no atrophy noted Sensory: Pinprick and light touch intact throughout, bilaterally Deep Tendon Reflexes: 2+ and symmetric throughout  Lab Results: Basic Metabolic  Panel:  Recent Labs Lab 07/07/16 0859 07/08/16 0525  NA 142 141  K 4.3 3.6  CL 110 113*  CO2 26 22  GLUCOSE 110* 66  BUN 15 13  CREATININE 0.78 0.66  CALCIUM 9.0 8.6*    Liver Function Tests:  Recent Labs Lab 07/07/16 0859 07/08/16 0525  AST 19 17  ALT 17 15  ALKPHOS 43 42  BILITOT 0.7 0.9  PROT 6.5 5.8*  ALBUMIN 3.7 3.2*   No results for input(s): LIPASE, AMYLASE in the last 168 hours. No results for input(s): AMMONIA in the last 168 hours.  CBC:  Recent Labs Lab 07/07/16 0859 07/08/16 0525  WBC 3.5* 3.4*  NEUTROABS 2.1  --   HGB 12.3 11.6*  HCT 35.4 34.7*  MCV 94.1 95.9  PLT 165 150    Cardiac Enzymes:  Recent Labs Lab 07/07/16 0859  TROPONINI <0.03    Lipid Panel: No results for input(s): CHOL, TRIG, HDL, CHOLHDL, VLDL, LDLCALC in the last 168 hours.  CBG:  Recent Labs Lab 07/08/16 0913 07/08/16 1141 07/09/16 0825 07/10/16  [id number] 07/11/16 0757  GLUCAP 148* 128* 117* 59 74    Microbiology: Results for orders placed or performed in visit on 06/14/13  Influenza A&B Antigens [facility])     Status: None   Collection Time: 06/14/13 12:12 PM  Result Value Ref Range Status   Micro Text Report   Final       SOURCE: nasal    COMMENT                   NEGATIVE FOR INFLUENZA A (ANTIGEN ABSENT)   COMMENT                   NEGATIVE FOR INFLUENZA B (ANTIGEN ABSENT)   ANTIBIOTIC                                                        Coagulation Studies: No results for input(s): LABPROT, INR in the last 72 hours.  Imaging: No results found.  Medications:  I have reviewed the patient's current medications. Scheduled: . aspirin EC  81 mg Oral Daily  . diazepam  5 mg Oral TID  . docusate sodium  100 mg Oral BID  . fluticasone  2 spray Each Nare Daily  . heparin  5,000 Units Subcutaneous Q8H  . levothyroxine  100 mcg Oral QAC breakfast  . loratadine  10 mg Oral Daily  . magnesium chloride  1 tablet Oral Daily  . meloxicam  15 mg Oral  Daily  . pantoprazole  40 mg Oral Daily  . PARoxetine  20 mg Oral QPM  . polyethylene glycol  17 g Oral Daily  . pravastatin  40 mg Oral q1800  . sodium chloride flush  3 mL Intravenous Q12H  . valACYclovir  500 mg Oral Daily  . vitamin B-12  500 mcg Oral Daily  . vitamin C  500 mg Oral Daily    Assessment/Plan: Patient remains symptomatic.  Discussed vestibular exercises and trained patient to perform them.  May benefit from an increase of Valium as well.   Recommendations: 1.  Increase valium to 7.5mg  prn 2.  Continue vestibular exercises TID.  Would anticipate discharge on tomorrow.   LOS: 0 days   Alexis Goodell, MD Neurology [phone] 07/11/2016  1:40 PM

## 2016-07-11 NOTE — Progress Notes (Signed)
Physical Therapy Treatment Patient Details Name: Sandra Horton MRN: [id number] DOB: 10-10-1950 Today's Date: 07/11/2016    History of Present Illness Sandra Horton is a 66 y.o. female has a past medical history significant for anxiety/depression, chronic neck and back pain, and thyroid disease now with acute onset near syncope associated with positional vertigo. No focal numbness or weakness. No change in speech or swallowing. Sx's worse with movement. Some relief with Ativan given in ER. Head CT negative. VSS. She is now admitted    PT Comments    Pt reports that she felt well all night last night. She was able to ambulate around the RN station. However she reports one bout of symptoms this morning when she got up to walk to the bathroom. Repeated Dix-Hallpike test today. On the R side she reports onset of "darkness like she is going to black out" 4/10 intensity but no observable nystagmus. No symptoms reported on the L side with Dix-hallpike testing and no observable nystagmus. However with each return to sitting she reports onset of symptoms again of feeling like she is going to balckout. Decided to treat the L posterior canal with Epley maneuver again today as she had a positive Dix-Hallpike test yesterday evening and responded extremely well to the Epley maneuver. Pt taken through 2 bouts of Epley maneuver for L posterior canal BPPV and held in each position for 2 minutes to ensure full clearance of debris. After Epley maneuver pt has no improvement of symptoms on this date. She continues to report feelings like she is going to "black out" every time she changes position. Pt also complains of symptoms with VBI screening at end range cervical rotation and extension but no nystagmus is observed. Frenzel lenses used with patient and she appears to have a very mild L torsional nystagmus with left gaze. Negative VOR, VOR thrust, and post-headshake nystagmus. She is able to ambulate 1 lap around RN station  but has multiple presyncopal episodes every time she turns her head vertically or horizontally. At times symptoms occur spontaneously as well. When pt has almost returned to her room she has to sit down because she is fearful of passing out. Pt placed in chair however she is unable to stand again despite repeated attempts due to feelings like her vision is fading and she is going to pass out. Vitals taken and are WNL. RN comes to assist and brings recliner so that pt can be wheeled back into room. Pt does not respond to canalith repositioning on this date. MD paged and notified of continued complaints by pt of feeling like she is going to "pass out" and her "vision is going dark" during PT session. Carotid dopplers were negative but symptoms concerning for posterior circulation issue especially given onset of symptoms during end range cervical rotation/extension. Pt will benefit from skilled PT services to address deficits in strength, balance, and mobility in order to return to full function at home.     Once again, her presentation is very atypical   Follow Up Recommendations  Outpatient PT;Other (comment) (Vestibular, If symptoms persist and no cause is identified)     Equipment Recommendations  None recommended by PT;Other (comment) (Pt should use RW at discharge for added stability)    Recommendations for Other Services       Precautions / Restrictions Precautions Precautions: Fall Restrictions Weight Bearing Restrictions: No    Mobility  Bed Mobility Overal bed mobility: Needs Assistance Bed Mobility: Sit to Supine;Supine to Sit  Supine to sit: Supervision Sit to supine: Supervision   General bed mobility comments: Pt continues to reports feeling like she is going to "black out" every time she sits up in bed. She is able to perform without external assistance but once sitting is fearful of passig out  Transfers Overall transfer level: Needs assistance Equipment used: Rolling  walker (2 wheeled) Transfers: Sit to/from Stand Sit to Stand: Min assist         General transfer comment: Pt consistently becomes lightheaded when standing. This occurs with and without head turns. She demonstrates good LE strength but poor stability due to feeling presyncopal. Pt very consistently reports symptoms with transfers  Ambulation/Gait Ambulation/Gait assistance: Min assist Ambulation Distance (Feet): 220 Feet Assistive device: Rolling walker (2 wheeled) Gait Pattern/deviations: Decreased step length - right;Decreased step length - left Gait velocity: Decreased Gait velocity interpretation: <1.8 ft/sec, indicative of risk for recurrent falls General Gait Details: Pt able to ambulate a full lap around the RN station with therapist. However she reprots lightheadedness and feelings like she is going to "black out" every time she attempts to turn her head either horizontally or vertically. Pt intermittently unstable requiring minA+1 due to feelings of lightheadness   Stairs            Wheelchair Mobility    Modified Rankin (Stroke Patients Only)       Balance Overall balance assessment: Needs assistance Sitting-balance support: No upper extremity supported Sitting balance-Leahy Scale: Good     Standing balance support: No upper extremity supported Standing balance-Leahy Scale: Fair                      Cognition Arousal/Alertness: Awake/alert Behavior During Therapy: WFL for tasks assessed/performed Overall Cognitive Status: Within Functional Limits for tasks assessed                      Exercises      General Comments        Pertinent Vitals/Pain Pain Assessment: No/denies pain    Home Living                      Prior Function            PT Goals (current goals can now be found in the care plan section) Acute Rehab PT Goals Patient Stated Goal: Return to prior function at home PT Goal Formulation: With  patient/family Time For Goal Achievement: 07/22/16 Potential to Achieve Goals: Good Progress towards PT goals: Progressing toward goals    Frequency    Min 2X/week      PT Plan Current plan remains appropriate    Co-evaluation             End of Session Equipment Utilized During Treatment: Gait belt Activity Tolerance: Patient tolerated treatment well Patient left: in bed;with call bell/phone within reach;with bed alarm set;with family/visitor present;with nursing/sitter in room     Time: 0830-0950 PT Time Calculation (min) (ACUTE ONLY): 80 min  Charges:  $Gait Training: 8-22 mins $Therapeutic Activity: 23-37 mins $Canalith Rep Proc: 8-22 mins                    G Codes:      Lyndel Safe Huprich PT, DPT   Huprich,Jason 07/11/2016, 10:09 AM

## 2016-07-11 NOTE — Progress Notes (Signed)
[place] [facility] [place] NAME: Sandra Horton    MR#:  [id number]  DATE OF BIRTH:  02/12/1951  SUBJECTIVE:  CHIEF COMPLAINT:   Chief Complaint  Patient presents with  . Near Syncope  Patient remains symptomatic (unable to stand longer than few seconds without feeling dizzy). Daughter at bedside.  REVIEW OF SYSTEMS:  CONSTITUTIONAL: No fever, fatigue or weakness.  EYES: No blurred or double vision.  EARS, NOSE, AND THROAT: No tinnitus or ear pain.  RESPIRATORY: No cough, shortness of breath, wheezing or hemoptysis.  CARDIOVASCULAR: No chest pain, orthopnea, edema.  GASTROINTESTINAL: No nausea, vomiting, diarrhea or abdominal pain.  GENITOURINARY: No dysuria, hematuria.  ENDOCRINE: No polyuria, nocturia,  HEMATOLOGY: No anemia, easy bruising or bleeding SKIN: No rash or lesion. MUSCULOSKELETAL: No joint pain or arthritis.   NEUROLOGIC: No tingling, numbness, weakness.  PSYCHIATRY: No anxiety or depression.   ROS  DRUG ALLERGIES:   Allergies  Allergen Reactions  . Penicillin V Potassium Nausea And Vomiting  . Penicillins   . Tetanus Toxoid Nausea And Vomiting    VITALS:  Blood pressure 102/73, pulse 72, temperature 97.3 F (36.3 C), temperature source Oral, resp. rate (!) 22, height 5\' 4"  (1.626 m), weight 57.5 kg (126 lb 12.8 oz), SpO2 100 %.  PHYSICAL EXAMINATION:  GENERAL:  66 y.o.-year-old patient lying in the bed with no acute distress.  EYES: Pupils equal, round, reactive to light and accommodation. No scleral icterus. Extraocular muscles intact.  HEENT: Head atraumatic, normocephalic. Oropharynx and nasopharynx clear.  NECK:  Supple, no jugular venous distention. No thyroid enlargement, no tenderness.  LUNGS: Normal breath sounds bilaterally, no wheezing, rales,rhonchi or crepitation. No use of accessory muscles of respiration.  CARDIOVASCULAR: S1, S2 normal. No murmurs, rubs, or gallops.  ABDOMEN: Soft, nontender, nondistended.  Bowel sounds present. No organomegaly or mass.  EXTREMITIES: No pedal edema, cyanosis, or clubbing.  NEUROLOGIC: Cranial nerves II through XII are intact. Muscle strength 5/5 in all extremities. Sensation intact. Gait not checked. Coordination checked in both upper limb and lower limbs and it is satisfactory. PSYCHIATRIC: The patient is alert and oriented x 3.  SKIN: No obvious rash, lesion, or ulcer.   Physical Exam LABORATORY PANEL:   CBC  Recent Labs Lab 07/08/16 0525  WBC 3.4*  HGB 11.6*  HCT 34.7*  PLT 150   ------------------------------------------------------------------------------------------------------------------  Chemistries   Recent Labs Lab 07/08/16 0525  NA 141  K 3.6  CL 113*  CO2 22  GLUCOSE 66  BUN 13  CREATININE 0.66  CALCIUM 8.6*  AST 17  ALT 15  ALKPHOS 42  BILITOT 0.9    RADIOLOGY:  No results found. ASSESSMENT AND PLAN:  Principal Problem:   Near syncope Active Problems:   Hypothyroidism   Chronic neck and back pain   Vertigo   Syncope  * Near syncope, vertigo: no exact etio known yet. - All work up essentially neg. - d/w Neuro, EP & ENT as patient still quite symptomatic - Appreciate all consultant's input and reeval as pt/family concerned - Dr Caryl Comes recommended rechecking orthostatics, EKG, check echo - Unlikely cardiac - Neuro recommends Increasing valium to 7.5mg  prn - patient agreeable. - Continue vestibular exercises TID as that seem to be working - Dr Kathyrn Sheriff to see - may need outpt ENT f/up after D/C tomorrow if not getting better    * History of recurrent herpes   On valcyclovir chronically.  * Hypothyroidism   Continue levothyroxine. - TSH  somewhat low suggestive of over replacement - cut back on dose (100->75)  * Hyperlipidemia   Continue pravastatin.   All the records are reviewed and case discussed with Care Management/Social Worker. Management plans discussed with the patient, family (Daughter at bedside, Dr  Caryl Comes, Dr Kathyrn Sheriff, Dr Saul Fordyce they are in agreement.  CODE STATUS: Full code.  TOTAL TIME TAKING CARE OF THIS PATIENT: 35 minutes.   Patient's husband,was present in the room during my visit.  POSSIBLE D/C IN AM tomorrow, DEPENDING ON CLINICAL CONDITION. AND further eval    Max Sane M.D on 07/11/2016   Between 7am to 6pm - Pager - [phone]  After 6pm go to [email or web address] - password [email or web address] [facility]  Office  [phone]  CC: Primary care physician; Coral Spikes, DO  Note: This dictation was prepared with Dragon dictation along with smaller phrase technology. Any transcriptional errors that result from this process are unintentional.

## 2016-07-11 NOTE — Consult Note (Signed)
ELECTROPHYSIOLOGY CONSULT NOTE  Patient ID: Sandra Horton, MRN: [id number], DOB/AGE: [birth year] 66 y.o. Admit date: 07/07/2016 Date of Consult: 07/11/2016  Primary Physician: Coral Spikes, DO Primary Cardiologist: new  Chief Complaint: spells   HPI Sandra Horton is a 66 y.o. female  seen for spells that began abruptly about 2 weeks ago. She describes them as presyncopal with her loss of vision pending. The were aggravated interestingly however largely by changes in head position with head extension and/or rotation. There is a much less positional component to it.  She has no history of cardiovascular disease and no history of exercise intolerance. She's had no associated palpitations. She had been evaluated thus far by ENT and neurology with variable interpretations. Past Medical History:  Diagnosis Date  . Allergy   . Anxiety   . Depression   . GERD (gastroesophageal reflux disease)   . High cholesterol   . Hyperlipidemia   . Osteoporosis   . Rosacea   . Thyroid disease   . Vertigo       Surgical History:  Past Surgical History:  Procedure Laterality Date  . COLONOSCOPY    . DILATION AND CURETTAGE OF UTERUS  30 years ago     Home Meds: Prior to Admission medications   Medication Sig Start Date End Date Taking? Authorizing Provider  aspirin 81 MG tablet Take 81 mg by mouth daily.   Yes Historical Provider, MD  BIOTIN PO Take 1 tablet by mouth daily.   Yes Historical Provider, MD  cetirizine (ZYRTEC) 10 MG tablet Take 10 mg by mouth daily.   Yes Historical Provider, MD  docusate sodium (COLACE) 100 MG capsule Take 100 mg by mouth 2 (two) times daily as needed for mild constipation.   Yes Historical Provider, MD  ESTRACE VAGINAL 0.1 MG/GM vaginal cream Place 1 application vaginally once a week. 04/04/16  Yes Historical Provider, MD  levothyroxine (SYNTHROID, LEVOTHROID) 100 MCG tablet TAKE ONE TABLET BY MOUTH ONCE DAILY 05/06/16  Yes Coral Spikes, DO  lovastatin (MEVACOR) 40  MG tablet Take 1 tablet (40 mg total) by mouth daily. 02/22/16  Yes Jayce G Cook, DO  magnesium chloride (SLOW-MAG) 64 MG TBEC SR tablet Take 1 tablet by mouth.   Yes Historical Provider, MD  meloxicam (MOBIC) 15 MG tablet Take 1 tablet (15 mg total) by mouth daily. 05/03/16  Yes Coral Spikes, DO  omeprazole (PRILOSEC) 20 MG capsule Take 1 capsule (20 mg total) by mouth 2 (two) times daily. 02/22/16  Yes Coral Spikes, DO  PARoxetine (PAXIL) 20 MG tablet Take 1 tablet (20 mg total) by mouth daily. 05/03/16  Yes Coral Spikes, DO  polyethylene glycol (MIRALAX / GLYCOLAX) packet Take 17 g by mouth daily.   Yes Historical Provider, MD  valACYclovir (VALTREX) 500 MG tablet Take 500 mg by mouth daily.   Yes Historical Provider, MD  vitamin B-12 (CYANOCOBALAMIN) 500 MCG tablet Take 500 mcg by mouth daily.   Yes Historical Provider, MD  vitamin C (ASCORBIC ACID) 500 MG tablet Take 500 mg by mouth daily.   Yes Historical Provider, MD  albuterol (PROVENTIL HFA;VENTOLIN HFA) 108 (90 Base) MCG/ACT inhaler Inhale 2 puffs into the lungs every 6 (six) hours as needed for wheezing or shortness of breath. 06/13/16   Coral Spikes, DO  chlorpheniramine-HYDROcodone (TUSSIONEX PENNKINETIC ER) 10-8 MG/5ML SUER Take 5 mLs by mouth every 12 (twelve) hours as needed. 06/13/16   Coral Spikes, DO  predniSONE (  DELTASONE) 50 MG tablet 1 tablet daily x 5 days. 06/13/16   Coral Spikes, DO    Inpatient Medications:  . aspirin EC  81 mg Oral Daily  . diazepam  5 mg Oral TID  . docusate sodium  100 mg Oral BID  . enoxaparin (LOVENOX) injection  40 mg Subcutaneous Q24H  . fluticasone  2 spray Each Nare Daily  . [START ON 07/12/2016] levothyroxine  75 mcg Oral QAC breakfast  . loratadine  10 mg Oral Daily  . magnesium chloride  1 tablet Oral Daily  . meloxicam  15 mg Oral Daily  . pantoprazole  40 mg Oral Daily  . PARoxetine  20 mg Oral QPM  . polyethylene glycol  17 g Oral Daily  . pravastatin  40 mg Oral q1800  . sodium chloride  flush  3 mL Intravenous Q12H  . valACYclovir  500 mg Oral Daily  . vitamin B-12  500 mcg Oral Daily  . vitamin C  500 mg Oral Daily     Allergies:  Allergies  Allergen Reactions  . Penicillin V Potassium Nausea And Vomiting  . Penicillins   . Tetanus Toxoid Nausea And Vomiting    Social History   Social History  . Marital status: Married    Spouse name: N/A  . Number of children: N/A  . Years of education: N/A   Occupational History  . Not on file.   Social History Main Topics  . Smoking status: Never Smoker  . Smokeless tobacco: Never Used  . Alcohol use No  . Drug use: No  . Sexual activity: Yes   Other Topics Concern  . Not on file   Social History Narrative  . No narrative on file     Family History  Problem Relation Age of Onset  . Cancer Mother   . Diabetes Mother   . Thyroid disease Mother   . Hypertension Father   . Stroke Father   . Thyroid disease Sister   . Ovarian cancer Sister   . Diabetes Daughter      ROS:  Please see the history of present illness.     All other systems reviewed and negative.    Physical Exam: Blood pressure 102/73, pulse 72, temperature 97.3 F (36.3 C), temperature source Oral, resp. rate (!) 22, height 5\' 4"  (1.626 m), weight 126 lb 12.8 oz (57.5 kg), SpO2 100 %. General: Well developed, well nourished female in no acute distress. Head: Normocephalic, atraumatic, sclera non-icteric, no xanthomas, nares are without discharge. EENT: normal Lymph Nodes:  none Back: without scoliosis/kyphosis, no CVA tendersness Neck: Negative for carotid bruits. JVD not elevated. Lungs: Clear bilaterally to auscultation without wheezes, rales, or rhonchi. Breathing is unlabored. Heart: RRR with S1 S2. No  murmur , rubs, or gallops appreciated. Abdomen: Soft, non-tender, non-distended with normoactive bowel sounds. No hepatomegaly. No rebound/guarding. No obvious abdominal masses. Msk:  Strength and tone appear normal for  age. Extremities: No clubbing or cyanosis. No edema.  Distal pedal pulses are 2+ and equal bilaterally. Skin: Warm and Dry Neuro: Alert and oriented X 3. CN III-XII intact Grossly normal sensory and motor function . Psych:  Responds to questions appropriately with a normal affect.      Labs: Cardiac Enzymes No results for input(s): CKTOTAL, CKMB, TROPONINI in the last 72 hours. CBC Lab Results  Component Value Date   WBC 3.4 (L) 07/08/2016   HGB 11.6 (L) 07/08/2016   HCT 34.7 (L) 07/08/2016   MCV  95.9 07/08/2016   PLT 150 07/08/2016   PROTIME: No results for input(s): LABPROT, INR in the last 72 hours. Chemistry  Recent Labs Lab 07/08/16 0525  NA 141  K 3.6  CL 113*  CO2 22  BUN 13  CREATININE 0.66  CALCIUM 8.6*  PROT 5.8*  BILITOT 0.9  ALKPHOS 42  ALT 15  AST 17  GLUCOSE 66   Lipids Lab Results  Component Value Date   CHOL 145 02/22/2016   HDL 58.90 02/22/2016   LDLCALC 71 02/22/2016   TRIG 76.0 02/22/2016   BNP No results found for: PROBNP Thyroid Function Tests: No results for input(s): TSH, T4TOTAL, T3FREE, THYROIDAB in the last 72 hours.  Invalid input(s): FREET3    Miscellaneous No results found for: DDIMER  Radiology/Studies:  Dg Chest 2 View  Result Date: 06/13/2016 CLINICAL DATA:  Cough, congestion, acute bronchitis, shortness of breath for 1 week EXAM: CHEST  2 VIEW COMPARISON:  None. FINDINGS: Cardiomediastinal silhouette is unremarkable. Mild hyperinflation. Mild perihilar bronchitic changes. No segmental infiltrate. Degenerative changes mid and lower thoracic spine. IMPRESSION: Mild hyperinflation. No infiltrate. Mild perihilar bronchitic changes. Degenerative changes thoracic spine Electronically Signed   By: Lahoma Crocker M.D.   On: 06/13/2016 08:56   Ct Head Wo Contrast  Result Date: 07/07/2016 CLINICAL DATA:  66 year old female with vertigo. Nausea and dizziness. Near syncope. Symptom onset this morning. Initial encounter. EXAM: CT HEAD  WITHOUT CONTRAST TECHNIQUE: Contiguous axial images were obtained from the base of the skull through the vertex without intravenous contrast. COMPARISON:  Brain MRI 03/24/2014.  Head CT 03/04/2014. FINDINGS: Brain: Cerebral volume remains normal for age. No midline shift, ventriculomegaly, mass effect, evidence of mass lesion, intracranial hemorrhage or evidence of cortically based acute infarction. Gray-white matter differentiation is within normal limits throughout the brain. Vascular: Mild Calcified atherosclerosis at the skull base. Skull: Stable.  No acute osseous abnormality identified. Sinuses/Orbits: Chronic posterior right nasal cavity polyp suspected, measuring about 17 mm where it protrudes into the anterior nasopharynx (series 3, image 1). This appears not significantly changed since 2015. Similar partially calcified polypoid lesion in the posterosuperior left nasal cavity, and associated Chronic left sphenoid sinusitis. Other paranasal sinuses are stable in clear. Bilateral tympanic cavities and mastoids appear clear. Other: No acute orbit or scalp soft tissue findings. IMPRESSION: 1. Stable and normal noncontrast CT appearance of the brain. 2. 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 Recommend ENT referral. Electronically Signed   By: Genevie Ann M.D.   On: 07/07/2016 13:48   Mr Brain Wo Contrast  Result Date: 07/08/2016 CLINICAL DATA:  66 year old female with vertigo and near syncope since 07/07/2016. Initial encounter. EXAM: MRI HEAD WITHOUT CONTRAST TECHNIQUE: Multiplanar, multiecho pulse sequences of the brain and surrounding structures were obtained without intravenous contrast. COMPARISON:  Head CT without contrast 07/07/2016. Brain MRI 03/24/2014. FINDINGS: Brain: Cerebral volume is stable since 2015 and within normal limits. No restricted  diffusion to suggest acute infarction. No midline shift, mass effect, evidence of mass lesion, ventriculomegaly, extra-axial collection or acute intracranial hemorrhage. Cervicomedullary junction and pituitary are within normal limits. Mild for age nonspecific mostly anterior frontal lobe white matter T2 and FLAIR hyperintensity is stable since 2015. No cortical encephalomalacia or chronic cerebral blood products. No new signal abnormality with negative deep gray matter nuclei, brainstem, and cerebellum. Vascular: Major intracranial vascular flow voids are stable  since 2015 with a degree of generalized tortuosity. Skull and upper cervical spine: Negative. Visualized bone marrow signal is within normal limits. Sinuses/Orbits: Stable and negative orbits soft tissues. Chronic T2 hyperintense polyp or mucosal hypertrophy in the posterior right nasal cavity (series 7, image 4), and partially calcified round polypoid area in the posterosuperior left nasal cavity are stable since 2015, but the latter may be the etiology of chronic left sphenoid sinusitis. Trace paranasal sinus mucosal thickening elsewhere is stable. Other: Trace left mastoid fluid is unchanged since 2015. Negative nasopharynx. Visible internal auditory structures appear normal. Negative scalp soft tissues. IMPRESSION: 1.  No acute intracranial abnormality. Stable since 2015 and largely unremarkable for age noncontrast MRI appearance of the brain. There is chronic arterial tortuosity, and minimal nonspecific frontal lobe white matter changes. 2. Benign chronic posterior nasal cavity polyps or mucosal hypertrophy in association with chronic left sphenoid sinusitis. Consider ENT follow-up. Electronically Signed   By: Genevie Ann M.D.   On: 07/08/2016 14:25   US Carotid Bilateral  Result Date: 07/07/2016 CLINICAL DATA:  Vertigo since this morning. EXAM: BILATERAL CAROTID DUPLEX ULTRASOUND TECHNIQUE: Pearline Cables scale imaging, color Doppler and duplex ultrasound were  performed of bilateral carotid and vertebral arteries in the neck. COMPARISON:  03/04/2014 carotid duplex study. FINDINGS: Criteria: Quantification of carotid stenosis is based on velocity parameters that correlate the residual internal carotid diameter with NASCET-based stenosis levels, using the diameter of the distal internal carotid lumen as the denominator for stenosis measurement. The following velocity measurements were obtained: RIGHT ICA:  56 cm/sec CCA:  85 cm/sec SYSTOLIC ICA/CCA RATIO:  [id number] DIASTOLIC ICA/CCA RATIO:  [id number] ECA:  63 cm/sec LEFT ICA:  71 cm/sec CCA:  73 cm/sec SYSTOLIC ICA/CCA RATIO:  [id number] DIASTOLIC ICA/CCA RATIO:  [id number] ECA:  73 cm/sec RIGHT CAROTID ARTERY: Mild calcified plaque at the right carotid bulb and bifurcation, with no significant right internal carotid artery stenosis on grayscale imaging. No hemodynamically significant right internal carotid artery stenosis by Doppler criteria. RIGHT VERTEBRAL ARTERY:  Patent with normal antegrade flow. LEFT CAROTID ARTERY: No significant plaque demonstrated at the left carotid bulb and bifurcation on grayscale imaging. No hemodynamically significant left internal carotid artery stenosis by Doppler criteria. LEFT VERTEBRAL ARTERY:  Patent with normal antegrade flow. IMPRESSION: 1. Mild calcified right and no significant left carotid plaque, with no hemodynamically significant carotid stenosis (0- 49%). 2. Normal antegrade flow in both vertebral arteries. Electronically Signed   By: Ilona Sorrel M.D.   On: 07/07/2016 16:48    EKG: Pending   Assessment and Plan:  Spells   Her spells of "presyncope" I do not think her cardiovascular. Symptoms were reproduced today with stable heart rhythm and while she was recumbent. An echocardiogram would exclude the possibility of intracardiac obstruction to flow which is unlikely;  these can cause symptoms of presyncope in the absence of rhythm changes. However, when she stood today with some symptoms  her blood pressure remained normal making any cardiovascular cause unlikely.  I have reviewed this with the family and have spoken with Dr. Doy Mince and Dr. Manuella Ghazi  I will follow-up on the echo and electrocardiogram   Virl Axe

## 2016-07-11 NOTE — Progress Notes (Signed)
*  PRELIMINARY RESULTS* Echocardiogram 2D Echocardiogram has been performed.  Sherrie Sport 07/11/2016, 4:02 PM

## 2016-07-12 ENCOUNTER — Telehealth: Payer: Self-pay | Admitting: *Deleted

## 2016-07-12 LAB — CBC
HEMATOCRIT: 40.2 % (ref 35.0–47.0)
HEMOGLOBIN: 13.6 g/dL (ref 12.0–16.0)
MCH: 32 pg (ref 26.0–34.0)
MCHC: 33.8 g/dL (ref 32.0–36.0)
MCV: 94.7 fL (ref 80.0–100.0)
Platelets: 187 10*3/uL (ref 150–440)
RBC: 4.24 MIL/uL (ref 3.80–5.20)
RDW: 13.5 % (ref 11.5–14.5)
WBC: 4.2 10*3/uL (ref 3.6–11.0)

## 2016-07-12 LAB — CREATININE, SERUM: CREATININE: 0.92 mg/dL (ref 0.44–1.00)

## 2016-07-12 LAB — GLUCOSE, CAPILLARY: Glucose-Capillary: 72 mg/dL (ref 65–99)

## 2016-07-12 MED ORDER — LEVOTHYROXINE SODIUM 88 MCG PO TABS: 88.0000 ug | ORAL_TABLET | Freq: Every day | ORAL | 1 refills | Status: DC

## 2016-07-12 MED ORDER — DIAZEPAM 5 MG PO TABS: 5.0000 mg | ORAL_TABLET | Freq: Two times a day (BID) | ORAL | 0 refills | Status: DC | PRN

## 2016-07-12 NOTE — Progress Notes (Signed)
Patient discharged to home. Iv site removed. Prescription given. Patient wanted to take medications scheduled at 1000 at home. Concerns addressed.

## 2016-07-12 NOTE — Progress Notes (Signed)
[facility] [place] was admitted to the Hospital on 07/07/2016 and Discharged  07/12/2016 and should be excused from work   for 10 days starting 07/07/2016 , may return to work without any restrictions After follow-up with ENT office per their recommendation.  Max Sane M.D on 07/12/2016,at 7:31 AM  [place] [facility]  [phone]

## 2016-07-12 NOTE — Final Consult Note (Signed)
Sandra Horton, Balderrama [id number] 09-Dec-1950 Max Sane, MD  Reason for Consult: Persisting disequilibrium  HPI: Patient has been in the hospital for 4 days now and continues to feel nauseous and ablation. Past so when she sits up. She doesn't seem to have the problem when she is laying flat. She has been reevaluated with cardiac issues and has no orthostatic changes. She has not had vertigo but symptoms are improving with Valium. She did have a positive Dix-Hallpike test by physical therapy and an Epley maneuver was done. She felt much better that night got up and walked around and didn't have any dizziness at all until the next morning when she tried getting up again.  Allergies:  Allergies  Allergen Reactions  . Penicillin V Potassium Nausea And Vomiting  . Penicillins   . Tetanus Toxoid Nausea And Vomiting    ROS: Review of systems normal other than 12 systems except per HPI.  PMH:  Past Medical History:  Diagnosis Date  . Allergy   . Anxiety   . Depression   . GERD (gastroesophageal reflux disease)   . High cholesterol   . Hyperlipidemia   . Osteoporosis   . Rosacea   . Thyroid disease   . Vertigo     FH:  Family History  Problem Relation Age of Onset  . Cancer Mother   . Diabetes Mother   . Thyroid disease Mother   . Hypertension Father   . Stroke Father   . Thyroid disease Sister   . Ovarian cancer Sister   . Diabetes Daughter     SH:  Social History   Social History  . Marital status: Married    Spouse name: N/A  . Number of children: N/A  . Years of education: N/A   Occupational History  . Not on file.   Social History Main Topics  . Smoking status: Never Smoker  . Smokeless tobacco: Never Used  . Alcohol use No  . Drug use: No  . Sexual activity: Yes   Other Topics Concern  . Not on file   Social History Narrative  . No narrative on file    PSH:  Past Surgical History:  Procedure Laterality Date  . COLONOSCOPY    . DILATION AND CURETTAGE OF  UTERUS  30 years ago    Physical  Exam: Patient lying In bed with no dizziness all currently. I have not gotten her up to stressor this morning. He has no change in her hearing and no other upper respiratory symptoms or signs right now.  A/P: She is had an unusual presentation of dizziness that appears to have some vestibular component. All the cardiac and neurologic workups have been negative. We will schedule for a VNG in our office as an outpatient as soon as possible to try to get a better handle on how the labyrinths are working. Once we get some better verification of once happening we can then formulate a vestibular rehabilitation plan for her. She will be discharged home today and will stay in the Valium as there may be some anxiety component with this as well. We'll plan to see her in the office next week and go over VNG results as soon as obtained.   Gaines Cartmell H 07/12/2016 7:25 AM

## 2016-07-12 NOTE — Progress Notes (Signed)
Subjective: Patient sitting up in bed today.  Although she remains symptomatic, her symptoms are less severe.  She continues her vestibular exercises.    Objective: Current vital signs: BP 98/66 (BP Location: Left Arm)   Pulse 70   Temp 97.5 F (36.4 C) (Oral)   Resp 18   Ht 5\' 4"  (1.626 m)   Wt 60.3 kg (132 lb 14.4 oz)   SpO2 99%   BMI 22.81 kg/m  Vital signs in last 24 hours: Temp:  [97.3 F (36.3 C)-97.9 F (36.6 C)] 97.5 F (36.4 C) (02/09 0828) Pulse Rate:  [63-98] 70 (02/09 0828) Resp:  [18] 18 (02/09 0828) BP: (95-117)/(51-94) 98/66 (02/09 0828) SpO2:  [98 %-100 %] 99 % (02/09 0422) Weight:  [60.3 kg (132 lb 14.4 oz)] 60.3 kg (132 lb 14.4 oz) (02/09 0422)  Intake/Output from previous day: 02/08 0701 - 02/09 0700 In: 1200 [P.O.:1200] Out: -  Intake/Output this shift: No intake/output data recorded. Nutritional status: Diet Heart Room service appropriate? Yes; Fluid consistency: Thin Diet - low sodium heart healthy  Neurologic Exam: Mental Status: Alert, oriented, thought content appropriate. Speech fluent without evidence of aphasia. Able to follow 3 step commands without difficulty. Cranial Nerves: II: Discs flat bilaterally; Visual fields grossly normal, pupils equal, round, reactive to light and accommodation III,IV, VI: ptosis not present, extra-ocular motions intact bilaterally V,VII: smile symmetric, facial light touch sensation normal bilaterally VIII: hearing normal bilaterally IX,X: gag reflex present XI: bilateral shoulder shrug XII: midline tongue extension Motor: Right :Upper extremity 5/5Left: Upper extremity 5/5 Lower extremity 5/5Lower extremity 5/5 Tone and bulk:normal tone throughout; no atrophy noted Sensory: Pinprick and light touch intact throughout, bilaterally Deep Tendon Reflexes: 2+ and symmetric throughout Gait: Slow and  guarded but narrow based   Lab Results: Basic Metabolic Panel:  Recent Labs Lab 07/07/16 0859 07/08/16 0525 07/12/16 0509  NA 142 141  --   K 4.3 3.6  --   CL 110 113*  --   CO2 26 22  --   GLUCOSE 110* 66  --   BUN 15 13  --   CREATININE 0.78 0.66 0.92  CALCIUM 9.0 8.6*  --     Liver Function Tests:  Recent Labs Lab 07/07/16 0859 07/08/16 0525  AST 19 17  ALT 17 15  ALKPHOS 43 42  BILITOT 0.7 0.9  PROT 6.5 5.8*  ALBUMIN 3.7 3.2*   No results for input(s): LIPASE, AMYLASE in the last 168 hours. No results for input(s): AMMONIA in the last 168 hours.  CBC:  Recent Labs Lab 07/07/16 0859 07/08/16 0525 07/12/16 0509  WBC 3.5* 3.4* 4.2  NEUTROABS 2.1  --   --   HGB 12.3 11.6* 13.6  HCT 35.4 34.7* 40.2  MCV 94.1 95.9 94.7  PLT 165 150 187    Cardiac Enzymes:  Recent Labs Lab 07/07/16 0859  TROPONINI <0.03    Lipid Panel: No results for input(s): CHOL, TRIG, HDL, CHOLHDL, VLDL, LDLCALC in the last 168 hours.  CBG:  Recent Labs Lab 07/08/16 1141 07/09/16 0825 07/10/16 0737 07/11/16 0757 07/12/16 0732  GLUCAP 128* 117* 71 74 72    Microbiology: Results for orders placed or performed in visit on 06/14/13  Influenza A&B Antigens [facility])     Status: None   Collection Time: 06/14/13 12:12 PM  Result Value Ref Range Status   Micro Text Report   Final       SOURCE: nasal    COMMENT  NEGATIVE FOR INFLUENZA A (ANTIGEN ABSENT)   COMMENT                   NEGATIVE FOR INFLUENZA B (ANTIGEN ABSENT)   ANTIBIOTIC                                                        Coagulation Studies: No results for input(s): LABPROT, INR in the last 72 hours.  Imaging: No results found.  Medications:  I have reviewed the patient's current medications. Scheduled: . aspirin EC  81 mg Oral Daily  . diazepam  5 mg Oral TID  . docusate sodium  100 mg Oral BID  . enoxaparin (LOVENOX) injection  40 mg Subcutaneous Q24H  . fluticasone  2  spray Each Nare Daily  . levothyroxine  75 mcg Oral QAC breakfast  . loratadine  10 mg Oral Daily  . magnesium chloride  1 tablet Oral Daily  . meloxicam  15 mg Oral Daily  . pantoprazole  40 mg Oral Daily  . PARoxetine  20 mg Oral QPM  . polyethylene glycol  17 g Oral Daily  . pravastatin  40 mg Oral q1800  . sodium chloride flush  3 mL Intravenous Q12H  . valACYclovir  500 mg Oral Daily  . vitamin B-12  500 mcg Oral Daily  . vitamin C  500 mg Oral Daily    Assessment/Plan: Patient improved. To continue vestibular exercises and prn Valium.  Patient to follow up with neurology and ENT on an outpatient basis.     LOS: 1 day   Alexis Goodell, MD Neurology [phone] 07/12/2016  9:57 AM

## 2016-07-12 NOTE — Telephone Encounter (Signed)
Pt will discharge from [facility] on 02/09, she was admitted for near syncope.

## 2016-07-12 NOTE — Discharge Instructions (Signed)
Near-Syncope °Introduction °Near-syncope is when you suddenly get weak or dizzy, or you feel like you might pass out (faint). During an episode of near-syncope, you may: °· Feel dizzy or light-headed. °· Feel sick to your stomach (nauseous). °· See all white or all black. °· Have cold, clammy skin. °If you passed out, get help right away.Call your local emergency services (911 in the [place] Do not drive yourself to the hospital. °Follow these instructions at home: °Pay attention to any changes in your symptoms. Take these actions to help with your condition: °· Have someone stay with you until you feel stable. °· Do not drive, use machinery, or play sports until your doctor says it is okay. °· Keep all follow-up visits as told by your doctor. This is important. °· If you start to feel like you might pass out, lie down right away and raise (elevate) your feet above the level of your heart. Breathe deeply and steadily. Wait until all of the symptoms are gone. °· Drink enough fluid to keep your pee (urine) clear or pale yellow. °· If you are taking blood pressure or heart medicine, get up slowly and spend many minutes getting ready to sit and then stand. This can help with dizziness. °· Take over-the-counter and prescription medicines only as told by your doctor. °Get help right away if: °· You have a very bad headache. °· You have unusual pain in your chest, tummy, or back. °· You are bleeding from your mouth or rectum. °· You have black or tarry poop (stool). °· You have a very fast or uneven heartbeat (palpitations). °· You pass out one time or more than once. °· You have jerky movements that you cannot control (seizure). °· You are confused. °· You have trouble walking. °· You are very weak. °· You have vision problems. °These symptoms may be an emergency. Do not wait to see if the symptoms will go away. Get medical help right away. Call your local emergency services (911 in the [place] Do not drive yourself to the  hospital.  °This information is not intended to replace advice given to you by your health care provider. Make sure you discuss any questions you have with your health care provider. °Document Released: 11/06/2007 Document Revised: 10/26/2015 Document Reviewed: 02/01/2015 °© 2017 Elsevier ° °

## 2016-07-13 NOTE — Discharge Summary (Signed)
[facility] NAME: Sandra Horton    MR#:  [id number]  DATE OF BIRTH:  09-Mar-1951  DATE OF ADMISSION:  07/07/2016   ADMITTING PHYSICIAN: Idelle Crouch, MD  DATE OF DISCHARGE: 07/12/2016 11:24 AM  PRIMARY CARE PHYSICIAN: Coral Spikes, DO   ADMISSION DIAGNOSIS:  Vertigo [R42] Near syncope [R55] DISCHARGE DIAGNOSIS:  Principal Problem:   Near syncope Active Problems:   Hypothyroidism   Chronic neck and back pain   Vertigo   Syncope  SECONDARY DIAGNOSIS:   Past Medical History:  Diagnosis Date  . Allergy   . Anxiety   . Depression   . GERD (gastroesophageal reflux disease)   . High cholesterol   . Hyperlipidemia   . Osteoporosis   . Rosacea   . Thyroid disease   . Vertigo    HOSPITAL COURSE:  66 y f with h/o anxiety, DEPRESSION, gerd had an unusual presentation of dizziness   * Near syncope, vertigo: no exact etio known yet. - All work up essentially neg. Seen by ENT, Neuro and Electrophysiologist. - Appears to have some vestibular component. All the cardiac and neurologic workups have been negative. ENT has schedule her for a VNG in our office as an outpatient as soon as possible to try to get a better handle on how the labyrinths are working. She may need vestibular rehabilitation based on her ENT eval.     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  * Hypothyroidism   Continue levothyroxine. - TSH somewhat low suggestive of over replacement - cut back on dose (100->27mcg) Recommend outpt recheck TFT in few weeks  DISCHARGE CONDITIONS:  stable CONSULTS OBTAINED:  Treatment Team:  Margaretha Sheffield, MD Alexis Goodell, MD DRUG ALLERGIES:   Allergies  Allergen Reactions  . Penicillin V Potassium Nausea And Vomiting  . Penicillins   . Tetanus Toxoid Nausea And Vomiting   DISCHARGE MEDICATIONS:   Allergies as of 07/12/2016      Reactions   Penicillin V Potassium Nausea And Vomiting   Penicillins    Tetanus Toxoid Nausea And Vomiting      Medication List    STOP taking these medications   predniSONE 50 MG tablet Commonly known as:  DELTASONE     TAKE these medications   albuterol 108 (90 Base) MCG/ACT inhaler Commonly known as:  PROVENTIL HFA;VENTOLIN HFA Inhale 2 puffs into the lungs every 6 (six) hours as needed for wheezing or shortness of breath.   aspirin 81 MG tablet Take 81 mg by mouth daily.   BIOTIN PO Take 1 tablet by mouth daily.   cetirizine 10 MG tablet Commonly known as:  ZYRTEC Take 10 mg by mouth daily.   chlorpheniramine-HYDROcodone 10-8 MG/5ML Suer Commonly known as:  TUSSIONEX PENNKINETIC ER Take 5 mLs by mouth every 12 (twelve) hours as needed.   diazepam 5 MG tablet Commonly known as:  VALIUM Take 1 tablet (5 mg total) by mouth every 12 (twelve) hours as needed for anxiety.   docusate sodium 100 MG capsule Commonly known as:  COLACE Take 100 mg by mouth 2 (two) times daily as needed for mild constipation.   ESTRACE VAGINAL 0.1 MG/GM vaginal cream Generic drug:  estradiol Place 1 application vaginally once a week.   levothyroxine 88 MCG tablet Commonly known as:  SYNTHROID, LEVOTHROID Take 1 tablet (88 mcg total) by mouth daily before breakfast. What changed:  See the new instructions.  lovastatin 40 MG tablet Commonly known as:  MEVACOR Take 1 tablet (40 mg total) by mouth daily.   magnesium chloride 64 MG Tbec SR tablet Commonly known as:  SLOW-MAG Take 1 tablet by mouth.   meloxicam 15 MG tablet Commonly known as:  MOBIC Take 1 tablet (15 mg total) by mouth daily.   omeprazole 20 MG capsule Commonly known as:  PRILOSEC Take 1 capsule (20 mg total) by mouth 2 (two) times daily.   PARoxetine 20 MG tablet Commonly known as:  PAXIL Take 1 tablet (20 mg total) by mouth daily.   polyethylene glycol packet Commonly known as:  MIRALAX / GLYCOLAX Take 17 g by mouth daily.   valACYclovir 500 MG tablet Commonly  known as:  VALTREX Take 500 mg by mouth daily.   vitamin B-12 500 MCG tablet Commonly known as:  CYANOCOBALAMIN Take 500 mcg by mouth daily.   vitamin C 500 MG tablet Commonly known as:  ASCORBIC ACID Take 500 mg by mouth daily.      DISCHARGE INSTRUCTIONS:   DIET:  Regular diet DISCHARGE CONDITION:  Good ACTIVITY:  Activity as tolerated OXYGEN:  Home Oxygen: No.  Oxygen Delivery: room air DISCHARGE LOCATION:  home   If you experience worsening of your admission symptoms, develop shortness of breath, life threatening emergency, suicidal or homicidal thoughts you must seek medical attention immediately by calling 911 or calling your MD immediately  if symptoms less severe.  You Must read complete instructions/literature along with all the possible adverse reactions/side effects for all the Medicines you take and that have been prescribed to you. Take any new Medicines after you have completely understood and accpet all the possible adverse reactions/side effects.   Please note  You were cared for by a hospitalist during your hospital stay. If you have any questions about your discharge medications or the care you received while you were in the hospital after you are discharged, you can call the unit and asked to speak with the hospitalist on call if the hospitalist that took care of you is not available. Once you are discharged, your primary care physician will handle any further medical issues. Please note that NO REFILLS for any discharge medications will be authorized once you are discharged, as it is imperative that you return to your primary care physician (or establish a relationship with a primary care physician if you do not have one) for your aftercare needs so that they can reassess your need for medications and monitor your lab values.    On the day of Discharge:  VITAL SIGNS:  Blood pressure 98/66, pulse 70, temperature 97.5 F (36.4 C), temperature source Oral,  resp. rate 18, height 5\' 4"  (1.626 m), weight 60.3 kg (132 lb 14.4 oz), SpO2 99 %. PHYSICAL EXAMINATION:  GENERAL:  66 y.o.-year-old patient lying in the bed with no acute distress.  EYES: Pupils equal, round, reactive to light and accommodation. No scleral icterus. Extraocular muscles intact.  HEENT: Head atraumatic, normocephalic. Oropharynx and nasopharynx clear.  NECK:  Supple, no jugular venous distention. No thyroid enlargement, no tenderness.  LUNGS: Normal breath sounds bilaterally, no wheezing, rales,rhonchi or crepitation. No use of accessory muscles of respiration.  CARDIOVASCULAR: S1, S2 normal. No murmurs, rubs, or gallops.  ABDOMEN: Soft, non-tender, non-distended. Bowel sounds present. No organomegaly or mass.  EXTREMITIES: No pedal edema, cyanosis, or clubbing.  NEUROLOGIC: Cranial nerves II through XII are intact. Muscle strength 5/5 in all extremities. Sensation intact. Gait not checked.  PSYCHIATRIC: The patient is alert and oriented x 3.  SKIN: No obvious rash, lesion, or ulcer.  DATA REVIEW:   CBC  Recent Labs Lab 07/12/16 0509  WBC 4.2  HGB 13.6  HCT 40.2  PLT 187    Chemistries   Recent Labs Lab 07/08/16 0525 07/12/16 0509  NA 141  --   K 3.6  --   CL 113*  --   CO2 22  --   GLUCOSE 66  --   BUN 13  --   CREATININE 0.66 0.92  CALCIUM 8.6*  --   AST 17  --   ALT 15  --   ALKPHOS 42  --   BILITOT 0.9  --      Microbiology Results  NEG INFLUENZA   Management plans discussed with the patient, family and they are in agreement.  CODE STATUS: FULL CODE  TOTAL TIME TAKING CARE OF THIS PATIENT: 45 minutes.    Max Sane M.D on 07/13/2016 at 6:11 PM  Between 7am to 6pm - Pager - [phone]  After 6pm go to [email or web address] - Proofreader  [facility]  Office  [phone]  CC: Primary care physician; Coral Spikes, DO   Note: This dictation was prepared with Dragon dictation along with smaller phrase  technology. Any transcriptional errors that result from this process are unintentional.

## 2016-07-15 NOTE — Telephone Encounter (Signed)
HFU scheduled for 07/17/16.

## 2016-07-17 ENCOUNTER — Ambulatory Visit (INDEPENDENT_AMBULATORY_CARE_PROVIDER_SITE_OTHER): Payer: BLUE CROSS/BLUE SHIELD | Admitting: Family Medicine

## 2016-07-17 ENCOUNTER — Encounter: Payer: Self-pay | Admitting: Family Medicine

## 2016-07-17 ENCOUNTER — Other Ambulatory Visit: Payer: Self-pay | Admitting: Family Medicine

## 2016-07-17 DIAGNOSIS — R42 Dizziness and giddiness: Secondary | ICD-10-CM | POA: Diagnosis not present

## 2016-07-17 NOTE — Patient Instructions (Signed)
Use the valium as needed.  Call me regarding return to driving/work.  Take care  Dr. Lacinda Axon

## 2016-07-18 NOTE — Assessment & Plan Note (Signed)
Hospital course reviewed and summarized in history of present illness. She is currently improving. Advised her to continue Valium as needed. Follow-up closely with ENT. May need vestibular rehab - she will discuss with ENT.

## 2016-07-18 NOTE — Progress Notes (Signed)
Subjective:  Patient ID: Sandra Horton, female    DOB: [birth year]  Age: 65 y.o. MRN: [id number]  CC: Hospital follow up  HPI:  66 year old female with hypothyroidism and hyperlipidemia presents for hospital follow-up.  Hospital course reviewed and is summarized as follows: Patient presented on 2/4. She presented with presyncope/near syncope. Patient had cardiac and neurological workup which were negative. Brain imaging was negative as well. ENT was consulted as this was thought to be vertigo versus vestibular neuritis. She underwent physical therapy as well as treatment with medication (meclizine, benzodiazepines). Patient improved during admission and was discharged home on Valium.  Patient resents today for follow-up. She states that she is improving. She has been taking a small amount of Valium intermittently for the past few days. She is currently taking 2.5 mg 1-2 times daily. She is concerned about her use that she does not want this to become an addiction for her. She is doing Epley maneuver at home. She has follow-up with ENT later this week. She has no other complaints or concerns at this time. She is happy with her current progress.  Social Hx   Social History   Social History  . Marital status: Married    Spouse name: N/A  . Number of children: N/A  . Years of education: N/A   Social History Main Topics  . Smoking status: Never Smoker  . Smokeless tobacco: Never Used  . Alcohol use No  . Drug use: No  . Sexual activity: Yes   Other Topics Concern  . None   Social History Narrative  . None    Review of Systems  Constitutional: Negative.   Cardiovascular: Negative.   Neurological: Positive for dizziness.   Objective:  BP 107/70 (BP Location: Left Arm, Patient Position: Sitting, Cuff Size: Normal)   Pulse 80   Temp 97.7 F (36.5 C) (Oral)   Wt 126 lb 12.8 oz (57.5 kg)   SpO2 97%   BMI 21.77 kg/m   BP/Weight 07/17/2016 07/12/2016 [id number]  Systolic BP [id number]  98 [id number]  Diastolic BP 70 66 63  Wt. (Lbs) 126.8 132.9 125.2  BMI 21.77 22.81 22.9   Physical Exam  Constitutional: She is oriented to person, place, and time. She appears well-developed. No distress.  Eyes: Conjunctivae are normal. Pupils are equal, round, and reactive to light.  Cardiovascular: Normal rate and regular rhythm.   Pulmonary/Chest: Effort normal and breath sounds normal.  Neurological: She is alert and oriented to person, place, and time.  Psychiatric: She has a normal mood and affect.  Vitals reviewed.  Lab Results  Component Value Date   WBC 4.2 07/12/2016   HGB 13.6 07/12/2016   HCT 40.2 07/12/2016   PLT 187 07/12/2016   GLUCOSE 66 07/08/2016   CHOL 145 02/22/2016   TRIG 76.0 02/22/2016   HDL 58.90 02/22/2016   LDLCALC 71 02/22/2016   ALT 15 07/08/2016   AST 17 07/08/2016   NA 141 07/08/2016   K 3.6 07/08/2016   CL 113 (H) 07/08/2016   CREATININE 0.92 07/12/2016   BUN 13 07/08/2016   CO2 22 07/08/2016   TSH 0.235 (L) 07/08/2016   Assessment & Plan:   Problem List Items Addressed This Visit    Vertigo    Hospital course reviewed and summarized in history of present illness. She is currently improving. Advised her to continue Valium as needed. Follow-up closely with ENT. May need vestibular rehab - she will discuss with ENT.  Follow-up: PRN  [place]

## 2016-07-22 NOTE — Telephone Encounter (Signed)
-----   Message from Coral Spikes, DO sent at 07/22/2016  8:02 AM EST ----- Please find out when this first started (ie when she first missed work).  JC DO

## 2016-07-22 NOTE — Telephone Encounter (Signed)
Pt was called and asked about FMLA dates. She stated that if she goes off valium for the 5 days which started Saturday. She will only need a return to work with no restrictions note. That ideal date will be Thursday 07/25/16 for return. Her 5 day period will end Wednesday. She will call to confirm that she has been completely off of medication on that day. Once note is written and printed it will need to be faxed to [facility] at [phone].

## 2016-07-22 NOTE — Telephone Encounter (Signed)
Let me know

## 2016-07-24 NOTE — Telephone Encounter (Signed)
Okay to return to work. Please write letter.

## 2016-07-24 NOTE — Telephone Encounter (Signed)
Pt stated that she had been off off medication for a full 5 days please write letter for her to return to work on 07/25/16.

## 2016-07-24 NOTE — Telephone Encounter (Signed)
Letter completed and faxed.

## 2016-08-18 ENCOUNTER — Other Ambulatory Visit: Payer: Self-pay | Admitting: Obstetrics and Gynecology

## 2016-12-30 ENCOUNTER — Other Ambulatory Visit: Payer: Self-pay | Admitting: Family Medicine

## 2016-12-30 DIAGNOSIS — M549 Dorsalgia, unspecified: Principal | ICD-10-CM

## 2016-12-30 DIAGNOSIS — G8929 Other chronic pain: Principal | ICD-10-CM

## 2016-12-30 DIAGNOSIS — M542 Cervicalgia: Secondary | ICD-10-CM

## 2016-12-30 NOTE — Telephone Encounter (Addendum)
Advised patient refills on [facility] at [facility]

## 2016-12-30 NOTE — Telephone Encounter (Signed)
Pt has changed pharmacy's. [facility] is [facility] [place]. She is out of the following medications and needs refills on the following;  meloxicam (MOBIC) 15 MG tablet  levothyroxine (SYNTHROID, LEVOTHROID) 88 MCG tablet  Both need to be a 90 day supply.

## 2016-12-31 NOTE — Telephone Encounter (Signed)
Last office 02/22/16 No office visit

## 2017-01-01 ENCOUNTER — Other Ambulatory Visit: Payer: Self-pay | Admitting: Family Medicine

## 2017-01-01 MED ORDER — MELOXICAM 15 MG PO TABS: 15.0000 mg | ORAL_TABLET | Freq: Every day | ORAL | 0 refills | Status: DC | PRN

## 2017-01-01 MED ORDER — POLYETHYLENE GLYCOL 3350 17 G PO PACK: 17.0000 g | PACK | Freq: Every day | ORAL | 1 refills | Status: DC

## 2017-01-02 ENCOUNTER — Telehealth: Payer: Self-pay | Admitting: Family Medicine

## 2017-01-02 DIAGNOSIS — F3342 Major depressive disorder, recurrent, in full remission: Secondary | ICD-10-CM

## 2017-01-02 NOTE — Telephone Encounter (Signed)
Pt called requesting a refill on her levothyroxine (SYNTHROID, LEVOTHROID) 88 MCG tablet, lovastatin (MEVACOR) 40 MG tablet, omeprazole (PRILOSEC) 20 MG capsule, and PARoxetine (PAXIL) 20 MG tablet. Pt would like a 90 day supply. Please advise, thank you!  Pharmacy - [id number] - [place], [address]

## 2017-01-02 NOTE — Telephone Encounter (Signed)
Last office 02/22/16, to follow up 6 months to 1 year  No office visit schedule , ok to fill 90 day supply

## 2017-01-03 ENCOUNTER — Other Ambulatory Visit: Payer: Self-pay

## 2017-01-03 MED ORDER — VALACYCLOVIR HCL 500 MG PO TABS: 500.0000 mg | ORAL_TABLET | Freq: Every day | ORAL | 1 refills | Status: DC

## 2017-01-03 MED ORDER — LEVOTHYROXINE SODIUM 88 MCG PO TABS: 88.0000 ug | ORAL_TABLET | Freq: Every day | ORAL | 1 refills | Status: DC

## 2017-01-03 MED ORDER — PAROXETINE HCL 20 MG PO TABS: 20.0000 mg | ORAL_TABLET | Freq: Every day | ORAL | 3 refills | Status: DC

## 2017-01-03 NOTE — Telephone Encounter (Signed)
Pt wants a 90 day refill  Valtrex and wants to know if she can take Generic daily because its cheaper? please advise, please send to [facility] [place]

## 2017-01-03 NOTE — Telephone Encounter (Signed)
Script faxed for [facility]othyroxine to [facility]

## 2017-01-06 ENCOUNTER — Telehealth: Payer: Self-pay | Admitting: Family Medicine

## 2017-01-06 NOTE — Telephone Encounter (Signed)
[facility] not [facility] a [facility] for lovastatin (MEVACOR) 40 MG tablet, omeprazole (PRILOSEC) 20 MG capsule. Pt new Rx written for Miralx it needs to specify 527 grain bottle.   Pharmacy is [id number] - [place], [address]  Call pt @ [phone]. Thank you!

## 2017-01-07 ENCOUNTER — Other Ambulatory Visit: Payer: Self-pay | Admitting: Family Medicine

## 2017-01-07 MED ORDER — POLYETHYLENE GLYCOL 3350 17 GM/SCOOP PO POWD: 17.0000 g | Freq: Two times a day (BID) | ORAL | 2 refills | Status: DC | PRN

## 2017-01-07 MED ORDER — OMEPRAZOLE 20 MG PO CPDR: 20.0000 mg | DELAYED_RELEASE_CAPSULE | Freq: Two times a day (BID) | ORAL | 0 refills | Status: DC

## 2017-01-07 MED ORDER — LOVASTATIN 40 MG PO TABS: 40.0000 mg | ORAL_TABLET | Freq: Every day | ORAL | 0 refills | Status: DC

## 2017-01-07 NOTE — Telephone Encounter (Signed)
Scripts sent in for lovastatin and omeprazole .  I am unsure what she is referring to with Miralax 527 gram bottle can you clarify please ? Thanks.

## 2017-01-07 NOTE — Addendum Note (Signed)
Addended by: Johna Sheriff on: 01/07/2017 11:38 AM   Modules accepted: Orders

## 2017-03-21 ENCOUNTER — Other Ambulatory Visit: Payer: Self-pay | Admitting: Family Medicine

## 2017-03-21 ENCOUNTER — Other Ambulatory Visit: Payer: Self-pay | Admitting: Obstetrics and Gynecology

## 2017-03-21 DIAGNOSIS — M549 Dorsalgia, unspecified: Secondary | ICD-10-CM

## 2017-03-21 DIAGNOSIS — F3342 Major depressive disorder, recurrent, in full remission: Secondary | ICD-10-CM

## 2017-03-21 DIAGNOSIS — M542 Cervicalgia: Secondary | ICD-10-CM

## 2017-03-21 DIAGNOSIS — G8929 Other chronic pain: Secondary | ICD-10-CM

## 2017-03-21 NOTE — Telephone Encounter (Signed)
Please advise for refills, thanks 

## 2017-03-22 NOTE — Telephone Encounter (Signed)
One month of refills sent to pharmacy. Patient needs to set up an establish care visit.

## 2017-04-05 ENCOUNTER — Other Ambulatory Visit: Payer: Self-pay | Admitting: Family Medicine

## 2017-04-16 ENCOUNTER — Ambulatory Visit (INDEPENDENT_AMBULATORY_CARE_PROVIDER_SITE_OTHER): Payer: Medicare Other | Admitting: Obstetrics and Gynecology

## 2017-04-16 ENCOUNTER — Encounter: Payer: Self-pay | Admitting: Obstetrics and Gynecology

## 2017-04-16 VITALS — BP 124/78 | Ht 64.0 in | Wt 124.0 lb

## 2017-04-16 DIAGNOSIS — Z01419 Encounter for gynecological examination (general) (routine) without abnormal findings: Secondary | ICD-10-CM

## 2017-04-16 DIAGNOSIS — Z8041 Family history of malignant neoplasm of ovary: Secondary | ICD-10-CM

## 2017-04-16 DIAGNOSIS — Z0000 Encounter for general adult medical examination without abnormal findings: Secondary | ICD-10-CM | POA: Diagnosis not present

## 2017-04-16 DIAGNOSIS — N952 Postmenopausal atrophic vaginitis: Secondary | ICD-10-CM

## 2017-04-16 DIAGNOSIS — Z1239 Encounter for other screening for malignant neoplasm of breast: Secondary | ICD-10-CM

## 2017-04-16 DIAGNOSIS — Z1231 Encounter for screening mammogram for malignant neoplasm of breast: Secondary | ICD-10-CM | POA: Diagnosis not present

## 2017-04-16 MED ORDER — ESTRADIOL 0.1 MG/GM VA CREA: TOPICAL_CREAM | VAGINAL | 1 refills | Status: DC

## 2017-04-16 NOTE — Progress Notes (Signed)
PCP: Coral Spikes, DO   Chief Complaint  Patient presents with  . Annual Exam    HPI:      Sandra Horton is a 66 y.o. [id number] who LMP was No LMP recorded. Patient is postmenopausal., presents today for her annual examination.  Her menses are absent due to menopause. She does not have intermenstrual bleeding. She had thickened EM on u/s last yr with neg EMB/SHGM.   She does not have vasomotor sx. .  Sex activity: single partner, contraception - diaphragm and post menopausal status. She does have vaginal dryness--sx relieved with estrace vag crm.  Last Pap: March 23, 2015  Results were: no abnormalities /neg HPV DNA.  Hx of STDs: HSV  Last mammogram: May 09, 2016  Results were: normal--routine follow-up in 12 months There is no FH of breast cancer. There is a FH of ovarian cancer in her sister. Her sister is BRCA neg and pt is MyRisk neg 2016. The patient does not do self-breast exams. Pt had neg GYN u/s for ovaries and neg ca-125 last yr. She declines screening this yr.  Colonoscopy: with PCP, pt states due this yr. Repeat due after 10 years last time even with FH colon cancer in mat aunt.  DEXA: with PCP  Tobacco use: The patient denies current or previous tobacco use. Alcohol use: none Exercise: not active  She does get adequate calcium and Vitamin D in her diet.  Labs with PCP.   Past Medical History:  Diagnosis Date  . Allergy   . Anxiety   . BRCA negative 2016   MyRisk neg   . Depression   . Family history of ovarian cancer 2016   MyRisk neg; affected sister is BRCA neg  . GERD (gastroesophageal reflux disease)   . Herpes simplex   . High cholesterol   . Hypercholesteremia   . Hyperlipidemia   . Osteoporosis   . Rosacea   . Thyroid disease   . Vertigo     Past Surgical History:  Procedure Laterality Date  . CERVICAL BIOPSY  W/ LOOP ELECTRODE EXCISION    . COLONOSCOPY    . DILATION AND CURETTAGE OF UTERUS  30 years ago    Family History    Problem Relation Age of Onset  . Cancer Mother   . Diabetes Mother   . Thyroid disease Mother   . Hypertension Father   . Stroke Father   . Thyroid disease Sister   . Ovarian cancer Sister 76       BRCA neg  . Diabetes Daughter   . Colon cancer Maternal Aunt 28    Social History   Socioeconomic History  . Marital status: Married    Spouse name: Not on file  . Number of children: Not on file  . Years of education: Not on file  . Highest education level: Not on file  Social Needs  . Financial resource strain: Not on file  . Food insecurity - worry: Not on file  . Food insecurity - inability: Not on file  . Transportation needs - medical: Not on file  . Transportation needs - non-medical: Not on file  Occupational History  . Not on file  Tobacco Use  . Smoking status: Never Smoker  . Smokeless tobacco: Never Used  Substance and Sexual Activity  . Alcohol use: No    Alcohol/week: 0.0 oz  . Drug use: No  . Sexual activity: Yes    Birth control/protection: Post-menopausal  Other  Topics Concern  . Not on file  Social History Narrative  . Not on file    No outpatient medications have been marked as taking for the 04/16/17 encounter (Office Visit) with Ercil Cassis, Deirdre Evener, PA-C.      ROS:  Review of Systems  Constitutional: Negative for fatigue, fever and unexpected weight change.  Respiratory: Negative for cough, shortness of breath and wheezing.   Cardiovascular: Negative for chest pain, palpitations and leg swelling.  Gastrointestinal: Negative for blood in stool, constipation, diarrhea, nausea and vomiting.  Endocrine: Negative for cold intolerance, heat intolerance and polyuria.  Genitourinary: Negative for dyspareunia, dysuria, flank pain, frequency, genital sores, hematuria, menstrual problem, pelvic pain, urgency, vaginal bleeding, vaginal discharge and vaginal pain.  Musculoskeletal: Negative for back pain, joint swelling and myalgias.  Skin: Negative for  rash.  Neurological: Positive for dizziness. Negative for syncope, light-headedness, numbness and headaches.  Hematological: Negative for adenopathy.  Psychiatric/Behavioral: Negative for agitation, confusion, sleep disturbance and suicidal ideas. The patient is not nervous/anxious.      Objective: BP 124/78   Ht _0  (1.626 m)   Wt 124 lb (56.2 kg)   BMI 21.28 kg/m    Physical Exam  Constitutional: She is oriented to person, place, and time. She appears well-developed and well-nourished.  Genitourinary: Vagina normal and uterus normal. There is no rash or tenderness on the right labia. There is no rash or tenderness on the left labia. No erythema or tenderness in the vagina. No vaginal discharge found. Right adnexum does not display mass and does not display tenderness. Left adnexum does not display mass and does not display tenderness. Cervix does not exhibit motion tenderness or polyp. Uterus is not enlarged or tender.  Neck: Normal range of motion. No thyromegaly present.  Cardiovascular: Normal rate, regular rhythm and normal heart sounds.  No murmur heard. Pulmonary/Chest: Effort normal and breath sounds normal. Right breast exhibits no mass, no nipple discharge, no skin change and no tenderness. Left breast exhibits no mass, no nipple discharge, no skin change and no tenderness.  Abdominal: Soft. There is no tenderness. There is no guarding.  Musculoskeletal: Normal range of motion.  Neurological: She is alert and oriented to person, place, and time. No cranial nerve deficit.  Psychiatric: She has a normal mood and affect. Her behavior is normal.  Vitals reviewed.   Assessment/Plan:  Encounter for annual routine gynecological examination  Screening for breast cancer - Pt to sched mammo. - Plan: MM DIGITAL SCREENING BILATERAL  Postmenopausal atrophic vaginitis - Sx improved wtih estrace crm. Rx RF. Coupon card.  - Plan: estradiol (ESTRACE VAGINAL) 0.1 MG/GM vaginal  cream  Family history of ovarian cancer - Pt is MyRisk neg. Sister had ovar cancer. Pt aware no great screening options and declines ca-125 and GYN u/s this yr. F/u prn.    Meds ordered this encounter  Medications  . estradiol (ESTRACE VAGINAL) 0.1 MG/GM vaginal cream    Sig: APPLY 1 GRAM BY VAGINAL ROUTE ONCE WEEKLY    Dispense:  42.5 g    Refill:  1            GYN counsel mammography screening, menopause, adequate intake of calcium and vitamin D, diet and exercise    F/U  Return in about 1 year (around 04/16/2018).  Davell Beckstead B. Tyeisha Dinan, PA-C 04/16/2017 2:20 PM

## 2017-04-16 NOTE — Patient Instructions (Signed)
I value your feedback and entrusting us with your care. If you get a [facility] patient survey, I would appreciate you taking the time to let us know about your experience today. Thank you! 

## 2017-04-21 ENCOUNTER — Encounter: Payer: Self-pay | Admitting: Internal Medicine

## 2017-04-21 ENCOUNTER — Ambulatory Visit (INDEPENDENT_AMBULATORY_CARE_PROVIDER_SITE_OTHER): Payer: Medicare Other | Admitting: Internal Medicine

## 2017-04-21 VITALS — BP 108/64 | HR 70 | Temp 98.0°F | Ht 63.0 in | Wt 124.6 lb

## 2017-04-21 DIAGNOSIS — E559 Vitamin D deficiency, unspecified: Secondary | ICD-10-CM

## 2017-04-21 DIAGNOSIS — M549 Dorsalgia, unspecified: Secondary | ICD-10-CM

## 2017-04-21 DIAGNOSIS — Z23 Encounter for immunization: Secondary | ICD-10-CM

## 2017-04-21 DIAGNOSIS — Z1159 Encounter for screening for other viral diseases: Secondary | ICD-10-CM | POA: Diagnosis not present

## 2017-04-21 DIAGNOSIS — F3342 Major depressive disorder, recurrent, in full remission: Secondary | ICD-10-CM | POA: Diagnosis not present

## 2017-04-21 DIAGNOSIS — E039 Hypothyroidism, unspecified: Secondary | ICD-10-CM | POA: Diagnosis not present

## 2017-04-21 DIAGNOSIS — M8580 Other specified disorders of bone density and structure, unspecified site: Secondary | ICD-10-CM

## 2017-04-21 DIAGNOSIS — G8929 Other chronic pain: Secondary | ICD-10-CM

## 2017-04-21 DIAGNOSIS — M542 Cervicalgia: Secondary | ICD-10-CM

## 2017-04-21 DIAGNOSIS — Z1231 Encounter for screening mammogram for malignant neoplasm of breast: Secondary | ICD-10-CM

## 2017-04-21 LAB — CBC WITH DIFFERENTIAL/PLATELET
BASOS PCT: 0.8 % (ref 0.0–3.0)
Basophils Absolute: 0 10*3/uL (ref 0.0–0.1)
EOS ABS: 0.1 10*3/uL (ref 0.0–0.7)
Eosinophils Relative: 1.5 % (ref 0.0–5.0)
HCT: 41.4 % (ref 36.0–46.0)
Hemoglobin: 14.1 g/dL (ref 12.0–15.0)
Lymphocytes Relative: 29.4 % (ref 12.0–46.0)
Lymphs Abs: 1.2 10*3/uL (ref 0.7–4.0)
MCHC: 34.1 g/dL (ref 30.0–36.0)
MCV: 97.6 fl (ref 78.0–100.0)
Monocytes Absolute: 0.3 10*3/uL (ref 0.1–1.0)
Monocytes Relative: 7.9 % (ref 3.0–12.0)
Neutro Abs: 2.5 10*3/uL (ref 1.4–7.7)
Neutrophils Relative %: 60.4 % (ref 43.0–77.0)
PLATELETS: 228 10*3/uL (ref 150.0–400.0)
RBC: 4.24 Mil/uL (ref 3.87–5.11)
RDW: 12.3 % (ref 11.5–15.5)
WBC: 4.2 10*3/uL (ref 4.0–10.5)

## 2017-04-21 LAB — COMPREHENSIVE METABOLIC PANEL
ALT: 18 U/L (ref 0–35)
AST: 19 U/L (ref 0–37)
Albumin: 4.4 g/dL (ref 3.5–5.2)
Alkaline Phosphatase: 49 U/L (ref 39–117)
BUN: 15 mg/dL (ref 6–23)
CHLORIDE: 101 meq/L (ref 96–112)
CO2: 29 mEq/L (ref 19–32)
CREATININE: 0.87 mg/dL (ref 0.40–1.20)
Calcium: 9.7 mg/dL (ref 8.4–10.5)
GFR: 68.89 mL/min (ref >60.00)
Glucose, Bld: 82 mg/dL (ref 70–99)
Potassium: 4.2 mEq/L (ref 3.5–5.1)
SODIUM: 136 meq/L (ref 135–145)
Total Bilirubin: 0.6 mg/dL (ref 0.2–1.2)
Total Protein: 7.2 g/dL (ref 6.0–8.3)

## 2017-04-21 LAB — TSH: TSH: 0.64 u[IU]/mL (ref 0.35–4.50)

## 2017-04-21 LAB — VITAMIN D 25 HYDROXY (VIT D DEFICIENCY, FRACTURES): VITD: 41.35 ng/mL (ref 30.00–100.00)

## 2017-04-21 MED ORDER — LOVASTATIN 40 MG PO TABS: 40.0000 mg | ORAL_TABLET | Freq: Every day | ORAL | 1 refills | Status: DC

## 2017-04-21 MED ORDER — OMEPRAZOLE 20 MG PO CPDR: 20.0000 mg | DELAYED_RELEASE_CAPSULE | Freq: Two times a day (BID) | ORAL | 1 refills | Status: DC

## 2017-04-21 MED ORDER — PAROXETINE HCL 20 MG PO TABS: 20.0000 mg | ORAL_TABLET | Freq: Every day | ORAL | 5 refills | Status: DC

## 2017-04-21 MED ORDER — LEVOTHYROXINE SODIUM 88 MCG PO TABS: 88.0000 ug | ORAL_TABLET | Freq: Every day | ORAL | 1 refills | Status: DC

## 2017-04-21 MED ORDER — MELOXICAM 15 MG PO TABS: 15.0000 mg | ORAL_TABLET | Freq: Every day | ORAL | 0 refills | Status: DC | PRN

## 2017-04-21 NOTE — Progress Notes (Signed)
Chief Complaint  Patient presents with  . Follow-up   Pt presents for f/u and need for medication refills  1. Reviewed chart and need for pna 23 and prevnar vx does not appear that she had this with former PCP Dr. Rutherford Nail  2. She reports h/o osteopenia and not osteoporosis though reports at one time she was on Fosamax. She stopped taking Calcium when serum level was elevated and has not been on vit D 1000 iu but agreeable to resume  3. H/o hypothyroidism synthyroid was reduced from 100 to 88 mcg in past as TSH 07/08/16 was low    Review of Systems  Constitutional: Negative for weight loss.  Respiratory: Negative for shortness of breath.   Cardiovascular: Negative for chest pain.  Gastrointestinal: Negative for abdominal pain.   Past Medical History:  Diagnosis Date  . Allergy   . Anxiety   . BRCA negative 2016   MyRisk neg   . Depression   . Family history of ovarian cancer 2016   MyRisk neg; affected sister is BRCA neg  . GERD (gastroesophageal reflux disease)   . Herpes simplex   . High cholesterol   . Hypercholesteremia   . Hyperlipidemia   . Osteoporosis   . Rosacea   . Thyroid disease   . Vertigo    Past Surgical History:  Procedure Laterality Date  . CERVICAL BIOPSY  W/ LOOP ELECTRODE EXCISION    . COLONOSCOPY    . DILATION AND CURETTAGE OF UTERUS  30 years ago   Family History  Problem Relation Age of Onset  . Cancer Mother   . Diabetes Mother   . Thyroid disease Mother   . Hypertension Father   . Stroke Father   . Thyroid disease Sister   . Ovarian cancer Sister 37       BRCA neg  . Diabetes Daughter   . Colon cancer Maternal Aunt 2   Social History   Socioeconomic History  . Marital status: Married    Spouse name: Not on file  . Number of children: Not on file  . Years of education: Not on file  . Highest education level: Not on file  Social Needs  . Financial resource strain: Not on file  . Food insecurity - worry: Not on file  . Food  insecurity - inability: Not on file  . Transportation needs - medical: Not on file  . Transportation needs - non-medical: Not on file  Occupational History  . Not on file  Tobacco Use  . Smoking status: Never Smoker  . Smokeless tobacco: Never Used  Substance and Sexual Activity  . Alcohol use: No    Alcohol/week: 0.0 oz  . Drug use: No  . Sexual activity: Yes    Birth control/protection: Post-menopausal  Other Topics Concern  . Not on file  Social History Narrative  . Not on file   Current Meds  Medication Sig  . aspirin 81 MG tablet Take 81 mg by mouth daily.  Marland Kitchen BIOTIN PO Take 1 tablet by mouth daily.  . cetirizine (ZYRTEC) 10 MG tablet Take 10 mg by mouth daily.  Marland Kitchen docusate sodium (COLACE) 100 MG capsule Take 100 mg by mouth 2 (two) times daily as needed for mild constipation.  Marland Kitchen estradiol (ESTRACE VAGINAL) 0.1 MG/GM vaginal cream APPLY 1 GRAM BY VAGINAL ROUTE ONCE WEEKLY  . fluticasone (FLONASE) 50 MCG/ACT nasal spray Place 2 sprays into both nostrils daily.  Marland Kitchen levothyroxine (SYNTHROID, LEVOTHROID) 88 MCG tablet Take 1  tablet (88 mcg total) daily before breakfast by mouth. Empty stomach  . lovastatin (MEVACOR) 40 MG tablet Take 1 tablet (40 mg total) at bedtime by mouth.  . magnesium chloride (SLOW-MAG) 64 MG TBEC SR tablet Take 1 tablet by mouth.  . meloxicam (MOBIC) 15 MG tablet Take 1 tablet (15 mg total) daily as needed by mouth for pain.  Marland Kitchen omeprazole (PRILOSEC) 20 MG capsule TAKE 1 CAPSULE (20 MG TOTAL) BY MOUTH 2 (TWO) TIMES DAILY.  Marland Kitchen PARoxetine (PAXIL) 20 MG tablet Take 1 tablet (20 mg total) daily by mouth.  . polyethylene glycol powder (GLYCOLAX/MIRALAX) powder TAKE 17 G BY MOUTH 2 (TWO) TIMES DAILY AS NEEDED.  Marland Kitchen valACYclovir (VALTREX) 500 MG tablet TAKE 1 TABLET BY MOUTH EVERY DAY  . vitamin B-12 (CYANOCOBALAMIN) 500 MCG tablet Take 500 mcg by mouth daily.  . [DISCONTINUED] levothyroxine (SYNTHROID, LEVOTHROID) 88 MCG tablet TAKE 1 TABLET BY MOUTH DAILY BEFORE  BREAKFAST  . [DISCONTINUED] lovastatin (MEVACOR) 40 MG tablet TAKE 1 TABLET BY MOUTH EVERY DAY  . [DISCONTINUED] meloxicam (MOBIC) 15 MG tablet TAKE 1 TABLET (15 MG TOTAL) BY MOUTH DAILY AS NEEDED FOR PAIN.  . [DISCONTINUED] PARoxetine (PAXIL) 20 MG tablet TAKE 1 TABLET BY MOUTH EVERY DAY  . [DISCONTINUED] PARoxetine (PAXIL) 20 MG tablet Take 1 tablet (20 mg total) daily by mouth.   Allergies  Allergen Reactions  . Penicillin V Potassium Anaphylaxis  . Penicillins Anaphylaxis  . Tetanus Toxoid Nausea And Vomiting   There were no vitals filed for this visit. No results found for this or any previous visit (from the past 2160 hour(s)). Objective  Physical Exam  Constitutional: She is oriented to person, place, and time and well-developed, well-nourished, and in no distress. Vital signs are normal.  HENT:  Head: Normocephalic and atraumatic.  Mouth/Throat: Oropharynx is clear and moist and mucous membranes are normal.  Eyes: Conjunctivae are normal. Pupils are equal, round, and reactive to light.  Cardiovascular: Normal rate, regular rhythm and normal heart sounds.  Neg leg edema b/l   Pulmonary/Chest: Effort normal and breath sounds normal.  Abdominal: Soft. Bowel sounds are normal.  Neurological: She is alert and oriented to person, place, and time. She has normal motor skills.  Skin: Skin is warm, dry and intact.  Psychiatric: Mood, memory, affect and judgment normal.  Pleasant   Nursing note and vitals reviewed.  Assessment   1. Hypothyroidism  2. H/o osteopenia and vit D def  3. HLD  4. GERD 5. Depression controlled  6. HM   Plan  1. Check labs today CMET, CBC, TSH,  UA (already had 07/07/16), vit D, vit B/C declines HIV  Cont meds  2. revewed DEXA 09/02/13 osteopenia. rec pt do wt bearing exercising and resume vit D 1000 IU qd, will check Ca serum and hold on oral Ca for now 600 mg bid as pt has h/o hypercalcemia with oral Ca intake  Check vit D level 3.cont meds. Hold  lipid check for now not fasting Check lipid in future  4. Cont meds  5. Cont meds  6.  Had flu shot Given prevnar today will need pna 23 in 1 year  Allergic to Tdap  Never had hep B vaccines Had zostavax but disc shingrix today to consider   Referred for mammo though appears order already in due 05/09/17   Last pap [facility] and due again 2019 will f/u   Need to get colonoscopy record from when he was at [facility] and due  for colonoscopy again 2019 pt wants to see Dr. Allen Norris.   DEXA had 09/02/13 osteopenia consider repeat after 3 years will disc at f/u advised take vit D 1000 IU and hold ca for now see above.   Declines HIV check will do hep B/C today   Dermatology appt due 10/2017 [facility].  Never smoker, no etoh, drugs    Provider: Dr. Olivia Mackie McLean-Scocuzza

## 2017-04-21 NOTE — Patient Instructions (Addendum)
1. Labs today  2. Please consider shingrix vaccine for shingles, prevnar and pneumonia 23 vaccines  3. We would like records from Dr. Allen Norris from your previous colonoscopy  4. Please do weight bearing exercise and resume vitamin D 1000 IU daily hold on Calcium for now with h/o increased calcium with calcium use  5. F/u in 4 months  6. We will refer for mammogram today to [place]   Pneumococcal Conjugate Vaccine suspension for injection What is this medicine? PNEUMOCOCCAL VACCINE (NEU mo KOK al vak SEEN) is a vaccine used to prevent pneumococcus bacterial infections. These bacteria can cause serious infections like pneumonia, meningitis, and blood infections. This vaccine will lower your chance of getting pneumonia. If you do get pneumonia, it can make your symptoms milder and your illness shorter. This vaccine will not treat an infection and will not cause infection. This vaccine is recommended for infants and young children, adults with certain medical conditions, and adults 85 years or older. This medicine may be used for other purposes; ask your health care provider or pharmacist if you have questions. COMMON BRAND NAME(S): Prevnar, Prevnar 13 What should I tell my health care provider before I take this medicine? They need to know if you have any of these conditions: -bleeding problems -fever -immune system problems -an unusual or allergic reaction to pneumococcal vaccine, diphtheria toxoid, other vaccines, latex, other medicines, foods, dyes, or preservatives -pregnant or trying to get pregnant -breast-feeding How should I use this medicine? This vaccine is for injection into a muscle. It is given by a health care professional. A copy of Vaccine Information Statements will be given before each vaccination. Read this sheet carefully each time. The sheet may change frequently. Talk to your pediatrician regarding the use of this medicine in children. While this drug may be prescribed for  children as young as 40 weeks old for selected conditions, precautions do apply. Overdosage: If you think you have taken too much of this medicine contact a poison control center or emergency room at once. NOTE: This medicine is only for you. Do not share this medicine with others. What if I miss a dose? It is important not to miss your dose. Call your doctor or health care professional if you are unable to keep an appointment. What may interact with this medicine? -medicines for cancer chemotherapy -medicines that suppress your immune function -steroid medicines like prednisone or cortisone This list may not describe all possible interactions. Give your health care provider a list of all the medicines, herbs, non-prescription drugs, or dietary supplements you use. Also tell them if you smoke, drink alcohol, or use illegal drugs. Some items may interact with your medicine. What should I watch for while using this medicine? Mild fever and pain should go away in 3 days or less. Report any unusual symptoms to your doctor or health care professional. What side effects may I notice from receiving this medicine? Side effects that you should report to your doctor or health care professional as soon as possible: -allergic reactions like skin rash, itching or hives, swelling of the face, lips, or tongue -breathing problems -confused -fast or irregular heartbeat -fever over 102 degrees F -seizures -unusual bleeding or bruising -unusual muscle weakness Side effects that usually do not require medical attention (report to your doctor or health care professional if they continue or are bothersome): -aches and pains -diarrhea -fever of 102 degrees F or less -headache -irritable -loss of appetite -pain, tender at site where injected -trouble  sleeping This list may not describe all possible side effects. Call your doctor for medical advice about side effects. You may report side effects to FDA at  1-800-FDA-1088. Where should I keep my medicine? This does not apply. This vaccine is given in a clinic, pharmacy, doctor's office, or other health care setting and will not be stored at home. NOTE: This sheet is a summary. It may not cover all possible information. If you have questions about this medicine, talk to your doctor, pharmacist, or health care provider.  2018 Elsevier/Gold Standard (2014-02-24 10:27:27)

## 2017-04-22 LAB — HEPATITIS B SURFACE ANTIGEN: Hepatitis B Surface Ag: NONREACTIVE

## 2017-04-22 LAB — HEPATITIS B CORE ANTIBODY, TOTAL: HEP B C TOTAL AB: NONREACTIVE

## 2017-04-22 LAB — HEPATITIS B SURFACE ANTIBODY, QUANTITATIVE

## 2017-04-22 LAB — HEPATITIS C ANTIBODY
Hepatitis C Ab: NONREACTIVE
SIGNAL TO CUT-OFF: 0.01 (ref <1.00)

## 2017-05-01 ENCOUNTER — Ambulatory Visit (INDEPENDENT_AMBULATORY_CARE_PROVIDER_SITE_OTHER): Payer: Medicare Other | Admitting: *Deleted

## 2017-05-01 DIAGNOSIS — Z23 Encounter for immunization: Secondary | ICD-10-CM

## 2017-05-23 ENCOUNTER — Ambulatory Visit
Admission: RE | Admit: 2017-05-23 | Discharge: 2017-05-23 | Disposition: A | Discharge status: Home / Self Care | Payer: Medicare Other | Source: Ambulatory Visit | Attending: Internal Medicine | Admitting: Internal Medicine

## 2017-05-23 DIAGNOSIS — Z1231 Encounter for screening mammogram for malignant neoplasm of breast: Secondary | ICD-10-CM | POA: Diagnosis not present

## 2017-05-29 ENCOUNTER — Ambulatory Visit (INDEPENDENT_AMBULATORY_CARE_PROVIDER_SITE_OTHER): Payer: Medicare Other | Admitting: *Deleted

## 2017-05-29 DIAGNOSIS — Z23 Encounter for immunization: Secondary | ICD-10-CM

## 2017-07-08 ENCOUNTER — Other Ambulatory Visit: Payer: Self-pay | Admitting: Obstetrics and Gynecology

## 2017-07-08 ENCOUNTER — Other Ambulatory Visit: Payer: Self-pay | Admitting: Family Medicine

## 2017-07-10 ENCOUNTER — Other Ambulatory Visit: Payer: Self-pay | Admitting: Internal Medicine

## 2017-07-10 ENCOUNTER — Other Ambulatory Visit: Payer: Self-pay | Admitting: Family Medicine

## 2017-07-10 DIAGNOSIS — G8929 Other chronic pain: Principal | ICD-10-CM

## 2017-07-10 DIAGNOSIS — M549 Dorsalgia, unspecified: Principal | ICD-10-CM

## 2017-07-10 DIAGNOSIS — M542 Cervicalgia: Secondary | ICD-10-CM

## 2017-07-11 ENCOUNTER — Other Ambulatory Visit: Payer: Self-pay | Admitting: Family Medicine

## 2017-07-11 MED ORDER — MELOXICAM 15 MG PO TABS: 15.0000 mg | ORAL_TABLET | Freq: Every day | ORAL | 0 refills | Status: DC | PRN

## 2017-07-11 NOTE — Telephone Encounter (Signed)
Last OV 04/21/17 last filled 03/22/17 527 g 0rf

## 2017-07-12 ENCOUNTER — Other Ambulatory Visit: Payer: Self-pay | Admitting: Family Medicine

## 2017-07-12 MED ORDER — POLYETHYLENE GLYCOL 3350 17 GM/SCOOP PO POWD: 17.0000 g | Freq: Every day | ORAL | 11 refills | Status: DC | PRN

## 2017-07-15 ENCOUNTER — Other Ambulatory Visit: Payer: Self-pay | Admitting: Family Medicine

## 2017-07-16 ENCOUNTER — Other Ambulatory Visit: Payer: Self-pay | Admitting: Family Medicine

## 2017-07-17 NOTE — Telephone Encounter (Signed)
Please advise 

## 2017-07-17 NOTE — Telephone Encounter (Signed)
Last OV 04/21/17 last filled 07/12/17 850 g

## 2017-07-17 NOTE — Telephone Encounter (Signed)
Pt calling in stating that [facility] still hasnt received the medication. I see it was called in on 07/12/17 and they pharmacy is stating they still do not have it.

## 2017-08-19 ENCOUNTER — Ambulatory Visit: Payer: Medicare Other | Admitting: Internal Medicine

## 2017-09-08 ENCOUNTER — Ambulatory Visit (INDEPENDENT_AMBULATORY_CARE_PROVIDER_SITE_OTHER): Payer: Medicare Other | Admitting: Internal Medicine

## 2017-09-08 ENCOUNTER — Encounter: Payer: Self-pay | Admitting: Internal Medicine

## 2017-09-08 VITALS — BP 104/58 | HR 67 | Temp 98.1°F | Ht 63.0 in | Wt 126.8 lb

## 2017-09-08 DIAGNOSIS — M8580 Other specified disorders of bone density and structure, unspecified site: Secondary | ICD-10-CM

## 2017-09-08 DIAGNOSIS — Z13220 Encounter for screening for lipoid disorders: Secondary | ICD-10-CM | POA: Diagnosis not present

## 2017-09-08 DIAGNOSIS — Z1389 Encounter for screening for other disorder: Secondary | ICD-10-CM

## 2017-09-08 DIAGNOSIS — G8929 Other chronic pain: Secondary | ICD-10-CM | POA: Diagnosis not present

## 2017-09-08 DIAGNOSIS — M549 Dorsalgia, unspecified: Secondary | ICD-10-CM

## 2017-09-08 DIAGNOSIS — Z1211 Encounter for screening for malignant neoplasm of colon: Secondary | ICD-10-CM | POA: Diagnosis not present

## 2017-09-08 DIAGNOSIS — M542 Cervicalgia: Secondary | ICD-10-CM

## 2017-09-08 DIAGNOSIS — E2839 Other primary ovarian failure: Secondary | ICD-10-CM | POA: Diagnosis not present

## 2017-09-08 DIAGNOSIS — Z1329 Encounter for screening for other suspected endocrine disorder: Secondary | ICD-10-CM

## 2017-09-08 MED ORDER — MELOXICAM 15 MG PO TABS: 15.0000 mg | ORAL_TABLET | Freq: Every day | ORAL | 1 refills | Status: DC | PRN

## 2017-09-08 MED ORDER — LEVOTHYROXINE SODIUM 88 MCG PO TABS: 88.0000 ug | ORAL_TABLET | Freq: Every day | ORAL | 1 refills | Status: DC

## 2017-09-08 MED ORDER — LOVASTATIN 40 MG PO TABS: 40.0000 mg | ORAL_TABLET | Freq: Every day | ORAL | 1 refills | Status: DC

## 2017-09-08 NOTE — Patient Instructions (Signed)
Stop paroxetine and let me know if something changes  Fasting labs 10/29/17  F/u in 6 months sooner if needed  I will refer to Dr. Allen Norris for the fall  Schedule bone density as well

## 2017-09-08 NOTE — Progress Notes (Addendum)
Chief Complaint  Patient presents with  . Follow-up   F/u doing well  1. Chronic neck pain takes Mobic and needs refill  2. Hypothyroidism controlled needs refill of synthryoid 88  3. Mood controlled on paxil 1/2 20 mg qd   Review of Systems  Constitutional: Negative for weight loss.  HENT: Negative for hearing loss.   Eyes: Negative for blurred vision.  Respiratory: Negative.  Negative for shortness of breath.   Cardiovascular: Negative for chest pain.  Gastrointestinal: Negative for abdominal pain.  Musculoskeletal: Negative for falls.  Skin: Negative for rash.  Neurological: Negative for headaches.  Psychiatric/Behavioral: Negative for memory loss.   Past Medical History:  Diagnosis Date  . Allergy   . Anxiety   . Arthritis   . BRCA negative 2016   MyRisk neg   . Depression   . Family history of ovarian cancer 2016   MyRisk neg; affected sister is BRCA neg  . GERD (gastroesophageal reflux disease)   . Herpes simplex   . High cholesterol   . Hypercholesteremia   . Hyperlipidemia   . Hypothyroidism   . Osteopenia    DEXA 09/02/13   . Rosacea   . Thyroid disease   . Vertigo    per pt cervicogenic Physical therapy helped   . Vitamin D deficiency    Past Surgical History:  Procedure Laterality Date  . CERVICAL BIOPSY  W/ LOOP ELECTRODE EXCISION    . COLONOSCOPY    . DILATION AND CURETTAGE OF UTERUS  30 years ago  . EYE SURGERY     b/l cataract    Family History  Problem Relation Age of Onset  . Cancer Mother        kidney met to lungs   . Diabetes Mother   . Thyroid disease Mother   . Hypertension Father   . Stroke Father   . Thyroid disease Sister   . Ovarian cancer Sister 43       BRCA neg  . Diabetes Daughter        type 1   . Colon cancer Maternal Aunt 32  . Heart disease Other   . Breast cancer Neg Hx    Social History   Socioeconomic History  . Marital status: Married    Spouse name: Not on file  . Number of children: Not on file  . Years  of education: Not on file  . Highest education level: Not on file  Occupational History  . Not on file  Social Needs  . Financial resource strain: Not on file  . Food insecurity:    Worry: Not on file    Inability: Not on file  . Transportation needs:    Medical: Not on file    Non-medical: Not on file  Tobacco Use  . Smoking status: Never Smoker  . Smokeless tobacco: Never Used  Substance and Sexual Activity  . Alcohol use: No    Alcohol/week: 0.0 oz  . Drug use: No  . Sexual activity: Yes    Birth control/protection: Post-menopausal  Lifestyle  . Physical activity:    Days per week: Not on file    Minutes per session: Not on file  . Stress: Not on file  Relationships  . Social connections:    Talks on phone: Not on file    Gets together: Not on file    Attends religious service: Not on file    Active member of club or organization: Not on file  Attends meetings of clubs or organizations: Not on file    Relationship status: Not on file  . Intimate partner violence:    Fear of current or ex partner: Not on file    Emotionally abused: Not on file    Physically abused: Not on file    Forced sexual activity: Not on file  Other Topics Concern  . Not on file  Social History Narrative   Works part time for [employer]    Current Meds  Medication Sig  . aspirin 81 MG tablet Take 81 mg by mouth daily.  Marland Kitchen BIOTIN PO Take 1 tablet by mouth daily.  . cetirizine (ZYRTEC) 10 MG tablet Take 10 mg by mouth daily.  . Cholecalciferol (VITAMIN D3) 10000 units TABS Take by mouth.  . docusate sodium (COLACE) 100 MG capsule Take 100 mg by mouth 2 (two) times daily as needed for mild constipation.  Marland Kitchen estradiol (ESTRACE VAGINAL) 0.1 MG/GM vaginal cream APPLY 1 GRAM BY VAGINAL ROUTE ONCE WEEKLY  . levothyroxine (SYNTHROID, LEVOTHROID) 88 MCG tablet Take 1 tablet (88 mcg total) by mouth daily before breakfast. Empty stomach  . lovastatin (MEVACOR) 40 MG tablet Take 1 tablet (40 mg  total) by mouth at bedtime.  . magnesium chloride (SLOW-MAG) 64 MG TBEC SR tablet Take 1 tablet by mouth.  . meloxicam (MOBIC) 15 MG tablet Take 1 tablet (15 mg total) by mouth daily as needed for pain.  Marland Kitchen omeprazole (PRILOSEC) 20 MG capsule Take 1 capsule (20 mg total) 2 (two) times daily by mouth. Wait 3 hours after thyroid med  . PARoxetine (PAXIL) 20 MG tablet Take 1 tablet (20 mg total) daily by mouth.  . polyethylene glycol powder (GLYCOLAX/MIRALAX) powder Take 17 g by mouth daily as needed.  . polyethylene glycol powder (GLYCOLAX/MIRALAX) powder 17 grams qd to bid prn  . valACYclovir (VALTREX) 500 MG tablet TAKE 1 TABLET BY MOUTH EVERY DAY  . vitamin B-12 (CYANOCOBALAMIN) 500 MCG tablet Take 500 mcg by mouth daily.  . [DISCONTINUED] levothyroxine (SYNTHROID, LEVOTHROID) 88 MCG tablet Take 1 tablet (88 mcg total) daily before breakfast by mouth. Empty stomach  . [DISCONTINUED] lovastatin (MEVACOR) 40 MG tablet Take 1 tablet (40 mg total) at bedtime by mouth.  . [DISCONTINUED] meloxicam (MOBIC) 15 MG tablet Take 1 tablet (15 mg total) by mouth daily as needed for pain.   Allergies  Allergen Reactions  . Penicillin V Potassium Anaphylaxis  . Penicillins Anaphylaxis  . Tetanus Toxoid Nausea And Vomiting   No results found for this or any previous visit (from the past 2160 hour(s)). Objective  Body mass index is 22.46 kg/m. Wt Readings from Last 3 Encounters:  09/08/17 126 lb 12.8 oz (57.5 kg)  04/21/17 124 lb 9.6 oz (56.5 kg)  04/16/17 124 lb (56.2 kg)   Temp Readings from Last 3 Encounters:  09/08/17 98.1 F (36.7 C) (Oral)  04/21/17 98 F (36.7 C)  07/17/16 97.7 F (36.5 C) (Oral)   BP Readings from Last 3 Encounters:  09/08/17 (!) 104/58  04/21/17 108/64  04/16/17 124/78   Pulse Readings from Last 3 Encounters:  09/08/17 67  04/21/17 70  07/17/16 80    Physical Exam  Constitutional: She is oriented to person, place, and time. Vital signs are normal. She appears  well-developed and well-nourished.  HENT:  Head: Normocephalic and atraumatic.  Mouth/Throat: Oropharynx is clear and moist and mucous membranes are normal.  Eyes: Pupils are equal, round, and reactive to light. Conjunctivae are  normal.  Cardiovascular: Normal rate, regular rhythm and normal heart sounds.  Pulmonary/Chest: Effort normal and breath sounds normal.  Neurological: She is alert and oriented to person, place, and time. Gait normal.  Skin: Skin is warm, dry and intact.  Psychiatric: She has a normal mood and affect. Her speech is normal and behavior is normal. Judgment and thought content normal. Cognition and memory are normal.  Nursing note and vitals reviewed.   Assessment   1. Chronic neck pain  2. Hypothyroidism  3. HM 4. Depression improved  Plan  1.  Refilled mobic  2. Refilled synthryoid 88 mcg  3.  Had flu shot Given prevnar today will need pna 23 in 1 year  Allergic to Tdap  Pending 3/3 hep B vaccine 10/29/17  Had zostavax but disc shingrix not given shingrix vaccine today  mammo 05/23/17 neg   Last pap [facility] 2017 will get records at f/u of note had genetic testing ovarian cancer with OB/GYN neg  -pap 03/23/15 neg pap HPV neg Dr. Laurey Morale   colonoscopy referred Dr. Allen Norris pt wants it to be with him in fall 2019   DEXA had 09/02/13 osteopenia repeat DEXA ordered on vit D3 1000 mg qd .   Dermatology appt due 10/2017 [facility].  Never smoker, no etoh, drugs   4. Stop paxil 10 mg qd     Provider: Dr. Olivia Mackie McLean-Scocuzza-Internal Medicine

## 2017-09-08 NOTE — Progress Notes (Signed)
Pre visit review using our clinic review tool, if applicable. No additional management support is needed unless otherwise documented below in the visit note. 

## 2017-09-27 ENCOUNTER — Encounter: Payer: Self-pay | Admitting: Internal Medicine

## 2017-10-03 ENCOUNTER — Other Ambulatory Visit: Payer: Self-pay | Admitting: Obstetrics and Gynecology

## 2017-10-07 ENCOUNTER — Other Ambulatory Visit: Payer: Self-pay | Admitting: Family Medicine

## 2017-10-07 DIAGNOSIS — F3342 Major depressive disorder, recurrent, in full remission: Secondary | ICD-10-CM

## 2017-10-29 ENCOUNTER — Ambulatory Visit (INDEPENDENT_AMBULATORY_CARE_PROVIDER_SITE_OTHER): Payer: Medicare Other | Admitting: *Deleted

## 2017-10-29 ENCOUNTER — Other Ambulatory Visit (INDEPENDENT_AMBULATORY_CARE_PROVIDER_SITE_OTHER): Payer: Medicare Other

## 2017-10-29 DIAGNOSIS — Z1329 Encounter for screening for other suspected endocrine disorder: Secondary | ICD-10-CM | POA: Diagnosis not present

## 2017-10-29 DIAGNOSIS — Z23 Encounter for immunization: Secondary | ICD-10-CM

## 2017-10-29 DIAGNOSIS — M542 Cervicalgia: Secondary | ICD-10-CM | POA: Diagnosis not present

## 2017-10-29 DIAGNOSIS — Z1389 Encounter for screening for other disorder: Secondary | ICD-10-CM | POA: Diagnosis not present

## 2017-10-29 DIAGNOSIS — Z13220 Encounter for screening for lipoid disorders: Secondary | ICD-10-CM

## 2017-10-29 DIAGNOSIS — M549 Dorsalgia, unspecified: Secondary | ICD-10-CM | POA: Diagnosis not present

## 2017-10-29 DIAGNOSIS — G8929 Other chronic pain: Secondary | ICD-10-CM

## 2017-10-29 DIAGNOSIS — E039 Hypothyroidism, unspecified: Secondary | ICD-10-CM | POA: Diagnosis not present

## 2017-10-29 LAB — URINALYSIS, ROUTINE W REFLEX MICROSCOPIC
HGB URINE DIPSTICK: NEGATIVE
Ketones, ur: NEGATIVE
Leukocytes, UA: NEGATIVE
Nitrite: NEGATIVE
RBC / HPF: NONE SEEN (ref 0–?)
Specific Gravity, Urine: >=1.03 — AB (ref 1.000–1.030)
Total Protein, Urine: NEGATIVE
Urine Glucose: NEGATIVE
Urobilinogen, UA: 0.2 (ref 0.0–1.0)
pH: 5.5 (ref 5.0–8.0)

## 2017-10-29 LAB — LIPID PANEL
Cholesterol: 160 mg/dL (ref 0–200)
HDL: 66.9 mg/dL (ref >39.00)
LDL Cholesterol: 74 mg/dL (ref 0–99)
NonHDL: 93.36
TRIGLYCERIDES: 99 mg/dL (ref 0.0–149.0)
Total CHOL/HDL Ratio: 2
VLDL: 19.8 mg/dL (ref 0.0–40.0)

## 2017-10-29 LAB — BASIC METABOLIC PANEL
BUN: 15 mg/dL (ref 6–23)
CHLORIDE: 109 meq/L (ref 96–112)
CO2: 27 mEq/L (ref 19–32)
CREATININE: 0.98 mg/dL (ref 0.40–1.20)
Calcium: 9.5 mg/dL (ref 8.4–10.5)
GFR: 59.95 mL/min — ABNORMAL LOW (ref >60.00)
Glucose, Bld: 84 mg/dL (ref 70–99)
Potassium: 4 mEq/L (ref 3.5–5.1)
Sodium: 143 mEq/L (ref 135–145)

## 2017-10-29 LAB — TSH: TSH: 3.66 u[IU]/mL (ref 0.35–4.50)

## 2017-10-29 NOTE — Progress Notes (Signed)
Patient completed Hep B series of three third dose tolerated well.

## 2018-01-02 ENCOUNTER — Other Ambulatory Visit: Payer: Self-pay | Admitting: Obstetrics and Gynecology

## 2018-03-16 ENCOUNTER — Ambulatory Visit: Payer: Medicare Other | Admitting: Internal Medicine

## 2018-03-17 ENCOUNTER — Other Ambulatory Visit: Payer: Self-pay

## 2018-03-17 DIAGNOSIS — Z1211 Encounter for screening for malignant neoplasm of colon: Secondary | ICD-10-CM

## 2018-03-17 MED ORDER — NA SULFATE-K SULFATE-MG SULF 17.5-3.13-1.6 GM/177ML PO SOLN: 1.0000 | Freq: Once | ORAL | 0 refills | Status: AC

## 2018-03-19 ENCOUNTER — Other Ambulatory Visit: Payer: Self-pay

## 2018-03-19 ENCOUNTER — Encounter: Payer: Self-pay | Admitting: *Deleted

## 2018-03-24 ENCOUNTER — Ambulatory Visit: Payer: Medicare Other | Admitting: Internal Medicine

## 2018-03-27 NOTE — Discharge Instructions (Signed)
General Anesthesia, Adult, Care After °These instructions provide you with information about caring for yourself after your procedure. Your health care provider may also give you more specific instructions. Your treatment has been planned according to current medical practices, but problems sometimes occur. Call your health care provider if you have any problems or questions after your procedure. °What can I expect after the procedure? °After the procedure, it is common to have: °· Vomiting. °· A sore throat. °· Mental slowness. ° °It is common to feel: °· Nauseous. °· Cold or shivery. °· Sleepy. °· Tired. °· Sore or achy, even in parts of your body where you did not have surgery. ° °Follow these instructions at home: °For at least 24 hours after the procedure: °· Do not: °? Participate in activities where you could fall or become injured. °? Drive. °? Use heavy machinery. °? Drink alcohol. °? Take sleeping pills or medicines that cause drowsiness. °? Make important decisions or sign legal documents. °? Take care of children on your own. °· Rest. °Eating and drinking °· If you vomit, drink water, juice, or soup when you can drink without vomiting. °· Drink enough fluid to keep your urine clear or pale yellow. °· Make sure you have little or no nausea before eating solid foods. °· Follow the diet recommended by your health care provider. °General instructions °· Have a responsible adult stay with you until you are awake and alert. °· Return to your normal activities as told by your health care provider. Ask your health care provider what activities are safe for you. °· Take over-the-counter and prescription medicines only as told by your health care provider. °· If you smoke, do not smoke without supervision. °· Keep all follow-up visits as told by your health care provider. This is important. °Contact a health care provider if: °· You continue to have nausea or vomiting at home, and medicines are not helpful. °· You  cannot drink fluids or start eating again. °· You cannot urinate after 8-12 hours. °· You develop a skin rash. °· You have fever. °· You have increasing redness at the site of your procedure. °Get help right away if: °· You have difficulty breathing. °· You have chest pain. °· You have unexpected bleeding. °· You feel that you are having a life-threatening or urgent problem. °This information is not intended to replace advice given to you by your health care provider. Make sure you discuss any questions you have with your health care provider. °Document Released: 08/26/2000 Document Revised: 10/23/2015 Document Reviewed: 05/04/2015 °Elsevier Interactive Patient Education © 2018 Elsevier Inc. ° °

## 2018-03-30 ENCOUNTER — Ambulatory Visit: Payer: Medicare Other | Admitting: Anesthesiology

## 2018-03-30 ENCOUNTER — Encounter
Admission: RE | Disposition: A | Discharge status: Home / Self Care | Payer: Self-pay | Source: Ambulatory Visit | Attending: Gastroenterology

## 2018-03-30 ENCOUNTER — Ambulatory Visit
Admission: RE | Admit: 2018-03-30 | Discharge: 2018-03-30 | Disposition: A | Discharge status: Home / Self Care | Payer: Medicare Other | Source: Ambulatory Visit | Attending: Gastroenterology | Admitting: Gastroenterology

## 2018-03-30 DIAGNOSIS — F418 Other specified anxiety disorders: Secondary | ICD-10-CM | POA: Insufficient documentation

## 2018-03-30 DIAGNOSIS — M1990 Unspecified osteoarthritis, unspecified site: Secondary | ICD-10-CM | POA: Diagnosis not present

## 2018-03-30 DIAGNOSIS — Z79899 Other long term (current) drug therapy: Secondary | ICD-10-CM | POA: Insufficient documentation

## 2018-03-30 DIAGNOSIS — Z1211 Encounter for screening for malignant neoplasm of colon: Secondary | ICD-10-CM

## 2018-03-30 DIAGNOSIS — K5730 Diverticulosis of large intestine without perforation or abscess without bleeding: Secondary | ICD-10-CM | POA: Insufficient documentation

## 2018-03-30 DIAGNOSIS — Z79890 Hormone replacement therapy (postmenopausal): Secondary | ICD-10-CM | POA: Insufficient documentation

## 2018-03-30 DIAGNOSIS — E039 Hypothyroidism, unspecified: Secondary | ICD-10-CM | POA: Diagnosis not present

## 2018-03-30 DIAGNOSIS — D122 Benign neoplasm of ascending colon: Secondary | ICD-10-CM

## 2018-03-30 DIAGNOSIS — E7800 Pure hypercholesterolemia, unspecified: Secondary | ICD-10-CM | POA: Insufficient documentation

## 2018-03-30 DIAGNOSIS — K219 Gastro-esophageal reflux disease without esophagitis: Secondary | ICD-10-CM | POA: Diagnosis not present

## 2018-03-30 HISTORY — DX: Other cervical disc displacement, unspecified cervical region: M50.20

## 2018-03-30 HISTORY — DX: Presence of dental prosthetic device (complete) (partial): Z97.2

## 2018-03-30 HISTORY — PX: COLONOSCOPY WITH PROPOFOL: SHX5780

## 2018-03-30 HISTORY — PX: POLYPECTOMY: SHX5525

## 2018-03-30 SURGERY — COLONOSCOPY WITH PROPOFOL
Anesthesia: General | Site: Rectum

## 2018-03-30 MED ORDER — ONDANSETRON HCL 4 MG/2ML IJ SOLN: 4.0000 mg | Freq: Once | INTRAMUSCULAR | Status: DC | PRN

## 2018-03-30 MED ORDER — PROPOFOL 10 MG/ML IV BOLUS
INTRAVENOUS | Status: DC | PRN
  Administered 2018-03-30 (×2): 20 mg via INTRAVENOUS
  Administered 2018-03-30: 30 mg via INTRAVENOUS
  Administered 2018-03-30: 20 mg via INTRAVENOUS
  Administered 2018-03-30: 40 mg via INTRAVENOUS
  Administered 2018-03-30: 20 mg via INTRAVENOUS
  Administered 2018-03-30: 30 mg via INTRAVENOUS
  Administered 2018-03-30 (×2): 20 mg via INTRAVENOUS
  Administered 2018-03-30: 70 mg via INTRAVENOUS

## 2018-03-30 MED ORDER — STERILE WATER FOR IRRIGATION IR SOLN
Status: DC | PRN
  Administered 2018-03-30: 09:00:00

## 2018-03-30 MED ORDER — SODIUM CHLORIDE 0.9 % IV SOLN: INTRAVENOUS | Status: DC

## 2018-03-30 MED ORDER — LIDOCAINE HCL (CARDIAC) PF 100 MG/5ML IV SOSY
PREFILLED_SYRINGE | INTRAVENOUS | Status: DC | PRN
  Administered 2018-03-30: 40 mg via INTRAVENOUS

## 2018-03-30 MED ORDER — LACTATED RINGERS IV SOLN
10.0000 mL/h | INTRAVENOUS | Status: DC
  Administered 2018-03-30: 10 mL/h via INTRAVENOUS

## 2018-03-30 SURGICAL SUPPLY — 7 items
CANISTER SUCT 1200ML W/VALVE (MISCELLANEOUS) ×3 IMPLANT
GOWN CVR UNV OPN BCK APRN NK (MISCELLANEOUS) ×2 IMPLANT
GOWN ISOL THUMB LOOP REG UNIV (MISCELLANEOUS) ×4
KIT ENDO PROCEDURE OLY (KITS) ×3 IMPLANT
SNARE SHORT THROW 13M SML OVAL (MISCELLANEOUS) ×3 IMPLANT
TRAP ETRAP POLY (MISCELLANEOUS) ×3 IMPLANT
WATER STERILE IRR 250ML POUR (IV SOLUTION) ×3 IMPLANT

## 2018-03-30 NOTE — Anesthesia Procedure Notes (Signed)
Procedure Name: MAC Date/Time: 03/30/2018 9:11 AM Performed by: Janna Arch, CRNA Pre-anesthesia Checklist: Patient identified, Emergency Drugs available, Suction available, Timeout performed and Patient being monitored Patient Re-evaluated:Patient Re-evaluated prior to induction Oxygen Delivery Method: Nasal cannula Placement Confirmation: positive ETCO2

## 2018-03-30 NOTE — Anesthesia Postprocedure Evaluation (Signed)
Anesthesia Post Note  Patient: Sandra Horton  Procedure(s) Performed: COLONOSCOPY WITH Biopsies (N/A Rectum) POLYPECTOMY (N/A Rectum)  Patient location during evaluation: PACU Anesthesia Type: General Level of consciousness: awake Pain management: pain level controlled Vital Signs Assessment: post-procedure vital signs reviewed and stable Respiratory status: respiratory function stable Cardiovascular status: stable Postop Assessment: no signs of nausea or vomiting Anesthetic complications: no    Veda Canning

## 2018-03-30 NOTE — H&P (Signed)
Sandra Lame, MD [address] [facility], [place] [address] Phone: [phone] Fax : [phone]  Primary Care Physician:  McLean-Scocuzza, Nino Glow, MD Primary Gastroenterologist:  Dr. Allen Norris  Pre-Procedure History & Physical: HPI:  Sandra Horton is a 67 y.o. female is here for a screening colonoscopy.   Past Medical History:  Diagnosis Date  . Allergy   . Anxiety   . Arthritis   . BRCA negative 2016   MyRisk neg   . Depression   . Family history of ovarian cancer 2016   MyRisk neg; affected sister is BRCA neg  . GERD (gastroesophageal reflux disease)   . Herpes simplex   . High cholesterol   . Hypercholesteremia   . Hyperlipidemia   . Hypothyroidism   . Osteopenia    DEXA 09/02/13   . Rosacea   . Ruptured disc, cervical   . Thyroid disease   . Vertigo    per pt cervicogenic Physical therapy helped   . Vitamin D deficiency   . Wears dentures    full upper    Past Surgical History:  Procedure Laterality Date  . CERVICAL BIOPSY  W/ LOOP ELECTRODE EXCISION    . COLONOSCOPY    . DILATION AND CURETTAGE OF UTERUS  30 years ago  . EYE SURGERY     b/l cataract     Prior to Admission medications   Medication Sig Start Date End Date Taking? Authorizing Provider  BIOTIN PO Take 1 tablet by mouth daily.   Yes [provider]  cetirizine (ZYRTEC) 10 MG tablet Take 10 mg by mouth daily.   Yes [provider]  Cholecalciferol (VITAMIN D3) 10000 units TABS Take by mouth.   Yes [provider]  docusate sodium (COLACE) 100 MG capsule Take 100 mg by mouth 2 (two) times daily as needed for mild constipation.   Yes [provider]  levothyroxine (SYNTHROID, LEVOTHROID) 88 MCG tablet Take 1 tablet (88 mcg total) by mouth daily before breakfast. Empty stomach 09/08/17  Yes McLean-Scocuzza, Nino Glow, MD  lovastatin (MEVACOR) 40 MG tablet Take 1 tablet (40 mg total) by mouth at bedtime. 09/08/17  Yes McLean-Scocuzza, Nino Glow, MD  magnesium  chloride (SLOW-MAG) 64 MG TBEC SR tablet Take 1 tablet by mouth.   Yes [provider]  Meclizine HCl (ANTIVERT PO) Take by mouth as needed.   Yes [provider]  meloxicam (MOBIC) 15 MG tablet Take 1 tablet (15 mg total) by mouth daily as needed for pain. 09/08/17  Yes McLean-Scocuzza, Nino Glow, MD  omeprazole (PRILOSEC) 20 MG capsule Take 1 capsule (20 mg total) 2 (two) times daily by mouth. Wait 3 hours after thyroid med 04/21/17  Yes McLean-Scocuzza, Nino Glow, MD  PARoxetine (PAXIL) 20 MG tablet Take 1 tablet (20 mg total) daily by mouth. 04/21/17  Yes McLean-Scocuzza, Nino Glow, MD  polyethylene glycol powder (GLYCOLAX/MIRALAX) powder Take 17 g by mouth daily as needed. 07/12/17  Yes McLean-Scocuzza, Nino Glow, MD  valACYclovir (VALTREX) 500 MG tablet TAKE 1 TABLET BY MOUTH EVERY DAY 07/08/93  Yes Copland, Alicia B, PA-C  vitamin B-12 (CYANOCOBALAMIN) 500 MCG tablet Take 500 mcg by mouth daily.   Yes [provider]  aspirin 81 MG tablet Take 81 mg by mouth daily.    [provider]  estradiol (ESTRACE VAGINAL) 0.1 MG/GM vaginal cream APPLY 1 GRAM BY VAGINAL ROUTE ONCE WEEKLY Patient not taking: Reported on 03/19/2018 63/87/56   Copland, Deirdre Evener, PA-C  PARoxetine (PAXIL) 20 MG tablet TAKE 1 TABLET BY MOUTH EVERY DAY Patient not taking: Reported on 03/30/2018 10/08/17   McLean-Scocuzza, Nino Glow, MD    Allergies as of 03/17/2018 - Review Complete 09/08/2017  Allergen Reaction Noted  . Penicillin v potassium Anaphylaxis 11/03/2014  . Penicillins Anaphylaxis 11/03/2014  . Tetanus toxoid Nausea And Vomiting 11/03/2014    Family History  Problem Relation Age of Onset  . Cancer Mother        kidney met to lungs   . Diabetes Mother   . Thyroid disease Mother   . Hypertension Father   . Stroke Father   . Thyroid disease Sister   . Ovarian cancer Sister 85       BRCA neg  . Diabetes Daughter        type 1   . Colon cancer Maternal Aunt 8  . Heart disease Other    . Breast cancer Neg Hx     Social History   Socioeconomic History  . Marital status: Married    Spouse name: Not on file  . Number of children: Not on file  . Years of education: Not on file  . Highest education level: Not on file  Occupational History  . Not on file  Social Needs  . Financial resource strain: Not on file  . Food insecurity:    Worry: Not on file    Inability: Not on file  . Transportation needs:    Medical: Not on file    Non-medical: Not on file  Tobacco Use  . Smoking status: Never Smoker  . Smokeless tobacco: Never Used  Substance and Sexual Activity  . Alcohol use: No    Alcohol/week: 0.0 standard drinks  . Drug use: No  . Sexual activity: Yes    Birth control/protection: Post-menopausal  Lifestyle  . Physical activity:    Days per week: Not on file    Minutes per session: Not on file  . Stress: Not on file  Relationships  . Social connections:    Talks on phone: Not on file    Gets together: Not on file    Attends religious service: Not on file    Active member of club or organization: Not on file    Attends meetings of clubs or organizations: Not on file    Relationship status: Not on file  . Intimate partner violence:    Fear of current or ex partner: Not on file    Emotionally abused: Not on file    Physically abused: Not on file    Forced sexual activity: Not on file  Other Topics Concern  . Not on file  Social History Narrative   Works part time for [employer]     Review of Systems: See HPI, otherwise negative ROS  Physical Exam: BP 110/67   Pulse 62   Temp (!) 97.5 F (36.4 C) (Temporal)   Resp 16   Ht '5\' 3"'$  (1.6 m)   Wt 54.4 kg   SpO2 100%   BMI 21.26 kg/m  General:   Alert,  pleasant and cooperative in NAD Head:  Normocephalic and atraumatic. Neck:  Supple; no masses or thyromegaly. Lungs:  Clear throughout to auscultation.    Heart:  Regular rate and rhythm. Abdomen:  Soft, nontender and nondistended.  Normal bowel sounds, without guarding, and without rebound.   Neurologic:  Alert and  oriented x4;  grossly normal neurologically.  Impression/Plan: [name] is now here to  undergo a screening colonoscopy.  Risks, benefits, and alternatives regarding colonoscopy have been reviewed with the patient.  Questions have been answered.  All parties agreeable.

## 2018-03-30 NOTE — Transfer of Care (Signed)
Immediate Anesthesia Transfer of Care Note  Patient: Sandra Horton  Procedure(s) Performed: COLONOSCOPY WITH Biopsies (N/A Rectum) POLYPECTOMY (N/A Rectum)  Patient Location: PACU  Anesthesia Type: General  Level of Consciousness: awake, alert  and patient cooperative  Airway and Oxygen Therapy: Patient Spontanous Breathing and Patient connected to supplemental oxygen  Post-op Assessment: Post-op Vital signs reviewed, Patient's Cardiovascular Status Stable, Respiratory Function Stable, Patent Airway and No signs of Nausea or vomiting  Post-op Vital Signs: Reviewed and stable  Complications: No apparent anesthesia complications

## 2018-03-30 NOTE — Op Note (Signed)
[facility] Gastroenterology Patient Name: Sandra Horton Procedure Date: 03/30/2018 9:07 AM MRN: [id number] Account #: [id number] Date of Birth: 02-Sep-1950 Admit Type: Outpatient Age: 67 Room: [facility] OR ROOM 01 Gender: Female Note Status: Finalized Procedure:            Colonoscopy Indications:          Screening for colorectal malignant neoplasm Providers:            [name] MD, MD Referring MD:         Nino Glow Mclean-Scocuzza MD, MD (Referring MD) Medicines:            Propofol per Anesthesia Complications:        No immediate complications. Procedure:            Pre-Anesthesia Assessment:                       - Prior to the procedure, a History and Physical was                        performed, and patient medications and allergies were                        reviewed. The patient's tolerance of previous                        anesthesia was also reviewed. The risks and benefits of                        the procedure and the sedation options and risks were                        discussed with the patient. All questions were                        answered, and informed consent was obtained. Prior                        Anticoagulants: The patient has taken no previous                        anticoagulant or antiplatelet agents. ASA Grade                        Assessment: II - A patient with mild systemic disease.                        After reviewing the risks and benefits, the patient was                        deemed in satisfactory condition to undergo the                        procedure.                       After obtaining informed consent, the colonoscope was                        passed under direct vision. Throughout the procedure,  the patient's blood pressure, pulse, and oxygen                        saturations were monitored continuously. The                        Colonoscope was introduced through the anus and                     advanced to the the cecum, identified by appendiceal                        orifice and ileocecal valve. The colonoscopy was                        performed without difficulty. The patient tolerated the                        procedure well. The quality of the bowel preparation                        was excellent. Findings:      The perianal and digital rectal examinations were normal.      Two sessile polyps were found in the ascending colon. The polyps were 4       to 6 mm in size. These polyps were removed with a cold snare. Resection       and retrieval were complete.      A few small-mouthed diverticula were found in the sigmoid colon. Impression:           - Two 4 to 6 mm polyps in the ascending colon, removed                        with a cold snare. Resected and retrieved.                       - Diverticulosis in the sigmoid colon. Recommendation:       - Discharge patient to home.                       - Resume previous diet.                       - Continue present medications.                       - Await pathology results.                       - Repeat colonoscopy in 5 years if polyp adenoma and 10                        years if hyperplastic Procedure Code(s):    --- Professional ---                       [id number], Colonoscopy, flexible; with removal of tumor(s),                        polyp(s), or other lesion(s) by snare technique Diagnosis Code(s):    --- Professional ---  Z12.11, Encounter for screening for malignant neoplasm                        of colon                       D12.2, Benign neoplasm of ascending colon CPT copyright 2018 American Medical Association. All rights reserved. The codes documented in this report are preliminary and upon coder review may  be revised to meet current compliance requirements. [name] MD, MD 03/30/2018 9:33:42 AM This report has been signed electronically. Number of Addenda: 0 Note  Initiated On: 03/30/2018 9:07 AM Scope Withdrawal Time: 0 hours 9 minutes 44 seconds  Total Procedure Duration: 0 hours 16 minutes 51 seconds       [facility]

## 2018-03-30 NOTE — Anesthesia Preprocedure Evaluation (Signed)
Anesthesia Evaluation  Patient identified by MRN, date of birth, ID band Patient awake    Reviewed: Allergy & Precautions, NPO status , Patient's Chart, lab work & pertinent test results  Airway Mallampati: II  TM Distance: >3 FB     Dental   Pulmonary    breath sounds clear to auscultation       Cardiovascular  Rhythm:Regular Rate:Normal  HLD   Neuro/Psych Anxiety Depression    GI/Hepatic GERD  ,  Endo/Other  Hypothyroidism   Renal/GU      Musculoskeletal  (+) Arthritis ,   Abdominal   Peds  Hematology   Anesthesia Other Findings   Reproductive/Obstetrics                             Anesthesia Physical Anesthesia Plan  ASA: II  Anesthesia Plan: General   Post-op Pain Management:    Induction:   PONV Risk Score and Plan:   Airway Management Planned: Natural Airway and Simple Face Mask  Additional Equipment:   Intra-op Plan:   Post-operative Plan:   Informed Consent: I have reviewed the patients History and Physical, chart, labs and discussed the procedure including the risks, benefits and alternatives for the proposed anesthesia with the patient or authorized representative who has indicated his/her understanding and acceptance.     Plan Discussed with: CRNA  Anesthesia Plan Comments:         Anesthesia Quick Evaluation

## 2018-03-31 ENCOUNTER — Encounter: Payer: Self-pay | Admitting: Gastroenterology

## 2018-04-02 ENCOUNTER — Encounter: Payer: Self-pay | Admitting: Internal Medicine

## 2018-04-02 ENCOUNTER — Ambulatory Visit (INDEPENDENT_AMBULATORY_CARE_PROVIDER_SITE_OTHER): Payer: Medicare Other | Admitting: Internal Medicine

## 2018-04-02 VITALS — BP 102/60 | HR 70 | Temp 98.1°F | Ht 63.0 in | Wt 123.6 lb

## 2018-04-02 DIAGNOSIS — Z23 Encounter for immunization: Secondary | ICD-10-CM | POA: Diagnosis not present

## 2018-04-02 DIAGNOSIS — M8580 Other specified disorders of bone density and structure, unspecified site: Secondary | ICD-10-CM | POA: Diagnosis not present

## 2018-04-02 DIAGNOSIS — Z0000 Encounter for general adult medical examination without abnormal findings: Secondary | ICD-10-CM

## 2018-04-02 DIAGNOSIS — G8929 Other chronic pain: Secondary | ICD-10-CM

## 2018-04-02 DIAGNOSIS — F329 Major depressive disorder, single episode, unspecified: Secondary | ICD-10-CM

## 2018-04-02 DIAGNOSIS — Z1231 Encounter for screening mammogram for malignant neoplasm of breast: Secondary | ICD-10-CM | POA: Diagnosis not present

## 2018-04-02 DIAGNOSIS — F32A Depression, unspecified: Secondary | ICD-10-CM

## 2018-04-02 DIAGNOSIS — E039 Hypothyroidism, unspecified: Secondary | ICD-10-CM | POA: Diagnosis not present

## 2018-04-02 DIAGNOSIS — M549 Dorsalgia, unspecified: Secondary | ICD-10-CM

## 2018-04-02 DIAGNOSIS — M542 Cervicalgia: Secondary | ICD-10-CM | POA: Diagnosis not present

## 2018-04-02 LAB — CBC WITH DIFFERENTIAL/PLATELET
BASOS ABS: 0 10*3/uL (ref 0.0–0.1)
BASOS PCT: 0.7 % (ref 0.0–3.0)
EOS ABS: 0.1 10*3/uL (ref 0.0–0.7)
Eosinophils Relative: 2.2 % (ref 0.0–5.0)
HCT: 40.2 % (ref 36.0–46.0)
HEMOGLOBIN: 13.4 g/dL (ref 12.0–15.0)
LYMPHS PCT: 33.1 % (ref 12.0–46.0)
Lymphs Abs: 1.4 10*3/uL (ref 0.7–4.0)
MCHC: 33.3 g/dL (ref 30.0–36.0)
MCV: 96.2 fl (ref 78.0–100.0)
MONOS PCT: 9.8 % (ref 3.0–12.0)
Monocytes Absolute: 0.4 10*3/uL (ref 0.1–1.0)
Neutro Abs: 2.3 10*3/uL (ref 1.4–7.7)
Neutrophils Relative %: 54.2 % (ref 43.0–77.0)
Platelets: 208 10*3/uL (ref 150.0–400.0)
RBC: 4.18 Mil/uL (ref 3.87–5.11)
RDW: 12.8 % (ref 11.5–15.5)
WBC: 4.2 10*3/uL (ref 4.0–10.5)

## 2018-04-02 LAB — COMPREHENSIVE METABOLIC PANEL
ALT: 21 U/L (ref 0–35)
AST: 22 U/L (ref 0–37)
Albumin: 4.4 g/dL (ref 3.5–5.2)
Alkaline Phosphatase: 52 U/L (ref 39–117)
BUN: 11 mg/dL (ref 6–23)
CO2: 32 mEq/L (ref 19–32)
CREATININE: 0.88 mg/dL (ref 0.40–1.20)
Calcium: 10 mg/dL (ref 8.4–10.5)
Chloride: 104 mEq/L (ref 96–112)
GFR: 67.79 mL/min (ref >60.00)
GLUCOSE: 86 mg/dL (ref 70–99)
POTASSIUM: 4 meq/L (ref 3.5–5.1)
Sodium: 140 mEq/L (ref 135–145)
TOTAL PROTEIN: 7.2 g/dL (ref 6.0–8.3)
Total Bilirubin: 0.5 mg/dL (ref 0.2–1.2)

## 2018-04-02 LAB — TSH: TSH: 4.46 u[IU]/mL (ref 0.35–4.50)

## 2018-04-02 MED ORDER — MELOXICAM 7.5 MG PO TABS: 7.5000 mg | ORAL_TABLET | Freq: Two times a day (BID) | ORAL | 0 refills | Status: DC | PRN

## 2018-04-02 MED ORDER — CHOLECALCIFEROL 25 MCG (1000 UT) PO TABS: 1000.0000 [IU] | ORAL_TABLET | Freq: Every day | ORAL | Status: DC

## 2018-04-02 NOTE — Patient Instructions (Addendum)
D3 5000 IU daily max  rec pneumonia 23 vaccine due 04/21/2018 schedule nurse visit  Consider shingrix vaccine  Consider repeat bone density in the future after 09/2018     Pneumonia 23 Pneumococcal Polysaccharide Vaccine: What You Need to Know 1. Why get vaccinated? Vaccination can protect older adults (and some children and younger adults) from pneumococcal disease. Pneumococcal disease is caused by bacteria that can spread from person to person through close contact. It can cause ear infections, and it can also lead to more serious infections of the:  Lungs (pneumonia),  Blood (bacteremia), and  Covering of the brain and spinal cord (meningitis). Meningitis can cause deafness and brain damage, and it can be fatal.  Anyone can get pneumococcal disease, but children under 36 years of age, people with certain medical conditions, adults over 49 years of age, and cigarette smokers are at the highest risk. About 18,000 older adults die each year from pneumococcal disease in the [place]. Treatment of pneumococcal infections with penicillin and other drugs used to be more effective. But some strains of the disease have become resistant to these drugs. This makes prevention of the disease, through vaccination, even more important. 2. Pneumococcal polysaccharide vaccine (PPSV23) Pneumococcal polysaccharide vaccine (PPSV23) protects against 23 types of pneumococcal bacteria. It will not prevent all pneumococcal disease. PPSV23 is recommended for:  All adults 33 years of age and older,  Anyone 2 through 68 years of age with certain long-term health problems,  Anyone 2 through 68 years of age with a weakened immune system,  Adults 70 through 68 years of age who smoke cigarettes or have asthma.  Most people need only one dose of PPSV. A second dose is recommended for certain high-risk groups. People 43 and older should get a dose even if they have gotten one or more doses of the vaccine  before they turned 65. Your healthcare provider can give you more information about these recommendations. Most healthy adults develop protection within 2 to 3 weeks of getting the shot. 3. Some people should not get this vaccine  Anyone who has had a life-threatening allergic reaction to PPSV should not get another dose.  Anyone who has a severe allergy to any component of PPSV should not receive it. Tell your provider if you have any severe allergies.  Anyone who is moderately or severely ill when the shot is scheduled may be asked to wait until they recover before getting the vaccine. Someone with a mild illness can usually be vaccinated.  Children less than 44 years of age should not receive this vaccine.  There is no evidence that PPSV is harmful to either a pregnant woman or to her fetus. However, as a precaution, women who need the vaccine should be vaccinated before becoming pregnant, if possible. 4. Risks of a vaccine reaction With any medicine, including vaccines, there is a chance of side effects. These are usually mild and go away on their own, but serious reactions are also possible. About half of people who get PPSV have mild side effects, such as redness or pain where the shot is given, which go away within about two days. Less than 1 out of 100 people develop a fever, muscle aches, or more severe local reactions. Problems that could happen after any vaccine:  People sometimes faint after a medical procedure, including vaccination. Sitting or lying down for about 15 minutes can help prevent fainting, and injuries caused by a fall. Tell your doctor if you feel dizzy,  or have vision changes or ringing in the ears.  Some people get severe pain in the shoulder and have difficulty moving the arm where a shot was given. This happens very rarely.  Any medication can cause a severe allergic reaction. Such reactions from a vaccine are very rare, estimated at about 1 in a million doses,  and would happen within a few minutes to a few hours after the vaccination. As with any medicine, there is a very remote chance of a vaccine causing a serious injury or death. The safety of vaccines is always being monitored. For more information, visit: http://www.aguilar.org/ 5. What if there is a serious reaction? What should I look for? Look for anything that concerns you, such as signs of a severe allergic reaction, very high fever, or unusual behavior. Signs of a severe allergic reaction can include hives, swelling of the face and throat, difficulty breathing, a fast heartbeat, dizziness, and weakness. These would usually start a few minutes to a few hours after the vaccination. What should I do? If you think it is a severe allergic reaction or other emergency that can't wait, call 9-1-1 or get to the nearest hospital. Otherwise, call your doctor. Afterward, the reaction should be reported to the Vaccine Adverse Event Reporting System (VAERS). Your doctor might file this report, or you can do it yourself through the VAERS web site at www.vaers.SamedayNews.es, or by calling 585-847-4420. VAERS does not give medical advice. 6. How can I learn more?  Ask your doctor. He or she can give you the vaccine package insert or suggest other sources of information.  Call your local or state health department.  Contact the Centers for Disease Control and Prevention (CDC): ? Call 804-099-3878 (1-800-CDC-INFO) or ? Visit CDC's website at http://hunter.com/ CDC Pneumococcal Polysaccharide Vaccine VIS (09/24/13) This information is not intended to replace advice given to you by your health care provider. Make sure you discuss any questions you have with your health care provider. Document Released: 03/17/2006 Document Revised: 02/08/2016 Document Reviewed: 02/08/2016 Elsevier Interactive Patient Education  2017 [employer].

## 2018-04-02 NOTE — Progress Notes (Signed)
Pre visit review using our clinic review tool, if applicable. No additional management support is needed unless otherwise documented below in the visit note. 

## 2018-04-02 NOTE — Progress Notes (Signed)
Chief Complaint  Patient presents with  . Follow-up   F/u  1. Hypothyroidism on lev 88 mcg qd  2. Depression controlled on paxil 20 mg qd x years tried to cut back but more irritable  3. Chronic neck and back pain improved with mobic 7.5 bid prn 5/10 at times or less intermittent   Review of Systems  Constitutional: Negative for weight loss.  HENT: Negative for hearing loss.   Eyes: Negative for blurred vision.  Respiratory: Negative for shortness of breath.   Cardiovascular: Negative for chest pain.  Musculoskeletal: Negative for neck pain.  Skin: Negative for rash.  Neurological: Negative for headaches.  Psychiatric/Behavioral: Negative for depression.   Past Medical History:  Diagnosis Date  . Allergy   . Anxiety   . Arthritis   . BRCA negative 2016   MyRisk neg   . Depression   . Family history of ovarian cancer 2016   MyRisk neg; affected sister is BRCA neg  . GERD (gastroesophageal reflux disease)   . Herpes simplex   . High cholesterol   . Hypercholesteremia   . Hyperlipidemia   . Hypothyroidism   . Osteopenia    DEXA 09/02/13   . Rosacea   . Ruptured disc, cervical   . Thyroid disease   . Vertigo    per pt cervicogenic Physical therapy helped   . Vitamin D deficiency   . Wears dentures    full upper   Past Surgical History:  Procedure Laterality Date  . CERVICAL BIOPSY  W/ LOOP ELECTRODE EXCISION    . COLONOSCOPY    . COLONOSCOPY WITH PROPOFOL N/A 03/30/2018   Procedure: COLONOSCOPY WITH Biopsies;  Surgeon: [name], MD;  Location: [facility];  Service: Endoscopy;  Laterality: N/A;  . DILATION AND CURETTAGE OF UTERUS  30 years ago  . EYE SURGERY     b/l cataract   . POLYPECTOMY N/A 03/30/2018   Procedure: POLYPECTOMY;  Surgeon: [name], MD;  Location: [facility];  Service: Endoscopy;  Laterality: N/A;   Family History  Problem Relation Age of Onset  . Cancer Mother        kidney met to lungs   . Diabetes Mother   .  Thyroid disease Mother   . Hypertension Father   . Stroke Father   . Thyroid disease Sister   . Ovarian cancer Sister 33       BRCA neg  . Diabetes Daughter        type 1   . Colon cancer Maternal Aunt 8  . Heart disease Other   . Breast cancer Neg Hx    Social History   Socioeconomic History  . Marital status: Married    Spouse name: Not on file  . Number of children: Not on file  . Years of education: Not on file  . Highest education level: Not on file  Occupational History  . Not on file  Social Needs  . Financial resource strain: Not on file  . Food insecurity:    Worry: Not on file    Inability: Not on file  . Transportation needs:    Medical: Not on file    Non-medical: Not on file  Tobacco Use  . Smoking status: Never Smoker  . Smokeless tobacco: Never Used  Substance and Sexual Activity  . Alcohol use: No    Alcohol/week: 0.0 standard drinks  . Drug use: No  . Sexual activity: Yes    Birth control/protection: Post-menopausal  Lifestyle  . Physical activity:    Days per week: Not on file    Minutes per session: Not on file  . Stress: Not on file  Relationships  . Social connections:    Talks on phone: Not on file    Gets together: Not on file    Attends religious service: Not on file    Active member of club or organization: Not on file    Attends meetings of clubs or organizations: Not on file    Relationship status: Not on file  . Intimate partner violence:    Fear of current or ex partner: Not on file    Emotionally abused: Not on file    Physically abused: Not on file    Forced sexual activity: Not on file  Other Topics Concern  . Not on file  Social History Narrative   Works part time for [employer]    Current Meds  Medication Sig  . aspirin 81 MG tablet Take 81 mg by mouth daily.  Marland Kitchen BIOTIN PO Take 1 tablet by mouth daily.  . cetirizine (ZYRTEC) 10 MG tablet Take 10 mg by mouth daily.  . Cholecalciferol (VITAMIN D3) 10000 units  TABS Take by mouth.  . docusate sodium (COLACE) 100 MG capsule Take 100 mg by mouth 2 (two) times daily as needed for mild constipation.  Marland Kitchen estradiol (ESTRACE VAGINAL) 0.1 MG/GM vaginal cream APPLY 1 GRAM BY VAGINAL ROUTE ONCE WEEKLY  . levothyroxine (SYNTHROID, LEVOTHROID) 88 MCG tablet Take 1 tablet (88 mcg total) by mouth daily before breakfast. Empty stomach  . lovastatin (MEVACOR) 40 MG tablet Take 1 tablet (40 mg total) by mouth at bedtime.  . magnesium chloride (SLOW-MAG) 64 MG TBEC SR tablet Take 1 tablet by mouth.  . Meclizine HCl (ANTIVERT PO) Take by mouth as needed.  . meloxicam (MOBIC) 15 MG tablet Take 1 tablet (15 mg total) by mouth daily as needed for pain.  Marland Kitchen omeprazole (PRILOSEC) 20 MG capsule Take 1 capsule (20 mg total) 2 (two) times daily by mouth. Wait 3 hours after thyroid med  . PARoxetine (PAXIL) 20 MG tablet TAKE 1 TABLET BY MOUTH EVERY DAY  . polyethylene glycol powder (GLYCOLAX/MIRALAX) powder Take 17 g by mouth daily as needed.  . valACYclovir (VALTREX) 500 MG tablet TAKE 1 TABLET BY MOUTH EVERY DAY  . vitamin B-12 (CYANOCOBALAMIN) 500 MCG tablet Take 500 mcg by mouth daily.   Allergies  Allergen Reactions  . Penicillin V Potassium Anaphylaxis  . Penicillins Anaphylaxis  . Tetanus Toxoid Nausea And Vomiting   No results found for this or any previous visit (from the past 2160 hour(s)). Objective  Body mass index is 21.89 kg/m. Wt Readings from Last 3 Encounters:  04/02/18 123 lb 9.6 oz (56.1 kg)  03/30/18 120 lb (54.4 kg)  09/08/17 126 lb 12.8 oz (57.5 kg)   Temp Readings from Last 3 Encounters:  04/02/18 98.1 F (36.7 C) (Oral)  03/30/18 (!) 97.4 F (36.3 C)  09/08/17 98.1 F (36.7 C) (Oral)   BP Readings from Last 3 Encounters:  04/02/18 102/60  03/30/18 97/69  09/08/17 (!) 104/58   Pulse Readings from Last 3 Encounters:  04/02/18 70  03/30/18 64  09/08/17 67    Physical Exam  Constitutional: She is oriented to person, place, and time.  Vital signs are normal. She appears well-developed and well-nourished. She is cooperative.  HENT:  Head: Normocephalic and atraumatic.  Mouth/Throat: Oropharynx is clear and moist and mucous  membranes are normal.  Eyes: Pupils are equal, round, and reactive to light. Conjunctivae are normal.  Cardiovascular: Normal rate, regular rhythm and normal heart sounds.  Pulmonary/Chest: Effort normal and breath sounds normal.  Neurological: She is alert and oriented to person, place, and time. Gait normal.  Skin: Skin is warm, dry and intact.  Psychiatric: She has a normal mood and affect. Her speech is normal and behavior is normal. Judgment and thought content normal. Cognition and memory are normal.  Nursing note and vitals reviewed.   Assessment   1. Hypothyroidism  2. Depression  3. Chronic neck and back pain  4. HM Plan   1. Levo 88 mcg qd  2. Cont paxil 20 mg qd  3. If bothers will do Xray  Prn mobic 7.5 bid prn  4. Had flu shot today high dose  utd prevnar will need pna 23 in 1 year 04/2018 Allergic to Tdap  Had 3/3 hep B vaccines  Had zostavax but disc shingrix given Rx today   mammo 05/23/17 neg referred today   Last pap [facility] 2017 will get records at f/u of note had genetic testing ovarian cancer with OB/GYN neg  -pap 03/23/15 neg pap HPV neg Dr. Laurey Morale  -appt [facility] [id number] OB/GYN  colonoscopy 03/30/18 2 4-6 mm polpys diverticulosis tubular and sessile serrated Dr. Allen Norris f/u in 3-5 years   DEXA had 09/02/13 osteopenia repeat DEXA ordered on vit D3 1000 mg qd rec repeat 09/2018    Dermatology appt due 04/2018 [facility].  Never smoker, no etoh, drugs   Provider: Dr. Olivia Mackie McLean-Scocuzza-[facility]

## 2018-04-22 ENCOUNTER — Ambulatory Visit (INDEPENDENT_AMBULATORY_CARE_PROVIDER_SITE_OTHER): Payer: Medicare Other | Admitting: *Deleted

## 2018-04-22 DIAGNOSIS — Z23 Encounter for immunization: Secondary | ICD-10-CM

## 2018-06-01 ENCOUNTER — Other Ambulatory Visit: Payer: Self-pay | Admitting: Obstetrics and Gynecology

## 2018-06-04 ENCOUNTER — Telehealth: Payer: Self-pay | Admitting: Obstetrics and Gynecology

## 2018-06-04 ENCOUNTER — Other Ambulatory Visit: Payer: Self-pay | Admitting: Obstetrics and Gynecology

## 2018-06-04 DIAGNOSIS — N952 Postmenopausal atrophic vaginitis: Secondary | ICD-10-CM

## 2018-06-04 MED ORDER — ESTRADIOL 0.1 MG/GM VA CREA: TOPICAL_CREAM | VAGINAL | 1 refills | Status: DC

## 2018-06-04 NOTE — Progress Notes (Signed)
Rx RF estrace vag crm till annual due next yr due to Medicare. Last seen 11/18. No vag complaints.

## 2018-06-04 NOTE — Telephone Encounter (Signed)
Patient was schedule 06/08/17 for annual. Patient isn't due for pap but every two years due to Medicare restrictions. Patient is requesting her Estrace vaginal clean to be sent in as generic please. Patient has no questions or concerns at this time.

## 2018-06-04 NOTE — Telephone Encounter (Signed)
Rx RF eRxd.  

## 2018-06-05 ENCOUNTER — Other Ambulatory Visit: Payer: Self-pay

## 2018-06-05 ENCOUNTER — Encounter: Payer: Self-pay | Admitting: Internal Medicine

## 2018-06-05 MED ORDER — OMEPRAZOLE 20 MG PO CPDR: 20.0000 mg | DELAYED_RELEASE_CAPSULE | Freq: Two times a day (BID) | ORAL | 1 refills | Status: DC

## 2018-06-08 ENCOUNTER — Ambulatory Visit: Payer: Medicare Other | Admitting: Obstetrics and Gynecology

## 2018-06-08 ENCOUNTER — Other Ambulatory Visit: Payer: Self-pay | Admitting: Internal Medicine

## 2018-06-08 MED ORDER — LOVASTATIN 40 MG PO TABS: 40.0000 mg | ORAL_TABLET | Freq: Every day | ORAL | 3 refills | Status: DC

## 2018-06-10 ENCOUNTER — Other Ambulatory Visit: Payer: Self-pay | Admitting: Internal Medicine

## 2018-06-10 MED ORDER — LEVOTHYROXINE SODIUM 88 MCG PO TABS: 88.0000 ug | ORAL_TABLET | Freq: Every day | ORAL | 3 refills | Status: DC

## 2018-06-20 ENCOUNTER — Encounter: Payer: Self-pay | Admitting: Internal Medicine

## 2018-06-22 ENCOUNTER — Other Ambulatory Visit: Payer: Self-pay | Admitting: Internal Medicine

## 2018-06-22 DIAGNOSIS — K219 Gastro-esophageal reflux disease without esophagitis: Secondary | ICD-10-CM

## 2018-06-22 MED ORDER — OMEPRAZOLE 20 MG PO CPDR: 20.0000 mg | DELAYED_RELEASE_CAPSULE | Freq: Every day | ORAL | 3 refills | Status: DC

## 2018-06-30 ENCOUNTER — Other Ambulatory Visit: Payer: Self-pay | Admitting: Internal Medicine

## 2018-06-30 DIAGNOSIS — G8929 Other chronic pain: Principal | ICD-10-CM

## 2018-06-30 DIAGNOSIS — M542 Cervicalgia: Secondary | ICD-10-CM

## 2018-06-30 DIAGNOSIS — M549 Dorsalgia, unspecified: Principal | ICD-10-CM

## 2018-06-30 MED ORDER — MELOXICAM 7.5 MG PO TABS: 7.5000 mg | ORAL_TABLET | Freq: Two times a day (BID) | ORAL | 1 refills | Status: DC | PRN

## 2018-07-01 ENCOUNTER — Other Ambulatory Visit: Payer: Self-pay | Admitting: Obstetrics and Gynecology

## 2018-07-01 NOTE — Telephone Encounter (Signed)
Please advise 

## 2018-07-03 ENCOUNTER — Other Ambulatory Visit: Payer: Self-pay | Admitting: Obstetrics and Gynecology

## 2018-07-06 ENCOUNTER — Ambulatory Visit
Admission: RE | Admit: 2018-07-06 | Discharge: 2018-07-06 | Disposition: A | Discharge status: Home / Self Care | Payer: Medicare Other | Source: Ambulatory Visit | Attending: Internal Medicine | Admitting: Internal Medicine

## 2018-07-06 DIAGNOSIS — Z1231 Encounter for screening mammogram for malignant neoplasm of breast: Secondary | ICD-10-CM | POA: Diagnosis not present

## 2018-07-06 MED ORDER — VALACYCLOVIR HCL 500 MG PO TABS: 500.0000 mg | ORAL_TABLET | Freq: Every day | ORAL | 0 refills | Status: DC

## 2018-07-06 NOTE — Telephone Encounter (Signed)
Advise

## 2018-07-14 ENCOUNTER — Encounter: Payer: Self-pay | Admitting: Internal Medicine

## 2018-07-15 ENCOUNTER — Other Ambulatory Visit: Payer: Self-pay | Admitting: Internal Medicine

## 2018-07-15 DIAGNOSIS — M542 Cervicalgia: Secondary | ICD-10-CM

## 2018-07-15 DIAGNOSIS — G8929 Other chronic pain: Principal | ICD-10-CM

## 2018-07-15 DIAGNOSIS — M549 Dorsalgia, unspecified: Principal | ICD-10-CM

## 2018-07-15 MED ORDER — MELOXICAM 15 MG PO TABS: 15.0000 mg | ORAL_TABLET | Freq: Every day | ORAL | 1 refills | Status: DC | PRN

## 2018-08-06 DIAGNOSIS — L821 Other seborrheic keratosis: Secondary | ICD-10-CM | POA: Diagnosis not present

## 2018-08-20 ENCOUNTER — Encounter: Payer: Self-pay | Admitting: Internal Medicine

## 2018-08-23 ENCOUNTER — Other Ambulatory Visit: Payer: Self-pay | Admitting: Internal Medicine

## 2018-08-23 DIAGNOSIS — K219 Gastro-esophageal reflux disease without esophagitis: Secondary | ICD-10-CM

## 2018-08-23 MED ORDER — OMEPRAZOLE 40 MG PO CPDR: 40.0000 mg | DELAYED_RELEASE_CAPSULE | Freq: Every day | ORAL | 3 refills | Status: DC

## 2018-09-20 ENCOUNTER — Other Ambulatory Visit: Payer: Self-pay | Admitting: Obstetrics and Gynecology

## 2018-09-21 NOTE — Telephone Encounter (Signed)
Please advise 

## 2018-09-24 ENCOUNTER — Other Ambulatory Visit: Payer: Self-pay | Admitting: Obstetrics and Gynecology

## 2018-09-24 MED ORDER — VALACYCLOVIR HCL 500 MG PO TABS: 500.0000 mg | ORAL_TABLET | Freq: Every day | ORAL | 0 refills | Status: DC

## 2018-11-28 ENCOUNTER — Encounter: Payer: Self-pay | Admitting: Internal Medicine

## 2018-11-30 ENCOUNTER — Other Ambulatory Visit: Payer: Self-pay | Admitting: Internal Medicine

## 2018-11-30 DIAGNOSIS — F3342 Major depressive disorder, recurrent, in full remission: Secondary | ICD-10-CM

## 2018-11-30 MED ORDER — PAROXETINE HCL 20 MG PO TABS: 20.0000 mg | ORAL_TABLET | Freq: Every day | ORAL | 3 refills | Status: DC

## 2018-12-04 DIAGNOSIS — H35373 Puckering of macula, bilateral: Secondary | ICD-10-CM | POA: Diagnosis not present

## 2018-12-21 ENCOUNTER — Other Ambulatory Visit: Payer: Self-pay | Admitting: Obstetrics and Gynecology

## 2019-03-18 ENCOUNTER — Other Ambulatory Visit: Payer: Self-pay | Admitting: Obstetrics and Gynecology

## 2019-03-18 NOTE — Telephone Encounter (Signed)
Please advise 

## 2019-03-30 ENCOUNTER — Other Ambulatory Visit: Payer: Self-pay | Admitting: Obstetrics and Gynecology

## 2019-03-30 DIAGNOSIS — N952 Postmenopausal atrophic vaginitis: Secondary | ICD-10-CM

## 2019-03-31 NOTE — Telephone Encounter (Signed)
Please advise 

## 2019-04-06 ENCOUNTER — Other Ambulatory Visit: Payer: Self-pay | Admitting: Obstetrics and Gynecology

## 2019-04-06 ENCOUNTER — Other Ambulatory Visit: Payer: Self-pay

## 2019-04-06 ENCOUNTER — Ambulatory Visit (INDEPENDENT_AMBULATORY_CARE_PROVIDER_SITE_OTHER): Payer: Medicare Other | Admitting: Internal Medicine

## 2019-04-06 VITALS — Ht 63.0 in | Wt 123.6 lb

## 2019-04-06 DIAGNOSIS — E785 Hyperlipidemia, unspecified: Secondary | ICD-10-CM | POA: Diagnosis not present

## 2019-04-06 DIAGNOSIS — Z1389 Encounter for screening for other disorder: Secondary | ICD-10-CM | POA: Diagnosis not present

## 2019-04-06 DIAGNOSIS — Z0184 Encounter for antibody response examination: Secondary | ICD-10-CM | POA: Diagnosis not present

## 2019-04-06 DIAGNOSIS — E2839 Other primary ovarian failure: Secondary | ICD-10-CM

## 2019-04-06 DIAGNOSIS — M8580 Other specified disorders of bone density and structure, unspecified site: Secondary | ICD-10-CM

## 2019-04-06 DIAGNOSIS — E039 Hypothyroidism, unspecified: Secondary | ICD-10-CM

## 2019-04-06 DIAGNOSIS — Z1159 Encounter for screening for other viral diseases: Secondary | ICD-10-CM | POA: Diagnosis not present

## 2019-04-06 DIAGNOSIS — Z0000 Encounter for general adult medical examination without abnormal findings: Secondary | ICD-10-CM

## 2019-04-06 DIAGNOSIS — Z1231 Encounter for screening mammogram for malignant neoplasm of breast: Secondary | ICD-10-CM | POA: Diagnosis not present

## 2019-04-06 DIAGNOSIS — Z13220 Encounter for screening for lipoid disorders: Secondary | ICD-10-CM

## 2019-04-06 NOTE — Progress Notes (Signed)
Virtual Visit via Video Note  I connected with [employer]   on 09/60/45 at  8:10 AM EST by a video enabled telemedicine application and verified that I am speaking with the correct person using two identifiers.  Location patient: home Location provider:work or home office Persons participating in the virtual visit: patient, provider  I discussed the limitations of evaluation and management by telemedicine and the availability of in person appointments. The patient expressed understanding and agreed to proceed.   HPI: 1. Hypothyroidism on levo 88 mcg doing well  2. HLD controlled on mevacor 40 mg qd  3. No complaints today h/o osteopenia dexa due will do with mammo 07/07/19    ROS: See pertinent positives and negatives per HPI.  Past Medical History:  Diagnosis Date  . Allergy   . Anxiety   . Arthritis   . BRCA negative 2016   MyRisk neg   . Depression   . Family history of ovarian cancer 2016   MyRisk neg; affected sister is BRCA neg  . GERD (gastroesophageal reflux disease)   . Herpes simplex   . High cholesterol   . Hypercholesteremia   . Hyperlipidemia   . Hypothyroidism   . Osteopenia    DEXA 09/02/13   . Rosacea   . Ruptured disc, cervical   . Thyroid disease   . Vertigo    per pt cervicogenic Physical therapy helped   . Vitamin D deficiency   . Wears dentures    full upper    Past Surgical History:  Procedure Laterality Date  . CERVICAL BIOPSY  W/ LOOP ELECTRODE EXCISION    . COLONOSCOPY    . COLONOSCOPY WITH PROPOFOL N/A 03/30/2018   Procedure: COLONOSCOPY WITH Biopsies;  Surgeon: [name], MD;  Location: [facility];  Service: Endoscopy;  Laterality: N/A;  . DILATION AND CURETTAGE OF UTERUS  30 years ago  . EYE SURGERY     b/l cataract   . POLYPECTOMY N/A 03/30/2018   Procedure: POLYPECTOMY;  Surgeon: [name], MD;  Location: [facility];  Service: Endoscopy;  Laterality: N/A;    Family History  Problem Relation Age of Onset   . Cancer Mother        kidney met to lungs   . Diabetes Mother   . Thyroid disease Mother   . Hypertension Father   . Stroke Father   . Thyroid disease Sister   . Ovarian cancer Sister 3       BRCA neg  . Diabetes Daughter        type 1   . Colon cancer Maternal Aunt 73  . Heart disease Other   . Breast cancer Neg Hx     SOCIAL HX:  Married  Retired    Current Outpatient Medications:  .  aspirin 81 MG tablet, Take 81 mg by mouth daily., Disp: , Rfl:  .  BIOTIN PO, Take 1 tablet by mouth daily., Disp: , Rfl:  .  Cholecalciferol 1000 units tablet, Take 1 tablet (1,000 Units total) by mouth daily., Disp: , Rfl:  .  estradiol (ESTRACE) 0.1 MG/GM vaginal cream, APPLY 1 GRAM BY VAGINAL ROUTE ONCE WEEKLY, Disp: 42.5 g, Rfl: 0 .  levothyroxine (SYNTHROID, LEVOTHROID) 88 MCG tablet, Take 1 tablet (88 mcg total) by mouth daily before breakfast. Empty stomach, Disp: 90 tablet, Rfl: 3 .  lovastatin (MEVACOR) 40 MG tablet, Take 1 tablet (40 mg total) by mouth at bedtime., Disp: 90 tablet, Rfl: 3 .  magnesium  chloride (SLOW-MAG) 64 MG TBEC SR tablet, Take 1 tablet by mouth., Disp: , Rfl:  .  Meclizine HCl (ANTIVERT PO), Take by mouth as needed., Disp: , Rfl:  .  meloxicam (MOBIC) 15 MG tablet, Take 1 tablet (15 mg total) by mouth daily as needed for pain., Disp: 90 tablet, Rfl: 1 .  Multiple Vitamins-Minerals (MULTIVITAMIN ADULT PO), , Disp: , Rfl:  .  Omega-3 Fatty Acids (FISH OIL) 1200 MG CAPS, , Disp: , Rfl:  .  omeprazole (PRILOSEC) 40 MG capsule, Take 1 capsule (40 mg total) by mouth daily. Wait 3-4 hours after thyroid med. Take 30 minutes before food lunch/dinner, Disp: 90 capsule, Rfl: 3 .  PARoxetine (PAXIL) 20 MG tablet, Take 1 tablet (20 mg total) by mouth daily., Disp: 90 tablet, Rfl: 3 .  polyethylene glycol powder (GLYCOLAX/MIRALAX) powder, Take 17 g by mouth daily as needed., Disp: 850 g, Rfl: 11 .  valACYclovir (VALTREX) 500 MG tablet, TAKE 1 TABLET BY MOUTH EVERY DAY, Disp:  90 tablet, Rfl: 0 .  vitamin B-12 (CYANOCOBALAMIN) 500 MCG tablet, Take 500 mcg by mouth daily., Disp: , Rfl:   EXAM:  VITALS per patient if applicable:  GENERAL: alert, oriented, appears well and in no acute distress  HEENT: atraumatic, conjunttiva clear, no obvious abnormalities on inspection of external nose and ears  NECK: normal movements of the head and neck  LUNGS: on inspection no signs of respiratory distress, breathing rate appears normal, no obvious gross SOB, gasping or wheezing  CV: no obvious cyanosis  MS: moves all visible extremities without noticeable abnormality  PSYCH/NEURO: pleasant and cooperative, no obvious depression or anxiety, speech and thought processing grossly intact  ASSESSMENT AND PLAN:  Discussed the following assessment and plan:  Hypothyroidism, unspecified type - Plan: Comprehensive metabolic panel, CBC with Differential/Platelet, TSH Cont same dose meds   Osteopenia, unspecified location - Plan: DG Bone Density, Comprehensive metabolic panel, CBC with Differential/Platelet  Hyperlipidemia, unspecified hyperlipidemia type - Plan: Lipid panel Cont mevacor 40 mg qhs   HM Flu shot due 2020  utd prevnar and pna 23 utd  Allergic to Tdap Had 3/3 hep B vaccines check titer  Had zostavax but disc shingrixgiven Rx today   mammo2/3/20negreferred another   Last pap [facility]2017 will get records at f/u of note had genetic testing ovarian cancer with OB/GYN neg -pap 03/23/15 neg pap HPV neg Dr. Laurey Morale -appt [facility] [id number] OB/GYN  colonoscopy 03/30/18 2 4-6 mm polpys diverticulosis tubular and sessile serrated Dr. Allen Norris f/u in 3-5 years   DEXA had 09/02/13 osteopeniarepeat DEXA ordered on vit D3 1000 mg qdrec repeat ordered [facility]   Dermatology appt due 05/2019 [facility]. Normal exam 05/2018 exam  Never smoker, no etoh, drugs  -we discussed possible serious and likely etiologies, options for  evaluation and workup, limitations of telemedicine visit vs in person visit, treatment, treatment risks and precautions. Pt prefers to treat via telemedicine empirically rather then risking or undertaking an in person visit at this moment. Patient agrees to seek prompt in person care if worsening, new symptoms arise, or if is not improving with treatment.   I discussed the assessment and treatment plan with the patient. The patient was provided an opportunity to ask questions and all were answered. The patient agreed with the plan and demonstrated an understanding of the instructions.   The patient was advised to call back or seek an in-person evaluation if the symptoms worsen or if the condition fails to  improve as anticipated.  Time spent 15 minutes Delorise Jackson, MD

## 2019-04-12 ENCOUNTER — Encounter: Payer: Self-pay | Admitting: Internal Medicine

## 2019-04-28 ENCOUNTER — Other Ambulatory Visit: Payer: Self-pay

## 2019-04-28 DIAGNOSIS — Z20828 Contact with and (suspected) exposure to other viral communicable diseases: Secondary | ICD-10-CM | POA: Diagnosis not present

## 2019-04-28 DIAGNOSIS — Z20822 Contact with and (suspected) exposure to covid-19: Secondary | ICD-10-CM

## 2019-04-30 LAB — NOVEL CORONAVIRUS, NAA: SARS-CoV-2, NAA: NOT DETECTED

## 2019-05-03 ENCOUNTER — Ambulatory Visit: Payer: Medicare Other | Admitting: Obstetrics and Gynecology

## 2019-05-03 ENCOUNTER — Other Ambulatory Visit: Payer: Self-pay | Admitting: Obstetrics and Gynecology

## 2019-05-03 DIAGNOSIS — N952 Postmenopausal atrophic vaginitis: Secondary | ICD-10-CM

## 2019-05-04 ENCOUNTER — Other Ambulatory Visit: Payer: Self-pay | Admitting: Internal Medicine

## 2019-05-04 ENCOUNTER — Telehealth: Payer: Self-pay | Admitting: Internal Medicine

## 2019-05-04 DIAGNOSIS — G8929 Other chronic pain: Secondary | ICD-10-CM

## 2019-05-04 MED ORDER — LOVASTATIN 40 MG PO TABS: 40.0000 mg | ORAL_TABLET | Freq: Every day | ORAL | 3 refills | Status: DC

## 2019-05-04 MED ORDER — MELOXICAM 15 MG PO TABS: 15.0000 mg | ORAL_TABLET | Freq: Every day | ORAL | 1 refills | Status: DC | PRN

## 2019-05-04 MED ORDER — LEVOTHYROXINE SODIUM 88 MCG PO TABS: 88.0000 ug | ORAL_TABLET | Freq: Every day | ORAL | 3 refills | Status: DC

## 2019-05-04 NOTE — Telephone Encounter (Signed)
Call pt and sch fasting labs 21 days after 04/28/19   thanks [place]

## 2019-05-16 ENCOUNTER — Other Ambulatory Visit: Payer: Self-pay | Admitting: Obstetrics and Gynecology

## 2019-05-16 DIAGNOSIS — N952 Postmenopausal atrophic vaginitis: Secondary | ICD-10-CM

## 2019-05-17 ENCOUNTER — Ambulatory Visit: Payer: Medicare Other | Admitting: Obstetrics and Gynecology

## 2019-05-17 ENCOUNTER — Other Ambulatory Visit: Payer: Self-pay | Admitting: Internal Medicine

## 2019-05-17 ENCOUNTER — Other Ambulatory Visit: Payer: Self-pay | Admitting: Obstetrics and Gynecology

## 2019-05-17 ENCOUNTER — Telehealth: Payer: Self-pay | Admitting: *Deleted

## 2019-05-17 DIAGNOSIS — D649 Anemia, unspecified: Secondary | ICD-10-CM

## 2019-05-17 DIAGNOSIS — Z0000 Encounter for general adult medical examination without abnormal findings: Secondary | ICD-10-CM

## 2019-05-17 DIAGNOSIS — Z0184 Encounter for antibody response examination: Secondary | ICD-10-CM

## 2019-05-17 DIAGNOSIS — E785 Hyperlipidemia, unspecified: Secondary | ICD-10-CM

## 2019-05-17 DIAGNOSIS — Z1159 Encounter for screening for other viral diseases: Secondary | ICD-10-CM

## 2019-05-17 DIAGNOSIS — E559 Vitamin D deficiency, unspecified: Secondary | ICD-10-CM

## 2019-05-17 DIAGNOSIS — E039 Hypothyroidism, unspecified: Secondary | ICD-10-CM

## 2019-05-17 DIAGNOSIS — Z1389 Encounter for screening for other disorder: Secondary | ICD-10-CM

## 2019-05-17 MED ORDER — VALACYCLOVIR HCL 500 MG PO TABS: 500.0000 mg | ORAL_TABLET | Freq: Every day | ORAL | 0 refills | Status: DC

## 2019-05-17 NOTE — Telephone Encounter (Signed)
I had ordered them for [facility] so she was supposed to go there    so I also ordered them for our clinic now that you are telling me she is coming here    Again if pt was told to go to [facility] this is where they need to go to prevent confusion and this needs to be asked by front desk please before scheduling  -were they instructed to go to [place]?   [place]

## 2019-05-17 NOTE — Telephone Encounter (Signed)
Please place future orders for lab appt.  

## 2019-05-17 NOTE — Telephone Encounter (Signed)
Annual 1/12

## 2019-05-17 NOTE — Progress Notes (Signed)
Rx RF valtrex till 1/21 annual.

## 2019-05-19 ENCOUNTER — Other Ambulatory Visit: Payer: Medicare Other

## 2019-06-02 ENCOUNTER — Other Ambulatory Visit: Payer: Medicare Other

## 2019-06-03 ENCOUNTER — Other Ambulatory Visit: Payer: Self-pay | Admitting: Internal Medicine

## 2019-06-03 ENCOUNTER — Encounter: Payer: Self-pay | Admitting: Internal Medicine

## 2019-06-03 NOTE — Addendum Note (Signed)
Addended by: Orland Mustard on: 06/03/2019 01:13 PM   Modules accepted: Orders

## 2019-06-08 LAB — LIPID PANEL
Chol/HDL Ratio: 2.4 ratio (ref 0.0–4.4)
Cholesterol, Total: 183 mg/dL (ref 100–199)
HDL: 77 mg/dL (ref >39)
LDL Chol Calc (NIH): 91 mg/dL (ref 0–99)
Triglycerides: 85 mg/dL (ref 0–149)
VLDL Cholesterol Cal: 15 mg/dL (ref 5–40)

## 2019-06-08 LAB — CBC WITH DIFFERENTIAL/PLATELET
Basophils Absolute: 0 10*3/uL (ref 0.0–0.2)
Basos: 1 %
EOS (ABSOLUTE): 0.2 10*3/uL (ref 0.0–0.4)
Eos: 4 %
Hematocrit: 39.6 % (ref 34.0–46.6)
Hemoglobin: 13.1 g/dL (ref 11.1–15.9)
Immature Grans (Abs): 0 10*3/uL (ref 0.0–0.1)
Immature Granulocytes: 1 %
Lymphocytes Absolute: 1.3 10*3/uL (ref 0.7–3.1)
Lymphs: 31 %
MCH: 32.4 pg (ref 26.6–33.0)
MCHC: 33.1 g/dL (ref 31.5–35.7)
MCV: 98 fL — ABNORMAL HIGH (ref 79–97)
Monocytes Absolute: 0.3 10*3/uL (ref 0.1–0.9)
Monocytes: 8 %
Neutrophils Absolute: 2.3 10*3/uL (ref 1.4–7.0)
Neutrophils: 55 %
Platelets: 230 10*3/uL (ref 150–450)
RBC: 4.04 x10E6/uL (ref 3.77–5.28)
RDW: 11.8 % (ref 11.7–15.4)
WBC: 4.1 10*3/uL (ref 3.4–10.8)

## 2019-06-08 LAB — URINALYSIS, ROUTINE W REFLEX MICROSCOPIC
Bilirubin, UA: NEGATIVE
Glucose, UA: NEGATIVE
Ketones, UA: NEGATIVE
Leukocytes,UA: NEGATIVE
Nitrite, UA: NEGATIVE
Protein,UA: NEGATIVE
RBC, UA: NEGATIVE
Specific Gravity, UA: 1.01 (ref 1.005–1.030)
Urobilinogen, Ur: 0.2 mg/dL (ref 0.2–1.0)
pH, UA: 5.5 (ref 5.0–7.5)

## 2019-06-08 LAB — COMPREHENSIVE METABOLIC PANEL
ALT: 37 IU/L — ABNORMAL HIGH (ref 0–32)
AST: 31 IU/L (ref 0–40)
Albumin/Globulin Ratio: 2.1 (ref 1.2–2.2)
Albumin: 4.6 g/dL (ref 3.8–4.8)
Alkaline Phosphatase: 58 IU/L (ref 39–117)
BUN/Creatinine Ratio: 12 (ref 12–28)
BUN: 11 mg/dL (ref 8–27)
Bilirubin Total: 0.5 mg/dL (ref 0.0–1.2)
CO2: 22 mmol/L (ref 20–29)
Calcium: 9.5 mg/dL (ref 8.7–10.3)
Chloride: 103 mmol/L (ref 96–106)
Creatinine, Ser: 0.93 mg/dL (ref 0.57–1.00)
GFR calc Af Amer: 73 mL/min/{1.73_m2} (ref >59)
GFR calc non Af Amer: 63 mL/min/{1.73_m2} (ref >59)
Globulin, Total: 2.2 g/dL (ref 1.5–4.5)
Glucose: 82 mg/dL (ref 65–99)
Potassium: 4.1 mmol/L (ref 3.5–5.2)
Sodium: 141 mmol/L (ref 134–144)
Total Protein: 6.8 g/dL (ref 6.0–8.5)

## 2019-06-08 LAB — HEPATITIS B SURFACE ANTIBODY, QUANTITATIVE: Hepatitis B Surf Ab Quant: <3.1 m[IU]/mL — ABNORMAL LOW (ref >9.9)

## 2019-06-08 LAB — TSH: TSH: 2.72 u[IU]/mL (ref 0.450–4.500)

## 2019-06-10 ENCOUNTER — Other Ambulatory Visit: Payer: Self-pay | Admitting: Internal Medicine

## 2019-06-10 ENCOUNTER — Encounter: Payer: Self-pay | Admitting: Internal Medicine

## 2019-06-10 DIAGNOSIS — R748 Abnormal levels of other serum enzymes: Secondary | ICD-10-CM

## 2019-06-10 DIAGNOSIS — R21 Rash and other nonspecific skin eruption: Secondary | ICD-10-CM

## 2019-06-14 ENCOUNTER — Other Ambulatory Visit: Payer: Self-pay

## 2019-06-14 ENCOUNTER — Ambulatory Visit
Admission: RE | Admit: 2019-06-14 | Discharge: 2019-06-14 | Disposition: A | Discharge status: Home / Self Care | Payer: Medicare Other | Source: Ambulatory Visit | Attending: Internal Medicine | Admitting: Internal Medicine

## 2019-06-14 DIAGNOSIS — R748 Abnormal levels of other serum enzymes: Secondary | ICD-10-CM | POA: Diagnosis not present

## 2019-06-14 DIAGNOSIS — R21 Rash and other nonspecific skin eruption: Secondary | ICD-10-CM | POA: Insufficient documentation

## 2019-06-15 ENCOUNTER — Encounter: Payer: Self-pay | Admitting: Obstetrics and Gynecology

## 2019-06-15 ENCOUNTER — Other Ambulatory Visit (HOSPITAL_COMMUNITY)
Admission: RE | Admit: 2019-06-15 | Discharge: 2019-06-15 | Disposition: A | Discharge status: Home / Self Care | Payer: Medicare Other | Source: Ambulatory Visit | Attending: Obstetrics and Gynecology | Admitting: Obstetrics and Gynecology

## 2019-06-15 ENCOUNTER — Ambulatory Visit (INDEPENDENT_AMBULATORY_CARE_PROVIDER_SITE_OTHER): Payer: Medicare Other | Admitting: Obstetrics and Gynecology

## 2019-06-15 VITALS — BP 100/70 | Ht 63.0 in | Wt 123.0 lb

## 2019-06-15 DIAGNOSIS — Z124 Encounter for screening for malignant neoplasm of cervix: Secondary | ICD-10-CM | POA: Diagnosis not present

## 2019-06-15 DIAGNOSIS — Z1231 Encounter for screening mammogram for malignant neoplasm of breast: Secondary | ICD-10-CM

## 2019-06-15 DIAGNOSIS — Z01419 Encounter for gynecological examination (general) (routine) without abnormal findings: Secondary | ICD-10-CM

## 2019-06-15 DIAGNOSIS — A6004 Herpesviral vulvovaginitis: Secondary | ICD-10-CM

## 2019-06-15 DIAGNOSIS — N952 Postmenopausal atrophic vaginitis: Secondary | ICD-10-CM

## 2019-06-15 DIAGNOSIS — Z23 Encounter for immunization: Secondary | ICD-10-CM | POA: Diagnosis not present

## 2019-06-15 MED ORDER — ESTRADIOL 0.1 MG/GM VA CREA: TOPICAL_CREAM | VAGINAL | 1 refills | Status: DC

## 2019-06-15 MED ORDER — VALACYCLOVIR HCL 500 MG PO TABS: ORAL_TABLET | ORAL | 3 refills | Status: DC

## 2019-06-15 NOTE — Addendum Note (Signed)
Addended by: Drenda Freeze on: 06/15/2019 11:34 AM   Modules accepted: Orders

## 2019-06-15 NOTE — Patient Instructions (Signed)
I value your feedback and entrusting us with your care. If you get a [facility] patient survey, I would appreciate you taking the time to let us know about your experience today. Thank you!  As of May 13, 2019, your lab results will be released to your MyChart immediately, before I even have a chance to see them. Please give me time to review them and contact you if there are any abnormalities. Thank you for your patience.  

## 2019-06-15 NOTE — Progress Notes (Signed)
PCP: McLean-Scocuzza, Sandra Glow, MD   Chief Complaint  Patient presents with  . Gynecologic Exam    rash on both legs x 1 week    HPI:      [name] is a 69 y.o. [id number] who LMP was No LMP recorded. Patient is postmenopausal., presents today for her medicare annual examination.  Her menses are absent due to menopause. She does not have intermenstrual bleeding.S/p thickened EM on u/s in past with neg EMB/SHGM. She does not have vasomotor sx.   Sex activity: single partner, contraception - post menopausal status. She does have vaginal dryness--sx relieved with estrace vag crm and lubricants. Needs Rx RF.  Last Pap: March 23, 2015  Results were: no abnormalities /neg HPV DNA.  Hx of STDs: HSV, takes valtrex daily. Gets sx about Q3 months, needs Rx RF.  Last mammogram: 07/06/18 with PCP, Results were: normal--routine follow-up in 12 months There is no FH of breast cancer. There is a FH of ovarian cancer in her sister. Her sister is BRCA neg and pt is MyRisk neg 2016. The patient does not do self-breast exams. Pt had neg GYN u/s for ovaries and neg ca-125 in past;  this screening no longer indicated/recommended.  Colonoscopy: 10/19 with Dr. Allen Norris; Repeat due after 5 years DEXA: with PCP, will do with mammo this yr.  Tobacco use: The patient denies current or previous tobacco use. Alcohol use: none  No drug use Exercise: very active with grandkids, no formal exercise though  She does get adequate calcium and Vitamin D in her diet.  Labs with PCP.   Has non-itchy, petechial rash on bilat legs and abd. Had neg labs with PCP, except mild elevated ALT. Had neg liver u/s. Has upcoming derm appt.  Past Medical History:  Diagnosis Date  . Allergy   . Anxiety   . Arthritis   . BRCA negative 2016   MyRisk neg   . Depression   . Family history of ovarian cancer 2016   MyRisk neg; affected sister is BRCA neg  . GERD (gastroesophageal reflux disease)   . Herpes simplex   . High  cholesterol   . Hypercholesteremia   . Hyperlipidemia   . Hypothyroidism   . Osteopenia    DEXA 09/02/13   . Rosacea   . Ruptured disc, cervical   . Thyroid disease   . Vertigo    per pt cervicogenic Physical therapy helped   . Vitamin D deficiency   . Wears dentures    full upper    Past Surgical History:  Procedure Laterality Date  . CERVICAL BIOPSY  W/ LOOP ELECTRODE EXCISION    . COLONOSCOPY    . COLONOSCOPY WITH PROPOFOL N/A 03/30/2018   Procedure: COLONOSCOPY WITH Biopsies;  Surgeon: [name], MD;  Location: [facility];  Service: Endoscopy;  Laterality: N/A;  . DILATION AND CURETTAGE OF UTERUS  30 years ago  . EYE SURGERY     b/l cataract   . POLYPECTOMY N/A 03/30/2018   Procedure: POLYPECTOMY;  Surgeon: [name], MD;  Location: [facility];  Service: Endoscopy;  Laterality: N/A;    Family History  Problem Relation Age of Onset  . Cancer Mother        kidney met to lungs   . Diabetes Mother   . Thyroid disease Mother   . Hypertension Father   . Stroke Father   . Thyroid disease Sister   . Ovarian cancer Sister 30  BRCA neg  . Diabetes Daughter        type 1   . Colon cancer Maternal Aunt 37  . Heart disease Other   . Breast cancer Neg Hx     Social History   Socioeconomic History  . Marital status: Married    Spouse name: Not on file  . Number of children: Not on file  . Years of education: Not on file  . Highest education level: Not on file  Occupational History  . Not on file  Tobacco Use  . Smoking status: Never Smoker  . Smokeless tobacco: Never Used  Substance and Sexual Activity  . Alcohol use: No    Alcohol/week: 0.0 standard drinks  . Drug use: No  . Sexual activity: Yes    Birth control/protection: Post-menopausal  Other Topics Concern  . Not on file  Social History Narrative    [facility] retired as of 04/02/18    Social Determinants of Health   Financial Resource Strain:   . Difficulty of  Paying Living Expenses: Not on file  Food Insecurity:   . Worried About Charity fundraiser in the Last Year: Not on file  . Ran Out of Food in the Last Year: Not on file  Transportation Needs:   . Lack of Transportation (Medical): Not on file  . Lack of Transportation (Non-Medical): Not on file  Physical Activity:   . Days of Exercise per Week: Not on file  . Minutes of Exercise per Session: Not on file  Stress:   . Feeling of Stress : Not on file  Social Connections:   . Frequency of Communication with Friends and Family: Not on file  . Frequency of Social Gatherings with Friends and Family: Not on file  . Attends Religious Services: Not on file  . Active Member of Clubs or Organizations: Not on file  . Attends Archivist Meetings: Not on file  . Marital Status: Not on file  Intimate Partner Violence:   . Fear of Current or Ex-Partner: Not on file  . Emotionally Abused: Not on file  . Physically Abused: Not on file  . Sexually Abused: Not on file    Current Meds  Medication Sig  . Ascorbic Acid (VITAMIN C) 500 MG CHEW   . aspirin 81 MG tablet Take 81 mg by mouth daily.  Marland Kitchen BIOTIN PO Take 1 tablet by mouth daily.  . Cholecalciferol 1000 units tablet Take 1 tablet (1,000 Units total) by mouth daily.  Marland Kitchen estradiol (ESTRACE) 0.1 MG/GM vaginal cream Insert 1 g vaginally once weekly  . levothyroxine (SYNTHROID) 88 MCG tablet Take 1 tablet (88 mcg total) by mouth daily before breakfast. Empty stomach  . lovastatin (MEVACOR) 40 MG tablet Take 1 tablet (40 mg total) by mouth at bedtime.  . magnesium chloride (SLOW-MAG) 64 MG TBEC SR tablet Take 1 tablet by mouth.  . Meclizine HCl (ANTIVERT PO) Take by mouth as needed.  . meloxicam (MOBIC) 15 MG tablet Take 1 tablet (15 mg total) by mouth daily as needed for pain.  . Multiple Vitamins-Minerals (MULTIVITAMIN ADULT PO)   . omeprazole (PRILOSEC) 40 MG capsule Take 1 capsule (40 mg total) by mouth daily. Wait 3-4 hours after  thyroid med. Take 30 minutes before food lunch/dinner  . PARoxetine (PAXIL) 20 MG tablet Take 1 tablet (20 mg total) by mouth daily.  . polyethylene glycol powder (GLYCOLAX/MIRALAX) powder Take 17 g by mouth daily as needed.  . valACYclovir (VALTREX)  500 MG tablet Take 1 tab daily as preventive or BID for 3 days prn sx flare  . vitamin B-12 (CYANOCOBALAMIN) 500 MCG tablet Take 500 mcg by mouth daily.  . [DISCONTINUED] estradiol (ESTRACE) 0.1 MG/GM vaginal cream APPLY 1 GRAM BY VAGINAL ROUTE ONCE WEEKLY  . [DISCONTINUED] valACYclovir (VALTREX) 500 MG tablet Take 1 tablet (500 mg total) by mouth daily.      ROS:  Review of Systems  Constitutional: Negative for fatigue, fever and unexpected weight change.  Respiratory: Negative for cough, shortness of breath and wheezing.   Cardiovascular: Negative for chest pain, palpitations and leg swelling.  Gastrointestinal: Negative for blood in stool, constipation, diarrhea, nausea and vomiting.  Endocrine: Negative for cold intolerance, heat intolerance and polyuria.  Genitourinary: Negative for dyspareunia, dysuria, flank pain, frequency, genital sores, hematuria, menstrual problem, pelvic pain, urgency, vaginal bleeding, vaginal discharge and vaginal pain.  Musculoskeletal: Positive for arthralgias. Negative for back pain, joint swelling and myalgias.  Skin: Positive for rash.  Neurological: Negative for dizziness, syncope, light-headedness, numbness and headaches.  Hematological: Negative for adenopathy.  Psychiatric/Behavioral: Negative for agitation, confusion, sleep disturbance and suicidal ideas. The patient is not nervous/anxious.      Objective: BP 100/70   Ht '5\' 3"'$  (1.6 m)   Wt 123 lb (55.8 kg)   BMI 21.79 kg/m    Physical Exam Constitutional:      Appearance: She is well-developed.  Genitourinary:     Vagina and uterus normal.     No vaginal discharge, erythema or tenderness.     No cervical motion tenderness or polyp.      Uterus is not enlarged or tender.     No right or left adnexal mass present.     Right adnexa not tender.     Left adnexa not tender.  Neck:     Thyroid: No thyromegaly.  Cardiovascular:     Rate and Rhythm: Normal rate and regular rhythm.     Heart sounds: Normal heart sounds. No murmur.  Pulmonary:     Effort: Pulmonary effort is normal.     Breath sounds: Normal breath sounds.  Chest:     Breasts:        Right: No mass, nipple discharge, skin change or tenderness.        Left: No mass, nipple discharge, skin change or tenderness.  Abdominal:     Palpations: Abdomen is soft.     Tenderness: There is no abdominal tenderness. There is no guarding.  Musculoskeletal:        General: Normal range of motion.     Cervical back: Normal range of motion.  Neurological:     Mental Status: She is alert and oriented to person, place, and time.     Cranial Nerves: No cranial nerve deficit.  Psychiatric:        Behavior: Behavior normal.  Vitals reviewed.     Assessment/Plan:  Encounter for annual routine gynecological examination  Cervical cancer screening - Plan: [facility] PAP  Encounter for screening mammogram for malignant neoplasm of breast; pt does through PCP order. Will do DEXA with mammo.  Postmenopausal atrophic vaginitis - Sx improved wtih estrace crm. Rx RF.  - Plan: estradiol (ESTRACE) 0.1 MG/GM vaginal cream  Herpes simplex vulvovaginitis - Plan: valACYclovir (VALTREX) 500 MG tablet; Rx RF. Takes daily.    Meds ordered this encounter  Medications  . estradiol (ESTRACE) 0.1 MG/GM vaginal cream    Sig: Insert 1 g vaginally once weekly  Dispense:  42.5 g    Refill:  1    Order Specific Question:   Supervising Provider    Answer:   Gae Dry [id number]  . valACYclovir (VALTREX) 500 MG tablet    Sig: Take 1 tab daily as preventive or BID for 3 days prn sx flare    Dispense:  90 tablet    Refill:  3    Order Specific Question:   Supervising Provider    Answer:    Gae Dry [[id number]]            GYN counsel mammography screening, menopause, adequate intake of calcium and vitamin D, diet and exercise    F/U  Return in about 2 years (around 06/14/2021) for annual. (Offered for pt to stay with PCP but she wants to come Q2 yrs. Will RF meds in meantime).  Shivonne Schwartzman B. Brandolyn Shortridge, PA-C 06/15/2019 11:30 AM

## 2019-06-16 LAB — CYTOLOGY - PAP
Adequacy: ABSENT
Diagnosis: NEGATIVE

## 2019-07-02 ENCOUNTER — Other Ambulatory Visit: Payer: Medicare Other

## 2019-07-06 ENCOUNTER — Other Ambulatory Visit: Payer: Self-pay | Admitting: Internal Medicine

## 2019-07-06 DIAGNOSIS — K219 Gastro-esophageal reflux disease without esophagitis: Secondary | ICD-10-CM

## 2019-07-06 MED ORDER — OMEPRAZOLE 40 MG PO CPDR: 40.0000 mg | DELAYED_RELEASE_CAPSULE | Freq: Every day | ORAL | 3 refills | Status: DC

## 2019-08-05 DIAGNOSIS — D2272 Melanocytic nevi of left lower limb, including hip: Secondary | ICD-10-CM | POA: Diagnosis not present

## 2019-08-05 DIAGNOSIS — D2271 Melanocytic nevi of right lower limb, including hip: Secondary | ICD-10-CM | POA: Diagnosis not present

## 2019-08-05 DIAGNOSIS — D2261 Melanocytic nevi of right upper limb, including shoulder: Secondary | ICD-10-CM | POA: Diagnosis not present

## 2019-08-05 DIAGNOSIS — D2262 Melanocytic nevi of left upper limb, including shoulder: Secondary | ICD-10-CM | POA: Diagnosis not present

## 2019-08-05 DIAGNOSIS — L821 Other seborrheic keratosis: Secondary | ICD-10-CM | POA: Diagnosis not present

## 2019-08-05 DIAGNOSIS — D225 Melanocytic nevi of trunk: Secondary | ICD-10-CM | POA: Diagnosis not present

## 2019-08-05 DIAGNOSIS — L309 Dermatitis, unspecified: Secondary | ICD-10-CM | POA: Diagnosis not present

## 2019-08-11 ENCOUNTER — Ambulatory Visit: Payer: Medicare Other

## 2019-08-11 ENCOUNTER — Inpatient Hospital Stay: Admission: RE | Admit: 2019-08-11 | Payer: Medicare Other | Source: Ambulatory Visit

## 2019-08-23 ENCOUNTER — Ambulatory Visit
Admission: RE | Admit: 2019-08-23 | Discharge: 2019-08-23 | Disposition: A | Discharge status: Home / Self Care | Payer: Medicare Other | Source: Ambulatory Visit | Attending: Internal Medicine | Admitting: Internal Medicine

## 2019-08-23 ENCOUNTER — Other Ambulatory Visit: Payer: Self-pay

## 2019-08-23 DIAGNOSIS — E2839 Other primary ovarian failure: Secondary | ICD-10-CM | POA: Insufficient documentation

## 2019-08-23 DIAGNOSIS — Z1231 Encounter for screening mammogram for malignant neoplasm of breast: Secondary | ICD-10-CM | POA: Diagnosis not present

## 2019-08-23 DIAGNOSIS — M85852 Other specified disorders of bone density and structure, left thigh: Secondary | ICD-10-CM | POA: Diagnosis not present

## 2019-08-23 DIAGNOSIS — M8580 Other specified disorders of bone density and structure, unspecified site: Secondary | ICD-10-CM

## 2019-08-23 DIAGNOSIS — Z780 Asymptomatic menopausal state: Secondary | ICD-10-CM | POA: Diagnosis not present

## 2019-09-14 ENCOUNTER — Telehealth: Payer: Self-pay | Admitting: Internal Medicine

## 2019-09-14 NOTE — Telephone Encounter (Signed)
"  Bone density osteopenia  Rec multivitamin daily with calcium and vitamin D and exercise"  Patient states she did receive the voicemail from earlier that was left. She only wanted to know how much calcium to take as she already takes a multivitamin and separate vitamin D.   Please advise

## 2019-09-14 NOTE — Telephone Encounter (Signed)
Pt returned your call but wasn't sure what it was concerning.

## 2019-09-15 ENCOUNTER — Ambulatory Visit (INDEPENDENT_AMBULATORY_CARE_PROVIDER_SITE_OTHER): Payer: Medicare Other

## 2019-09-15 VITALS — Ht 63.0 in | Wt 123.0 lb

## 2019-09-15 DIAGNOSIS — Z0000 Encounter for general adult medical examination without abnormal findings: Secondary | ICD-10-CM | POA: Diagnosis not present

## 2019-09-15 NOTE — Progress Notes (Addendum)
Subjective:   Sandra Horton is a 69 y.o. female who presents for Medicare Annual (Subsequent) preventive examination.  Review of Systems:  No ROS.  Medicare Wellness Virtual Visit.  Visual/audio telehealth visit, UTA vital signs.   Ht/Wt provided. See social history for additional risk factors.   Cardiac Risk Factors include: advanced age (>17mn, >>18women)     Objective:     Vitals: Ht '5\' 3"'$  (1.6 m)   Wt 123 lb (55.8 kg)   BMI 21.79 kg/m   Body mass index is 21.79 kg/m.  Advanced Directives 09/15/2019 03/30/2018 07/07/2016 07/07/2016 11/09/2015 05/11/2015 11/03/2014  Does Patient Have a Medical Advance Directive? Yes Yes Yes No Yes No Yes  Type of Advance Directive Living will;Healthcare Power of [place] Healthcare Power of [place]University [facility] [place]Living will - Living will;Healthcare Power of Attorney  Does patient want to make changes to medical advance directive? No - Patient declined No - Patient declined No - Patient declined - No - Patient declined - -  Copy of [place]? No - copy requested No - copy requested No - copy requested - No - copy requested - -  Would patient like information on creating a medical advance directive? - - No - Patient declined Yes (ED - Information included in AVS) No - patient declined information - -    Tobacco Social History   Tobacco Use  Smoking Status Never Smoker  Smokeless Tobacco Never Used     Counseling given: Not Answered   Clinical Intake:           Diabetes: No  How often do you need to have someone help you when you read instructions, pamphlets, or other written materials from your doctor or pharmacy?: 1 - Never  Interpreter Needed?: No     Past Medical History:  Diagnosis Date  . Allergy   . Anxiety   . Arthritis   . BRCA negative 2016   MyRisk neg   . Depression   . Family history of ovarian cancer 2016   MyRisk neg;  affected sister is BRCA neg  . GERD (gastroesophageal reflux disease)   . Herpes simplex   . High cholesterol   . Hypercholesteremia   . Hyperlipidemia   . Hypothyroidism   . Osteopenia    DEXA 09/02/13   . Rosacea   . Ruptured disc, cervical   . Thyroid disease   . Vertigo    per pt cervicogenic Physical therapy helped   . Vitamin D deficiency   . Wears dentures    full upper   Past Surgical History:  Procedure Laterality Date  . CERVICAL BIOPSY  W/ LOOP ELECTRODE EXCISION    . COLONOSCOPY    . COLONOSCOPY WITH PROPOFOL N/A 03/30/2018   Procedure: COLONOSCOPY WITH Biopsies;  Surgeon: WLucilla Lame MD;  Location: [facility]  Service: Endoscopy;  Laterality: N/A;  . DILATION AND CURETTAGE OF UTERUS  30 years ago  . EYE SURGERY     b/l cataract   . POLYPECTOMY N/A 03/30/2018   Procedure: POLYPECTOMY;  Surgeon: WLucilla Lame MD;  [facility]  Service: Endoscopy;  Laterality: N/A;   Family History  Problem Relation Age of Onset  . Cancer Mother        kidney met to lungs   . Diabetes Mother   . Thyroid disease Mother   . Hypertension Father   . Stroke Father   .  Thyroid disease Sister   . Ovarian cancer Sister 60       BRCA neg  . Diabetes Daughter        type 1   . Colon cancer Maternal Aunt 71  . Heart disease Other   . Breast cancer Neg Hx    Social History   Socioeconomic History  . Marital status: Married    Spouse name: Not on file  . Number of children: Not on file  . Years of education: Not on file  . Highest education level: Not on file  Occupational History  . Not on file  Tobacco Use  . Smoking status: Never Smoker  . Smokeless tobacco: Never Used  Substance and Sexual Activity  . Alcohol use: No    Alcohol/week: 0.0 standard drinks  . Drug use: No  . Sexual activity: Yes    Birth control/protection: Post-menopausal  Other Topics Concern  . Not on file  Social History Narrative    [facility] retired as of  04/02/18    Social Determinants of Health   Financial Resource Strain:   . Difficulty of Paying Living Expenses:   Food Insecurity:   . Worried About Charity fundraiser in the Last Year:   . Arboriculturist in the Last Year:   Transportation Needs:   . Film/video editor (Medical):   Marland Kitchen Lack of Transportation (Non-Medical):   Physical Activity:   . Days of Exercise per Week:   . Minutes of Exercise per Session:   Stress:   . Feeling of Stress :   Social Connections:   . Frequency of Communication with Friends and Family:   . Frequency of Social Gatherings with Friends and Family:   . Attends Religious Services:   . Active Member of Clubs or Organizations:   . Attends Archivist Meetings:   Marland Kitchen Marital Status:     Outpatient Encounter Medications as of 09/15/2019  Medication Sig  . Ascorbic Acid (VITAMIN C) 500 MG CHEW   . aspirin 81 MG tablet Take 81 mg by mouth daily.  Marland Kitchen BIOTIN PO Take 1 tablet by mouth daily.  . Cholecalciferol 1000 units tablet Take 1 tablet (1,000 Units total) by mouth daily.  Marland Kitchen estradiol (ESTRACE) 0.1 MG/GM vaginal cream Insert 1 g vaginally once weekly  . levothyroxine (SYNTHROID) 88 MCG tablet Take 1 tablet (88 mcg total) by mouth daily before breakfast. Empty stomach  . lovastatin (MEVACOR) 40 MG tablet Take 1 tablet (40 mg total) by mouth at bedtime.  . magnesium chloride (SLOW-MAG) 64 MG TBEC SR tablet Take 1 tablet by mouth.  . Meclizine HCl (ANTIVERT PO) Take by mouth as needed.  . meloxicam (MOBIC) 15 MG tablet Take 1 tablet (15 mg total) by mouth daily as needed for pain.  . Multiple Vitamins-Minerals (MULTIVITAMIN ADULT PO)   . omeprazole (PRILOSEC) 40 MG capsule Take 1 capsule (40 mg total) by mouth daily. Wait 3-4 hours after thyroid med. Take 30 minutes before food lunch/dinner  . PARoxetine (PAXIL) 20 MG tablet Take 1 tablet (20 mg total) by mouth daily.  . polyethylene glycol powder (GLYCOLAX/MIRALAX) powder Take 17 g by mouth  daily as needed.  . valACYclovir (VALTREX) 500 MG tablet Take 1 tab daily as preventive or BID for 3 days prn sx flare  . vitamin B-12 (CYANOCOBALAMIN) 500 MCG tablet Take 500 mcg by mouth daily.   No facility-administered encounter medications on file as of 09/15/2019.  Activities of Daily Living In your present state of health, do you have any difficulty performing the following activities: 09/15/2019  Hearing? N  Vision? N  Difficulty concentrating or making decisions? N  Walking or climbing stairs? N  Dressing or bathing? N  Doing errands, shopping? N  Preparing Food and eating ? N  Using the Toilet? N  In the past six months, have you accidently leaked urine? N  Do you have problems with loss of bowel control? N  Managing your Medications? N  Managing your Finances? N  Housekeeping or managing your Housekeeping? N  Some recent data might be hidden    Patient Care Team: McLean-Scocuzza, Nino Glow, MD as PCP - General (Internal Medicine)    Assessment:   This is a routine wellness examination for [place].  Nurse connected with patient 09/15/19 at  3:30 PM EDT by a telephone enabled telemedicine application and verified that I am speaking with the correct person using two identifiers. Patient stated full name and DOB. Patient gave permission to continue with virtual visit. Patient's location was at home and Nurse's location was at [place] office.   Patient is alert and oriented x3. Patient denies difficulty focusing or concentrating. Patient likes to spend time with her grand children for brain health. MMSE/6CIT omitted per patient preference.   Health Maintenance Due: See completed HM at the end of note.   Eye: Visual acuity not assessed. Virtual visit. Followed by their ophthalmologist.  Dental: Visits every 6 months.    Hearing: Demonstrates normal hearing during visit.  Safety:  Patient feels safe at home- yes Patient does have smoke detectors at home-  yes Patient does wear sunscreen or protective clothing when in direct sunlight - yes Patient does wear seat belt when in a moving vehicle - yes Patient drives- yes Adequate lighting in walkways free from debris- yes Grab bars and handrails used as appropriate- yes Ambulates with an assistive device- no Cell phone on person when ambulating outside of the home- yes  Social: Alcohol intake - no  Smoking history- never   Smokers in home? none Illicit drug use? none  Medication: Taking as directed and without issues.  Currently taking multivitamin with CA added and Vit D 1000 U. Questions how much to now take after completed Bone Density. Deferred to pcp for follow up.  Self managed - yes   Covid-19: Precautions and sickness symptoms discussed. Wears mask, social distancing, hand hygiene as appropriate.   Activities of Daily Living Patient denies needing assistance with: household chores, feeding themselves, getting from bed to chair, getting to the toilet, bathing/showering, dressing, managing money, or preparing meals.   Discussed the importance of a healthy diet, water intake and the benefits of aerobic exercise.   Physical activity- walking, no routine. Spends time at the playground, going down the slides and being playful with her grandchildren.   Diet:  Regular Water: fair intake Caffeine: 2 cup of coffee  Other Providers Patient Care Team: McLean-Scocuzza, Nino Glow, MD as PCP - General (Internal Medicine)  Exercise Activities and Dietary recommendations Current Exercise Habits: Home exercise routine, Type of exercise: walking, Intensity: Moderate  Goals    . Follow up with Primary Care Provider     As needed.       Fall Risk Fall Risk  09/15/2019 04/06/2019 11/09/2015 05/11/2015  Falls in the past year? 0 0 No No  Follow up Falls evaluation completed - - -   Timed Get Up and Go performed: no,  virtual visit  Depression Screen PHQ 2/9 Scores 09/15/2019 04/06/2019  04/21/2017 11/09/2015  PHQ - 2 Score 0 0 0 0  PHQ- 9 Score - - 0 -     Cognitive Function MMSE - Mini Mental State Exam 09/15/2019  Not completed: Unable to complete        Immunization History  Administered Date(s) Administered  . Hepatitis B, adult 05/01/2017, 05/29/2017, 10/29/2017  . Influenza, High Dose Seasonal PF 04/02/2018  . Influenza, Seasonal, Injecte, Preservative Fre 03/03/2012  . Influenza,inj,Quad PF,6+ Mos 06/15/2019  . Pneumococcal Conjugate-13 02/22/2016, 04/21/2017  . Pneumococcal Polysaccharide-23 04/22/2018  . Zoster 07/30/2012   Screening Tests Health Maintenance  Topic Date Due  . INFLUENZA VACCINE  01/02/2020  . MAMMOGRAM  08/22/2021  . COLONOSCOPY  03/31/2023  . DEXA SCAN  Completed  . Hepatitis C Screening  Completed  . PNA vac Low Risk Adult  Completed      Plan:   Keep all routine maintenance appointments.   Follow up 10/05/19 @ 8:00  Currently taking multivitamin with calcium added and Vit D 1000 U daily. Bone density osteopenia. Patient questions what dose change needs to be made. Notes message/question sent via mychart yesterday.  Currently taking care of grandchildren, which keeps her on her feet for exercise. Deferred to pcp for follow up.   End of life planning; Advance aging; Advanced directives discussed.  Copy of current HCPOA/Living Will requested.    Medicare Attestation I have personally reviewed: The patient's medical and social history Their use of alcohol, tobacco or illicit drugs Their current medications and supplements The patient's functional ability including ADLs,fall risks, home safety risks, cognitive, and hearing and visual impairment Diet and physical activities Evidence for depression   I have reviewed and discussed with patient certain preventive protocols, quality metrics, and best practice recommendations.   Varney Biles, LPN  10/03/7739   Agree  TMS

## 2019-09-15 NOTE — Patient Instructions (Addendum)
  Ms. Saam , Thank you for taking time to come for your Medicare Wellness Visit. I appreciate your ongoing commitment to your health goals. Please review the following plan we discussed and let me know if I can assist you in the future.   These are the goals we discussed: Goals    . Follow up with Primary Care Provider     As needed.       This is a list of the screening recommended for you and due dates:  Health Maintenance  Topic Date Due  . Flu Shot  01/02/2020  . Mammogram  08/22/2021  . Colon Cancer Screening  03/31/2023  . DEXA scan (bone density measurement)  Completed  .  Hepatitis C: One time screening is recommended by Center for Disease Control  (CDC) for  adults born from [birth year] through 1966.   Completed  . Pneumonia vaccines  Completed

## 2019-09-17 ENCOUNTER — Encounter: Payer: Self-pay | Admitting: Internal Medicine

## 2019-09-17 NOTE — Telephone Encounter (Signed)
Patient informed and verbalized understanding

## 2019-09-17 NOTE — Telephone Encounter (Signed)
Rec calcium 600 mg 2x per day

## 2019-10-05 ENCOUNTER — Ambulatory Visit (INDEPENDENT_AMBULATORY_CARE_PROVIDER_SITE_OTHER): Payer: Medicare Other | Admitting: Internal Medicine

## 2019-10-05 ENCOUNTER — Encounter: Payer: Self-pay | Admitting: Internal Medicine

## 2019-10-05 ENCOUNTER — Other Ambulatory Visit: Payer: Self-pay

## 2019-10-05 VITALS — BP 106/70 | HR 72 | Temp 97.3°F | Ht 63.0 in | Wt 122.0 lb

## 2019-10-05 DIAGNOSIS — E538 Deficiency of other specified B group vitamins: Secondary | ICD-10-CM

## 2019-10-05 DIAGNOSIS — E039 Hypothyroidism, unspecified: Secondary | ICD-10-CM | POA: Diagnosis not present

## 2019-10-05 DIAGNOSIS — E785 Hyperlipidemia, unspecified: Secondary | ICD-10-CM | POA: Diagnosis not present

## 2019-10-05 DIAGNOSIS — D7589 Other specified diseases of blood and blood-forming organs: Secondary | ICD-10-CM

## 2019-10-05 DIAGNOSIS — R748 Abnormal levels of other serum enzymes: Secondary | ICD-10-CM | POA: Diagnosis not present

## 2019-10-05 NOTE — Progress Notes (Signed)
Chief Complaint  Patient presents with  . Follow-up   F/u doing well  1. Hypothyroidism on levo 88 mcg tsh normal  2. HLD on mevacor 40 mg qhs doing well  3. H/o depression controlled on paxil 20 mg qd   Review of Systems  Constitutional: Negative for weight loss.  HENT: Negative for hearing loss.   Eyes: Negative for blurred vision.  Respiratory: Negative for shortness of breath.   Cardiovascular: Negative for chest pain.  Gastrointestinal: Negative for abdominal pain.  Musculoskeletal: Negative for falls.  Skin: Negative for rash.  Neurological: Negative for headaches.  Psychiatric/Behavioral: Negative for depression.   Past Medical History:  Diagnosis Date  . Allergy   . Anti-cardiolipin antibody positive    Dr. Jefm Bryant   . Anxiety   . Arthritis   . BRCA negative 2016   MyRisk neg   . Depression   . Family history of ovarian cancer 2016   MyRisk neg; affected sister is BRCA neg  . GERD (gastroesophageal reflux disease)   . Herpes simplex   . High cholesterol   . Hypercholesteremia   . Hyperlipidemia   . Hypothyroidism   . Osteopenia    DEXA 09/02/13   . Rosacea   . Ruptured disc, cervical   . Thyroid disease   . Vertigo    per pt cervicogenic Physical therapy helped   . Vitamin D deficiency   . Wears dentures    full upper   Past Surgical History:  Procedure Laterality Date  . CERVICAL BIOPSY  W/ LOOP ELECTRODE EXCISION    . COLONOSCOPY    . COLONOSCOPY WITH PROPOFOL N/A 03/30/2018   Procedure: COLONOSCOPY WITH Biopsies;  Surgeon: [name], MD;  Location: [place];  Service: Endoscopy;  Laterality: N/A;  . DILATION AND CURETTAGE OF UTERUS  30 years ago  . EYE SURGERY     b/l cataract   . POLYPECTOMY N/A 03/30/2018   Procedure: POLYPECTOMY;  Surgeon: [name], MD;  Location: [place];  Service: Endoscopy;  Laterality: N/A;   Family History  Problem Relation Age of Onset  . Cancer Mother        kidney met to lungs   .  Diabetes Mother   . Thyroid disease Mother   . Hypertension Father   . Stroke Father   . Thyroid disease Sister   . Ovarian cancer Sister 65       BRCA neg  . Diabetes Daughter        type 1   . Colon cancer Maternal Aunt 94  . Heart disease Other   . Breast cancer Neg Hx    Social History   Socioeconomic History  . Marital status: Married    Spouse name: Not on file  . Number of children: Not on file  . Years of education: Not on file  . Highest education level: Not on file  Occupational History  . Not on file  Tobacco Use  . Smoking status: Never Smoker  . Smokeless tobacco: Never Used  Substance and Sexual Activity  . Alcohol use: No    Alcohol/week: 0.0 standard drinks  . Drug use: No  . Sexual activity: Yes    Birth control/protection: Post-menopausal  Other Topics Concern  . Not on file  Social History Narrative    [facility] retired as of 04/02/18    Social Determinants of Health   Financial Resource Strain:   . Difficulty of Paying Living Expenses:   Food  Insecurity:   . Worried About Charity fundraiser in the Last Year:   . Arboriculturist in the Last Year:   Transportation Needs:   . Film/video editor (Medical):   Marland Kitchen Lack of Transportation (Non-Medical):   Physical Activity:   . Days of Exercise per Week:   . Minutes of Exercise per Session:   Stress:   . Feeling of Stress :   Social Connections:   . Frequency of Communication with Friends and Family:   . Frequency of Social Gatherings with Friends and Family:   . Attends Religious Services:   . Active Member of Clubs or Organizations:   . Attends Archivist Meetings:   Marland Kitchen Marital Status:   Intimate Partner Violence:   . Fear of Current or Ex-Partner:   . Emotionally Abused:   Marland Kitchen Physically Abused:   . Sexually Abused:    Current Meds  Medication Sig  . Ascorbic Acid (VITAMIN C) 500 MG CHEW   . aspirin 81 MG tablet Take 81 mg by mouth daily.  Marland Kitchen BIOTIN PO Take 1 tablet  by mouth daily.  . Calcium 500 MG tablet Take 500 mg by mouth in the morning and at bedtime.  . Cholecalciferol 1000 units tablet Take 1 tablet (1,000 Units total) by mouth daily. (Patient taking differently: Take 2,000 Units by mouth daily. )  . estradiol (ESTRACE) 0.1 MG/GM vaginal cream Insert 1 g vaginally once weekly  . levothyroxine (SYNTHROID) 88 MCG tablet Take 1 tablet (88 mcg total) by mouth daily before breakfast. Empty stomach  . lovastatin (MEVACOR) 40 MG tablet Take 1 tablet (40 mg total) by mouth at bedtime.  . magnesium chloride (SLOW-MAG) 64 MG TBEC SR tablet Take 1 tablet by mouth.  . Meclizine HCl (ANTIVERT PO) Take by mouth as needed.  . meloxicam (MOBIC) 15 MG tablet Take 1 tablet (15 mg total) by mouth daily as needed for pain.  . Multiple Vitamins-Minerals (MULTIVITAMIN ADULT PO)   . omeprazole (PRILOSEC) 40 MG capsule Take 1 capsule (40 mg total) by mouth daily. Wait 3-4 hours after thyroid med. Take 30 minutes before food lunch/dinner  . PARoxetine (PAXIL) 20 MG tablet Take 1 tablet (20 mg total) by mouth daily.  . polyethylene glycol powder (GLYCOLAX/MIRALAX) powder Take 17 g by mouth daily as needed.  . valACYclovir (VALTREX) 500 MG tablet Take 1 tab daily as preventive or BID for 3 days prn sx flare  . vitamin B-12 (CYANOCOBALAMIN) 500 MCG tablet Take 500 mcg by mouth daily.   Allergies  Allergen Reactions  . Penicillin V Potassium Anaphylaxis  . Penicillins Anaphylaxis  . Tetanus Toxoid Nausea And Vomiting   No results found for this or any previous visit (from the past 2160 hour(s)). Objective  Body mass index is 21.61 kg/m. Wt Readings from Last 3 Encounters:  10/05/19 122 lb (55.3 kg)  09/15/19 123 lb (55.8 kg)  06/15/19 123 lb (55.8 kg)   Temp Readings from Last 3 Encounters:  10/05/19 (!) 97.3 F (36.3 C) (Temporal)  04/02/18 98.1 F (36.7 C) (Oral)  03/30/18 (!) 97.4 F (36.3 C)   BP Readings from Last 3 Encounters:  10/05/19 106/70   06/15/19 100/70  04/02/18 102/60   Pulse Readings from Last 3 Encounters:  10/05/19 72  04/02/18 70  03/30/18 64    Physical Exam Vitals and nursing note reviewed.  Constitutional:      Appearance: Normal appearance. She is well-developed.  HENT:  Head: Normocephalic and atraumatic.  Eyes:     Conjunctiva/sclera: Conjunctivae normal.     Pupils: Pupils are equal, round, and reactive to light.  Cardiovascular:     Rate and Rhythm: Normal rate and regular rhythm.     Heart sounds: Normal heart sounds. No murmur.  Pulmonary:     Effort: Pulmonary effort is normal.     Breath sounds: Normal breath sounds.  Abdominal:     General: Abdomen is flat. Bowel sounds are normal.     Tenderness: There is no abdominal tenderness.  Skin:    General: Skin is warm and dry.  Neurological:     General: No focal deficit present.     Mental Status: She is alert and oriented to person, place, and time. Mental status is at baseline.     Gait: Gait normal.  Psychiatric:        Attention and Perception: Attention and perception normal.        Mood and Affect: Mood and affect normal.        Speech: Speech normal.        Behavior: Behavior normal. Behavior is cooperative.        Thought Content: Thought content normal.        Cognition and Memory: Cognition normal.        Judgment: Judgment normal.     Assessment  Plan  Hypothyroidism, unspecified type - Plan: Comprehensive metabolic panel, TSH, CBC w/Diff  Hyperlipidemia, unspecified hyperlipidemia type - Plan: Lipid panel, CBC w/Diff  Elevated liver enzymes - Plan: Comprehensive metabolic panel, CBC w/Diff  Macrocytosis - Plan: CBC w/Diff B12 deficiency - Plan: B12   HM Flu shot utd  utd prevnar and pna 23 utd  Allergic to Tdap Had3/3 hep B vaccinescheck titer  Had zostavax but disc shingrixgiven Rx today  mammo3/22/21 neg   Last pap [facility]2017 will get records at f/u of note had genetic testing  ovarian cancer with OB/GYN neg -pap 03/23/15 neg pap HPV neg Dr. Laurey Morale -appt [facility] 08/90 OB/GYN  colonoscopy10/28/19 2 4-6 mm polpys diverticulosis tubular and sessile serrated Dr. Allen Norris f/u in 3-5 years  DEXA had 08/23/19 osteopenia   Dermatology appt 07/2019 [facility]. Normal exam 05/2018 exam  -petechie to legs vitamin C started ? If were a vasculitis F/u 07/2020  Never smoker, no etoh, drugs  Provider: Dr. Olivia Mackie McLean-Scocuzza-Internal Medicine

## 2019-12-07 ENCOUNTER — Other Ambulatory Visit (INDEPENDENT_AMBULATORY_CARE_PROVIDER_SITE_OTHER): Payer: Medicare Other

## 2019-12-07 ENCOUNTER — Other Ambulatory Visit: Payer: Self-pay

## 2019-12-07 DIAGNOSIS — E785 Hyperlipidemia, unspecified: Secondary | ICD-10-CM

## 2019-12-07 DIAGNOSIS — R748 Abnormal levels of other serum enzymes: Secondary | ICD-10-CM

## 2019-12-07 DIAGNOSIS — D7589 Other specified diseases of blood and blood-forming organs: Secondary | ICD-10-CM | POA: Diagnosis not present

## 2019-12-07 DIAGNOSIS — E538 Deficiency of other specified B group vitamins: Secondary | ICD-10-CM

## 2019-12-07 DIAGNOSIS — E039 Hypothyroidism, unspecified: Secondary | ICD-10-CM

## 2019-12-07 LAB — COMPREHENSIVE METABOLIC PANEL
ALT: 19 U/L (ref 0–35)
AST: 19 U/L (ref 0–37)
Albumin: 4.3 g/dL (ref 3.5–5.2)
Alkaline Phosphatase: 47 U/L (ref 39–117)
BUN: 15 mg/dL (ref 6–23)
CO2: 27 mEq/L (ref 19–32)
Calcium: 9.6 mg/dL (ref 8.4–10.5)
Chloride: 104 mEq/L (ref 96–112)
Creatinine, Ser: 0.9 mg/dL (ref 0.40–1.20)
GFR: 61.85 mL/min (ref >60.00)
Glucose, Bld: 86 mg/dL (ref 70–99)
Potassium: 4.2 mEq/L (ref 3.5–5.1)
Sodium: 140 mEq/L (ref 135–145)
Total Bilirubin: 0.6 mg/dL (ref 0.2–1.2)
Total Protein: 6.5 g/dL (ref 6.0–8.3)

## 2019-12-07 LAB — LIPID PANEL
Cholesterol: 152 mg/dL (ref 0–200)
HDL: 65.5 mg/dL (ref >39.00)
LDL Cholesterol: 73 mg/dL (ref 0–99)
NonHDL: 86.58
Total CHOL/HDL Ratio: 2
Triglycerides: 66 mg/dL (ref 0.0–149.0)
VLDL: 13.2 mg/dL (ref 0.0–40.0)

## 2019-12-07 LAB — CBC WITH DIFFERENTIAL/PLATELET
Basophils Absolute: 0 10*3/uL (ref 0.0–0.1)
Basophils Relative: 0.7 % (ref 0.0–3.0)
Eosinophils Absolute: 0.2 10*3/uL (ref 0.0–0.7)
Eosinophils Relative: 3.5 % (ref 0.0–5.0)
HCT: 37.5 % (ref 36.0–46.0)
Hemoglobin: 12.6 g/dL (ref 12.0–15.0)
Lymphocytes Relative: 21.4 % (ref 12.0–46.0)
Lymphs Abs: 1 10*3/uL (ref 0.7–4.0)
MCHC: 33.6 g/dL (ref 30.0–36.0)
MCV: 95.3 fl (ref 78.0–100.0)
Monocytes Absolute: 0.4 10*3/uL (ref 0.1–1.0)
Monocytes Relative: 8.2 % (ref 3.0–12.0)
Neutro Abs: 3.2 10*3/uL (ref 1.4–7.7)
Neutrophils Relative %: 66.2 % (ref 43.0–77.0)
Platelets: 199 10*3/uL (ref 150.0–400.0)
RBC: 3.93 Mil/uL (ref 3.87–5.11)
RDW: 12.9 % (ref 11.5–15.5)
WBC: 4.9 10*3/uL (ref 4.0–10.5)

## 2019-12-07 LAB — VITAMIN B12: Vitamin B-12: 709 pg/mL (ref 211–911)

## 2019-12-07 LAB — TSH: TSH: 0.53 u[IU]/mL (ref 0.35–4.50)

## 2019-12-14 ENCOUNTER — Encounter: Payer: Self-pay | Admitting: Internal Medicine

## 2019-12-16 ENCOUNTER — Other Ambulatory Visit: Payer: Self-pay | Admitting: Internal Medicine

## 2019-12-16 DIAGNOSIS — G8929 Other chronic pain: Secondary | ICD-10-CM

## 2019-12-16 MED ORDER — MELOXICAM 7.5 MG PO TABS: 7.5000 mg | ORAL_TABLET | Freq: Two times a day (BID) | ORAL | 3 refills | Status: DC

## 2019-12-17 ENCOUNTER — Encounter: Payer: Self-pay | Admitting: Internal Medicine

## 2019-12-17 ENCOUNTER — Other Ambulatory Visit: Payer: Self-pay | Admitting: Internal Medicine

## 2019-12-17 DIAGNOSIS — F3342 Major depressive disorder, recurrent, in full remission: Secondary | ICD-10-CM

## 2019-12-17 MED ORDER — PAROXETINE HCL 20 MG PO TABS: 20.0000 mg | ORAL_TABLET | Freq: Every day | ORAL | 3 refills | Status: DC

## 2019-12-19 ENCOUNTER — Other Ambulatory Visit: Payer: Self-pay | Admitting: Internal Medicine

## 2019-12-19 DIAGNOSIS — M549 Dorsalgia, unspecified: Secondary | ICD-10-CM

## 2019-12-19 MED ORDER — MELOXICAM 15 MG PO TABS: 15.0000 mg | ORAL_TABLET | Freq: Every day | ORAL | 3 refills | Status: DC | PRN

## 2019-12-20 ENCOUNTER — Encounter: Payer: Self-pay | Admitting: Internal Medicine

## 2020-03-16 ENCOUNTER — Other Ambulatory Visit: Payer: Self-pay

## 2020-03-16 MED ORDER — LOVASTATIN 40 MG PO TABS: 40.0000 mg | ORAL_TABLET | Freq: Every day | ORAL | 3 refills | Status: DC

## 2020-03-16 MED ORDER — LEVOTHYROXINE SODIUM 88 MCG PO TABS: 88.0000 ug | ORAL_TABLET | Freq: Every day | ORAL | 3 refills | Status: DC

## 2020-03-21 ENCOUNTER — Other Ambulatory Visit: Payer: Self-pay

## 2020-03-21 DIAGNOSIS — K219 Gastro-esophageal reflux disease without esophagitis: Secondary | ICD-10-CM

## 2020-03-21 MED ORDER — OMEPRAZOLE 40 MG PO CPDR: 40.0000 mg | DELAYED_RELEASE_CAPSULE | Freq: Every day | ORAL | 3 refills | Status: DC

## 2020-03-23 DIAGNOSIS — Z23 Encounter for immunization: Secondary | ICD-10-CM | POA: Diagnosis not present

## 2020-08-09 DIAGNOSIS — D2261 Melanocytic nevi of right upper limb, including shoulder: Secondary | ICD-10-CM | POA: Diagnosis not present

## 2020-08-09 DIAGNOSIS — L821 Other seborrheic keratosis: Secondary | ICD-10-CM | POA: Diagnosis not present

## 2020-08-09 DIAGNOSIS — X32XXXA Exposure to sunlight, initial encounter: Secondary | ICD-10-CM | POA: Diagnosis not present

## 2020-08-09 DIAGNOSIS — L570 Actinic keratosis: Secondary | ICD-10-CM | POA: Diagnosis not present

## 2020-08-09 DIAGNOSIS — D2271 Melanocytic nevi of right lower limb, including hip: Secondary | ICD-10-CM | POA: Diagnosis not present

## 2020-08-09 DIAGNOSIS — D2262 Melanocytic nevi of left upper limb, including shoulder: Secondary | ICD-10-CM | POA: Diagnosis not present

## 2020-08-09 DIAGNOSIS — D2272 Melanocytic nevi of left lower limb, including hip: Secondary | ICD-10-CM | POA: Diagnosis not present

## 2020-08-09 DIAGNOSIS — D225 Melanocytic nevi of trunk: Secondary | ICD-10-CM | POA: Diagnosis not present

## 2020-08-14 ENCOUNTER — Other Ambulatory Visit: Payer: Self-pay | Admitting: Obstetrics and Gynecology

## 2020-08-14 DIAGNOSIS — A6004 Herpesviral vulvovaginitis: Secondary | ICD-10-CM

## 2020-08-16 ENCOUNTER — Other Ambulatory Visit: Payer: Self-pay | Admitting: Internal Medicine

## 2020-08-16 DIAGNOSIS — F3342 Major depressive disorder, recurrent, in full remission: Secondary | ICD-10-CM

## 2020-08-23 ENCOUNTER — Other Ambulatory Visit: Payer: Self-pay | Admitting: Internal Medicine

## 2020-08-23 DIAGNOSIS — Z1231 Encounter for screening mammogram for malignant neoplasm of breast: Secondary | ICD-10-CM

## 2020-08-29 ENCOUNTER — Other Ambulatory Visit: Payer: Self-pay

## 2020-08-29 ENCOUNTER — Ambulatory Visit
Admission: RE | Admit: 2020-08-29 | Discharge: 2020-08-29 | Disposition: A | Discharge status: Home / Self Care | Payer: Medicare Other | Source: Ambulatory Visit | Attending: Internal Medicine | Admitting: Internal Medicine

## 2020-08-29 DIAGNOSIS — Z1231 Encounter for screening mammogram for malignant neoplasm of breast: Secondary | ICD-10-CM | POA: Insufficient documentation

## 2020-08-30 ENCOUNTER — Encounter: Payer: Self-pay | Admitting: Internal Medicine

## 2020-08-31 ENCOUNTER — Other Ambulatory Visit: Payer: Self-pay | Admitting: Internal Medicine

## 2020-08-31 DIAGNOSIS — A6004 Herpesviral vulvovaginitis: Secondary | ICD-10-CM

## 2020-08-31 MED ORDER — VALACYCLOVIR HCL 500 MG PO TABS: ORAL_TABLET | ORAL | 3 refills | Status: DC

## 2020-09-04 ENCOUNTER — Encounter: Payer: Self-pay | Admitting: Internal Medicine

## 2020-09-18 ENCOUNTER — Ambulatory Visit (INDEPENDENT_AMBULATORY_CARE_PROVIDER_SITE_OTHER): Payer: Medicare Other

## 2020-09-18 VITALS — Ht 63.0 in | Wt 122.0 lb

## 2020-09-18 DIAGNOSIS — Z0000 Encounter for general adult medical examination without abnormal findings: Secondary | ICD-10-CM | POA: Diagnosis not present

## 2020-09-18 NOTE — Progress Notes (Signed)
Subjective:   Sandra Horton is a 70 y.o. female who presents for Medicare Annual (Subsequent) preventive examination.  Review of Systems    No ROS.  Medicare Wellness Virtual Visit.    Cardiac Risk Factors include: advanced age (>21mn, >>11women)     Objective:    Today's Vitals   09/18/20 1532  Weight: 122 lb (55.3 kg)  Height: 5' 3" (1.6 m)   Body mass index is 21.61 kg/m.  Advanced Directives 09/18/2020 09/15/2019 03/30/2018 07/07/2016 07/07/2016 11/09/2015 05/11/2015  Does Patient Have a Medical Advance Directive? Yes Yes Yes Yes No Yes No  Type of AParamedicof [place]Living will Living will;Healthcare Power of [place] will Healthcare Power of [place]rown PointLiving will -  Does patient want to make changes to medical advance directive? No - Patient declined No - Patient declined No - Patient declined No - Patient declined - No - Patient declined -  Copy of [place] Chart? No - copy requested No - copy requested No - copy requested No - copy requested - No - copy requested -  Would patient like information on creating a medical advance directive? - - - No - Patient declined Yes (ED - Information included in AVS) No - patient declined information -    Current Medications (verified) Outpatient Encounter Medications as of 09/18/2020  Medication Sig  . Ascorbic Acid (VITAMIN C) 500 MG CHEW   . aspirin 81 MG tablet Take 81 mg by mouth daily.  .Marland KitchenBIOTIN PO Take 1 tablet by mouth daily.  . Calcium 500 MG tablet Take 500 mg by mouth in the morning and at bedtime.  . Cholecalciferol 1000 units tablet Take 1 tablet (1,000 Units total) by mouth daily. (Patient taking differently: Take 2,000 Units by mouth daily. )  . estradiol (ESTRACE) 0.1 MG/GM vaginal cream Insert 1 g vaginally once weekly  . levothyroxine (SYNTHROID) 88 MCG tablet Take 1 tablet (88 mcg total) by mouth daily  before breakfast. Empty stomach  . lovastatin (MEVACOR) 40 MG tablet Take 1 tablet (40 mg total) by mouth at bedtime.  . magnesium chloride (SLOW-MAG) 64 MG TBEC SR tablet Take 1 tablet by mouth.  . Meclizine HCl (ANTIVERT PO) Take by mouth as needed.  . meloxicam (MOBIC) 15 MG tablet Take 1 tablet (15 mg total) by mouth daily as needed for pain.  . Multiple Vitamins-Minerals (MULTIVITAMIN ADULT PO)   . omeprazole (PRILOSEC) 40 MG capsule Take 1 capsule (40 mg total) by mouth daily. Wait 3-4 hours after thyroid med. Take 30 minutes before food lunch/dinner  . PARoxetine (PAXIL) 20 MG tablet TAKE 1 TABLET BY MOUTH EVERY DAY  . polyethylene glycol powder (GLYCOLAX/MIRALAX) powder Take 17 g by mouth daily as needed.  . valACYclovir (VALTREX) 500 MG tablet Take 1 tab daily as preventive or BID for 3 days prn sx flare  . vitamin B-12 (CYANOCOBALAMIN) 500 MCG tablet Take 500 mcg by mouth daily.   No facility-administered encounter medications on file as of 09/18/2020.    Allergies (verified) Penicillin v potassium, Penicillins, and Tetanus toxoid   History: Past Medical History:  Diagnosis Date  . Allergy   . Anti-cardiolipin antibody positive    Dr. KJefm Bryant  . Anxiety   . Arthritis   . BRCA negative 2016   MyRisk neg   . Depression   . Family history of ovarian cancer 2016   MyRisk neg; affected  sister is BRCA neg  . GERD (gastroesophageal reflux disease)   . Herpes simplex   . High cholesterol   . Hypercholesteremia   . Hyperlipidemia   . Hypothyroidism   . Osteopenia    DEXA 09/02/13   . Rosacea   . Ruptured disc, cervical   . Thyroid disease   . Vertigo    per pt cervicogenic Physical therapy helped   . Vitamin D deficiency   . Wears dentures    full upper   Past Surgical History:  Procedure Laterality Date  . CERVICAL BIOPSY  W/ LOOP ELECTRODE EXCISION    . COLONOSCOPY    . COLONOSCOPY WITH PROPOFOL N/A 03/30/2018   Procedure: COLONOSCOPY WITH Biopsies;  Surgeon:  [name], MD;  Location: [place];  Service: Endoscopy;  Laterality: N/A;  . DILATION AND CURETTAGE OF UTERUS  30 years ago  . EYE SURGERY     b/l cataract   . POLYPECTOMY N/A 03/30/2018   Procedure: POLYPECTOMY;  Surgeon: [name], MD;  Location: [place];  Service: Endoscopy;  Laterality: N/A;   Family History  Problem Relation Age of Onset  . Cancer Mother        kidney met to lungs   . Diabetes Mother   . Thyroid disease Mother   . Hypertension Father   . Stroke Father   . Thyroid disease Sister   . Ovarian cancer Sister 63       BRCA neg  . Diabetes Daughter        type 1   . Colon cancer Maternal Aunt 74  . Heart disease Other   . Breast cancer Neg Hx    Social History   Socioeconomic History  . Marital status: Married    Spouse name: Not on file  . Number of children: Not on file  . Years of education: Not on file  . Highest education level: Not on file  Occupational History  . Not on file  Tobacco Use  . Smoking status: Never Smoker  . Smokeless tobacco: Never Used  Vaping Use  . Vaping Use: Never used  Substance and Sexual Activity  . Alcohol use: No    Alcohol/week: 0.0 standard drinks  . Drug use: No  . Sexual activity: Yes    Birth control/protection: Post-menopausal  Other Topics Concern  . Not on file  Social History Narrative    [facility] retired as of 04/02/18    Social Determinants of Health   Financial Resource Strain: Low Risk   . Difficulty of Paying Living Expenses: Not hard at all  Food Insecurity: No Food Insecurity  . Worried About Charity fundraiser in the Last Year: Never true  . Ran Out of Food in the Last Year: Never true  Transportation Needs: No Transportation Needs  . Lack of Transportation (Medical): No  . Lack of Transportation (Non-Medical): No  Physical Activity: Not on file  Stress: No Stress Concern Present  . Feeling of Stress : Not at all  Social Connections: Unknown  .  Frequency of Communication with Friends and Family: More than three times a week  . Frequency of Social Gatherings with Friends and Family: More than three times a week  . Attends Religious Services: Not on file  . Active Member of Clubs or Organizations: Not on file  . Attends Archivist Meetings: Not on file  . Marital Status: Not on file    Tobacco Counseling Counseling given: Not  Answered   Clinical Intake:  Pre-visit preparation completed: Yes        Diabetes: No  How often do you need to have someone help you when you read instructions, pamphlets, or other written materials from your doctor or pharmacy?: 1 - Never   Interpreter Needed?: No      Activities of Daily Living In your present state of health, do you have any difficulty performing the following activities: 09/18/2020  Hearing? N  Vision? N  Difficulty concentrating or making decisions? N  Walking or climbing stairs? N  Dressing or bathing? N  Doing errands, shopping? N  Preparing Food and eating ? N  Using the Toilet? N  In the past six months, have you accidently leaked urine? N  Do you have problems with loss of bowel control? N  Managing your Medications? N  Managing your Finances? N  Housekeeping or managing your Housekeeping? N  Some recent data might be hidden    Patient Care Team: McLean-Scocuzza, Nino Glow, MD as PCP - General (Internal Medicine)  Indicate any recent Medical Services you may have received from other than [facility] providers in the past year (date may be approximate).     Assessment:   This is a routine wellness examination for Jim Thorpe.  I connected with Aislee today by telephone and verified that I am speaking with the correct person using two identifiers. Location patient: home Location provider: work Persons participating in the virtual visit: patient, Marine scientist.    I discussed the limitations, risks, security and privacy concerns of performing an evaluation and  management service by telephone and the availability of in person appointments. The patient expressed understanding and verbally consented to this telephonic visit.    Interactive audio and video telecommunications were attempted between this provider and patient, however failed, due to patient having technical difficulties OR patient did not have access to video capability.  We continued and completed visit with audio only.  Some vital signs may be absent or patient reported.   Hearing/Vision screen  Hearing Screening   125Hz 250Hz 500Hz 1000Hz 2000Hz 3000Hz 4000Hz 6000Hz 8000Hz  Right ear:           Left ear:           Comments: Patient is able to hear conversational tones without difficulty.  No issues reported.  Vision Screening Comments: Visual acuity not assessed, virtual visit.  They have seen their ophthalmologist in the last 12 months.   Dietary issues and exercise activities discussed: Current Exercise Habits: Home exercise routine, Intensity: Mild  Regular diet  Goals    . Follow up with Primary Care Provider     As needed.      Depression Screen PHQ 2/9 Scores 09/18/2020 10/05/2019 09/15/2019 04/06/2019 04/21/2017 11/09/2015 05/11/2015  PHQ - 2 Score 0 0 0 0 0 0 0  PHQ- 9 Score - - - - 0 - -    Fall Risk Fall Risk  09/18/2020 10/05/2019 09/15/2019 04/06/2019 11/09/2015  Falls in the past year? 0 0 0 0 No  Number falls in past yr: 0 0 - - -  Injury with Fall? 0 0 - - -  Follow up Falls evaluation completed Falls evaluation completed Falls evaluation completed - -    FALL RISK PREVENTION PERTAINING TO THE HOME: Handrails in use when climbing stairs? Yes Home free of loose throw rugs in walkways, pet beds, electrical cords, etc? Yes  Adequate lighting in your home to reduce risk of falls?  Yes   ASSISTIVE DEVICES UTILIZED TO PREVENT FALLS: Use of a cane, walker or w/c? No   TIMED UP AND GO: Was the test performed? No . Virtual visit.   Cognitive Function: Patient is alert  and oriented x3.  Denies difficulty focusing, making decisions, memory loss.  Enjoys reading.  MMSE/6CIT deferred. Normal by direct communication/observation.  MMSE - Mini Mental State Exam 09/15/2019  Not completed: Unable to complete       Immunizations Immunization History  Administered Date(s) Administered  . Hepatitis B, adult 05/01/2017, 05/29/2017, 10/29/2017  . Influenza, High Dose Seasonal PF 04/02/2018  . Influenza, Seasonal, Injecte, Preservative Fre 03/03/2012  . Influenza,inj,Quad PF,6+ Mos 06/15/2019  . Pneumococcal Conjugate-13 02/22/2016, 04/21/2017  . Pneumococcal Polysaccharide-23 04/22/2018  . Zoster 07/30/2012   Health Maintenance Health Maintenance  Topic Date Due  . COVID-19 Vaccine (1) 10/04/2020 (Originally 08/17/1955)  . INFLUENZA VACCINE  01/01/2021  . MAMMOGRAM  08/30/2022  . COLONOSCOPY (Pts 45-48yr Insurance coverage will need to be confirmed)  03/31/2023  . DEXA SCAN  Completed  . Hepatitis C Screening  Completed  . PNA vac Low Risk Adult  Completed  . HPV VACCINES  Aged Out   Colorectal cancer screening: Type of screening: Colonoscopy. Completed 03/30/18. Repeat every 5 years  Mammogram status: Completed 08/29/20. Repeat every year   Bone Density status: Completed 08/23/19. Results reflect: Bone density results: OSTEOPENIA. Repeat every 2-5 years.  Lung Cancer Screening: (Low Dose CT Chest recommended if Age 71-80years, 30 pack-year currently smoking OR have quit w/in 15years.) does not qualify.   Vision Screening: Recommended annual ophthalmology exams for early detection of glaucoma and other disorders of the eye. Is the patient up to date with their annual eye exam?  Yes   Dental Screening: Recommended annual dental exams for proper oral hygiene. Visits every 6 months. Dentures.   Community Resource Referral / Chronic Care Management: CRR required this visit?  No   CCM required this visit?  No      Plan:   Keep all routine  maintenance appointments.   Follow up 09/18/20 @ 10:00  Follow up 10/05/20 @ 8:30  I have personally reviewed and noted the following in the patient's chart:   . Medical and social history . Use of alcohol, tobacco or illicit drugs  . Current medications and supplements . Functional ability and status . Nutritional status . Physical activity . Advanced directives . List of other physicians . Hospitalizations, surgeries, and ER visits in previous 12 months . Vitals . Screenings to include cognitive, depression, and falls . Referrals and appointments  In addition, I have reviewed and discussed with patient certain preventive protocols, quality metrics, and best practice recommendations. A written personalized care plan for preventive services as well as general preventive health recommendations were provided to patient via mychart.     OVarney Biles LPN   [id number]

## 2020-09-18 NOTE — Patient Instructions (Addendum)
Sandra Horton , Thank you for taking time to come for your Medicare Wellness Visit. I appreciate your ongoing commitment to your health goals. Please review the following plan we discussed and let me know if I can assist you in the future.   These are the goals we discussed: Goals    . Follow up with Primary Care Provider     As needed.       This is a list of the screening recommended for you and due dates:  Health Maintenance  Topic Date Due  . COVID-19 Vaccine (1) 10/04/2020*  . Flu Shot  01/01/2021  . Mammogram  08/30/2022  . Colon Cancer Screening  03/31/2023  . DEXA scan (bone density measurement)  Completed  .  Hepatitis C: One time screening is recommended by Center for Disease Control  (CDC) for  adults born from [birth year] through 1966.   Completed  . Pneumonia vaccines  Completed  . HPV Vaccine  Aged Out  *Topic was postponed. The date shown is not the original due date.    Immunizations Immunization History  Administered Date(s) Administered  . Hepatitis B, adult 05/01/2017, 05/29/2017, 10/29/2017  . Influenza, High Dose Seasonal PF 04/02/2018  . Influenza, Seasonal, Injecte, Preservative Fre 03/03/2012  . Influenza,inj,Quad PF,6+ Mos 06/15/2019  . Pneumococcal Conjugate-13 02/22/2016, 04/21/2017  . Pneumococcal Polysaccharide-23 04/22/2018  . Zoster 07/30/2012   Keep all routine maintenance appointments.   Follow up 10/05/20 @ 8:30  Advanced directives: End of life planning; Advance aging; Advanced directives discussed.  Copy of current HCPOA/Living Will requested.    Conditions/risks identified: none new.   Next appointment: Follow up in one year for your annual wellness visit.   Preventive Care 71 Years and Older, Female Preventive care refers to lifestyle choices and visits with your health care provider that can promote health and wellness. What does preventive care include?  A yearly physical exam. This is also called an annual well check.  Dental exams  once or twice a year.  Routine eye exams. Ask your health care provider how often you should have your eyes checked.  Personal lifestyle choices, including:  Daily care of your teeth and gums.  Regular physical activity.  Eating a healthy diet.  Avoiding tobacco and drug use.  Limiting alcohol use.  Practicing safe sex.  Taking low-dose aspirin every day.  Taking vitamin and mineral supplements as recommended by your health care provider. What happens during an annual well check? The services and screenings done by your health care provider during your annual well check will depend on your age, overall health, lifestyle risk factors, and family history of disease. Counseling  Your health care provider may ask you questions about your:  Alcohol use.  Tobacco use.  Drug use.  Emotional well-being.  Home and relationship well-being.  Sexual activity.  Eating habits.  History of falls.  Memory and ability to understand (cognition).  Work and work Statistician.  Reproductive health. Screening  You may have the following tests or measurements:  Height, weight, and BMI.  Blood pressure.  Lipid and cholesterol levels. These may be checked every 5 years, or more frequently if you are over 28 years old.  Skin check.  Lung cancer screening. You may have this screening every year starting at age 35 if you have a 30-pack-year history of smoking and currently smoke or have quit within the past 15 years.  Fecal occult blood test (FOBT) of the stool. You may have this test every  year starting at age 40.  Flexible sigmoidoscopy or colonoscopy. You may have a sigmoidoscopy every 5 years or a colonoscopy every 10 years starting at age 33.  Hepatitis C blood test.  Hepatitis B blood test.  Sexually transmitted disease (STD) testing.  Diabetes screening. This is done by checking your blood sugar (glucose) after you have not eaten for a while (fasting). You may have  this done every 1-3 years.  Bone density scan. This is done to screen for osteoporosis. You may have this done starting at age 26.  Mammogram. This may be done every 1-2 years. Talk to your health care provider about how often you should have regular mammograms. Talk with your health care provider about your test results, treatment options, and if necessary, the need for more tests. Vaccines  Your health care provider may recommend certain vaccines, such as:  Influenza vaccine. This is recommended every year.  Tetanus, diphtheria, and acellular pertussis (Tdap, Td) vaccine. You may need a Td booster every 10 years.  Zoster vaccine. You may need this after age 55.  Pneumococcal 13-valent conjugate (PCV13) vaccine. One dose is recommended after age 65.  Pneumococcal polysaccharide (PPSV23) vaccine. One dose is recommended after age 80. Talk to your health care provider about which screenings and vaccines you need and how often you need them. This information is not intended to replace advice given to you by your health care provider. Make sure you discuss any questions you have with your health care provider. Document Released: 06/16/2015 Document Revised: 02/07/2016 Document Reviewed: 03/21/2015 Elsevier Interactive Patient Education  2017 [place] Prevention in the Home Falls can cause injuries. They can happen to people of all ages. There are many things you can do to make your home safe and to help prevent falls. What can I do on the outside of my home?  Regularly fix the edges of walkways and driveways and fix any cracks.  Remove anything that might make you trip as you walk through a door, such as a raised step or threshold.  Trim any bushes or trees on the path to your home.  Use bright outdoor lighting.  Clear any walking paths of anything that might make someone trip, such as rocks or tools.  Regularly check to see if handrails are loose or broken. Make sure  that both sides of any steps have handrails.  Any raised decks and porches should have guardrails on the edges.  Have any leaves, snow, or ice cleared regularly.  Use sand or salt on walking paths during winter.  Clean up any spills in your garage right away. This includes oil or grease spills. What can I do in the bathroom?  Use night lights.  Install grab bars by the toilet and in the tub and shower. Do not use towel bars as grab bars.  Use non-skid mats or decals in the tub or shower.  If you need to sit down in the shower, use a plastic, non-slip stool.  Keep the floor dry. Clean up any water that spills on the floor as soon as it happens.  Remove soap buildup in the tub or shower regularly.  Attach bath mats securely with double-sided non-slip rug tape.  Do not have throw rugs and other things on the floor that can make you trip. What can I do in the bedroom?  Use night lights.  Make sure that you have a light by your bed that is easy to reach.  Do  not use any sheets or blankets that are too big for your bed. They should not hang down onto the floor.  Have a firm chair that has side arms. You can use this for support while you get dressed.  Do not have throw rugs and other things on the floor that can make you trip. What can I do in the kitchen?  Clean up any spills right away.  Avoid walking on wet floors.  Keep items that you use a lot in easy-to-reach places.  If you need to reach something above you, use a strong step stool that has a grab bar.  Keep electrical cords out of the way.  Do not use floor polish or wax that makes floors slippery. If you must use wax, use non-skid floor wax.  Do not have throw rugs and other things on the floor that can make you trip. What can I do with my stairs?  Do not leave any items on the stairs.  Make sure that there are handrails on both sides of the stairs and use them. Fix handrails that are broken or loose. Make  sure that handrails are as long as the stairways.  Check any carpeting to make sure that it is firmly attached to the stairs. Fix any carpet that is loose or worn.  Avoid having throw rugs at the top or bottom of the stairs. If you do have throw rugs, attach them to the floor with carpet tape.  Make sure that you have a light switch at the top of the stairs and the bottom of the stairs. If you do not have them, ask someone to add them for you. What else can I do to help prevent falls?  Wear shoes that:  Do not have high heels.  Have rubber bottoms.  Are comfortable and fit you well.  Are closed at the toe. Do not wear sandals.  If you use a stepladder:  Make sure that it is fully opened. Do not climb a closed stepladder.  Make sure that both sides of the stepladder are locked into place.  Ask someone to hold it for you, if possible.  Clearly mark and make sure that you can see:  Any grab bars or handrails.  First and last steps.  Where the edge of each step is.  Use tools that help you move around (mobility aids) if they are needed. These include:  Canes.  Walkers.  Scooters.  Crutches.  Turn on the lights when you go into a dark area. Replace any light bulbs as soon as they burn out.  Set up your furniture so you have a clear path. Avoid moving your furniture around.  If any of your floors are uneven, fix them.  If there are any pets around you, be aware of where they are.  Review your medicines with your doctor. Some medicines can make you feel dizzy. This can increase your chance of falling. Ask your doctor what other things that you can do to help prevent falls. This information is not intended to replace advice given to you by your health care provider. Make sure you discuss any questions you have with your health care provider. Document Released: 03/16/2009 Document Revised: 10/26/2015 Document Reviewed: 06/24/2014 Elsevier Interactive Patient Education   2017 [employer].

## 2020-09-21 ENCOUNTER — Other Ambulatory Visit: Payer: Self-pay

## 2020-09-21 ENCOUNTER — Ambulatory Visit (INDEPENDENT_AMBULATORY_CARE_PROVIDER_SITE_OTHER): Payer: Medicare Other | Admitting: Internal Medicine

## 2020-09-21 ENCOUNTER — Encounter: Payer: Self-pay | Admitting: Internal Medicine

## 2020-09-21 VITALS — BP 124/70 | HR 66 | Temp 97.4°F | Ht 63.0 in | Wt 127.0 lb

## 2020-09-21 DIAGNOSIS — R197 Diarrhea, unspecified: Secondary | ICD-10-CM | POA: Diagnosis not present

## 2020-09-21 DIAGNOSIS — K6289 Other specified diseases of anus and rectum: Secondary | ICD-10-CM

## 2020-09-21 DIAGNOSIS — A6004 Herpesviral vulvovaginitis: Secondary | ICD-10-CM

## 2020-09-21 DIAGNOSIS — M549 Dorsalgia, unspecified: Secondary | ICD-10-CM

## 2020-09-21 DIAGNOSIS — K219 Gastro-esophageal reflux disease without esophagitis: Secondary | ICD-10-CM

## 2020-09-21 DIAGNOSIS — K5909 Other constipation: Secondary | ICD-10-CM

## 2020-09-21 DIAGNOSIS — Z20822 Contact with and (suspected) exposure to covid-19: Secondary | ICD-10-CM | POA: Diagnosis not present

## 2020-09-21 DIAGNOSIS — R195 Other fecal abnormalities: Secondary | ICD-10-CM

## 2020-09-21 DIAGNOSIS — N952 Postmenopausal atrophic vaginitis: Secondary | ICD-10-CM | POA: Diagnosis not present

## 2020-09-21 DIAGNOSIS — R1084 Generalized abdominal pain: Secondary | ICD-10-CM

## 2020-09-21 DIAGNOSIS — E559 Vitamin D deficiency, unspecified: Secondary | ICD-10-CM

## 2020-09-21 DIAGNOSIS — Z13220 Encounter for screening for lipoid disorders: Secondary | ICD-10-CM | POA: Diagnosis not present

## 2020-09-21 DIAGNOSIS — F3342 Major depressive disorder, recurrent, in full remission: Secondary | ICD-10-CM | POA: Diagnosis not present

## 2020-09-21 DIAGNOSIS — G8929 Other chronic pain: Secondary | ICD-10-CM

## 2020-09-21 DIAGNOSIS — E039 Hypothyroidism, unspecified: Secondary | ICD-10-CM | POA: Diagnosis not present

## 2020-09-21 DIAGNOSIS — J069 Acute upper respiratory infection, unspecified: Secondary | ICD-10-CM

## 2020-09-21 DIAGNOSIS — I700 Atherosclerosis of aorta: Secondary | ICD-10-CM

## 2020-09-21 DIAGNOSIS — Z1231 Encounter for screening mammogram for malignant neoplasm of breast: Secondary | ICD-10-CM

## 2020-09-21 DIAGNOSIS — M542 Cervicalgia: Secondary | ICD-10-CM

## 2020-09-21 DIAGNOSIS — K529 Noninfective gastroenteritis and colitis, unspecified: Secondary | ICD-10-CM

## 2020-09-21 DIAGNOSIS — Q433 Congenital malformations of intestinal fixation: Secondary | ICD-10-CM

## 2020-09-21 DIAGNOSIS — R933 Abnormal findings on diagnostic imaging of other parts of digestive tract: Secondary | ICD-10-CM

## 2020-09-21 LAB — LIPID PANEL
Cholesterol: 177 mg/dL (ref 0–200)
HDL: 73.2 mg/dL (ref >39.00)
LDL Cholesterol: 89 mg/dL (ref 0–99)
NonHDL: 103.69
Total CHOL/HDL Ratio: 2
Triglycerides: 71 mg/dL (ref 0.0–149.0)
VLDL: 14.2 mg/dL (ref 0.0–40.0)

## 2020-09-21 LAB — COMPREHENSIVE METABOLIC PANEL
ALT: 20 U/L (ref 0–35)
AST: 21 U/L (ref 0–37)
Albumin: 4.2 g/dL (ref 3.5–5.2)
Alkaline Phosphatase: 61 U/L (ref 39–117)
BUN: 13 mg/dL (ref 6–23)
CO2: 29 mEq/L (ref 19–32)
Calcium: 9.7 mg/dL (ref 8.4–10.5)
Chloride: 102 mEq/L (ref 96–112)
Creatinine, Ser: 0.84 mg/dL (ref 0.40–1.20)
GFR: 70.07 mL/min (ref >60.00)
Glucose, Bld: 78 mg/dL (ref 70–99)
Potassium: 4.1 mEq/L (ref 3.5–5.1)
Sodium: 140 mEq/L (ref 135–145)
Total Bilirubin: 0.5 mg/dL (ref 0.2–1.2)
Total Protein: 7.1 g/dL (ref 6.0–8.3)

## 2020-09-21 LAB — CBC WITH DIFFERENTIAL/PLATELET
Basophils Absolute: 0 10*3/uL (ref 0.0–0.1)
Basophils Relative: 0.9 % (ref 0.0–3.0)
Eosinophils Absolute: 0.1 10*3/uL (ref 0.0–0.7)
Eosinophils Relative: 1.6 % (ref 0.0–5.0)
HCT: 38.8 % (ref 36.0–46.0)
Hemoglobin: 13 g/dL (ref 12.0–15.0)
Lymphocytes Relative: 23.1 % (ref 12.0–46.0)
Lymphs Abs: 1 10*3/uL (ref 0.7–4.0)
MCHC: 33.4 g/dL (ref 30.0–36.0)
MCV: 91.9 fl (ref 78.0–100.0)
Monocytes Absolute: 0.3 10*3/uL (ref 0.1–1.0)
Monocytes Relative: 7.3 % (ref 3.0–12.0)
Neutro Abs: 2.9 10*3/uL (ref 1.4–7.7)
Neutrophils Relative %: 67.1 % (ref 43.0–77.0)
Platelets: 256 10*3/uL (ref 150.0–400.0)
RBC: 4.22 Mil/uL (ref 3.87–5.11)
RDW: 13.3 % (ref 11.5–15.5)
WBC: 4.3 10*3/uL (ref 4.0–10.5)

## 2020-09-21 LAB — TSH: TSH: 2.35 u[IU]/mL (ref 0.35–4.50)

## 2020-09-21 LAB — VITAMIN D 25 HYDROXY (VIT D DEFICIENCY, FRACTURES): VITD: 54.47 ng/mL (ref 30.00–100.00)

## 2020-09-21 MED ORDER — MELOXICAM 15 MG PO TABS: 15.0000 mg | ORAL_TABLET | Freq: Every day | ORAL | 3 refills | Status: DC | PRN

## 2020-09-21 MED ORDER — LOVASTATIN 40 MG PO TABS: 40.0000 mg | ORAL_TABLET | Freq: Every day | ORAL | 3 refills | Status: DC

## 2020-09-21 MED ORDER — PAROXETINE HCL 20 MG PO TABS: 20.0000 mg | ORAL_TABLET | Freq: Every day | ORAL | 3 refills | Status: DC

## 2020-09-21 MED ORDER — ESTRADIOL 0.1 MG/GM VA CREA: TOPICAL_CREAM | VAGINAL | 3 refills | Status: DC

## 2020-09-21 MED ORDER — OMEPRAZOLE 40 MG PO CPDR: 40.0000 mg | DELAYED_RELEASE_CAPSULE | Freq: Every day | ORAL | 3 refills | Status: DC

## 2020-09-21 MED ORDER — LEVOTHYROXINE SODIUM 88 MCG PO TABS: 88.0000 ug | ORAL_TABLET | Freq: Every day | ORAL | 3 refills | Status: DC

## 2020-09-21 MED ORDER — VALACYCLOVIR HCL 500 MG PO TABS: ORAL_TABLET | ORAL | 3 refills | Status: DC

## 2020-09-21 NOTE — Patient Instructions (Addendum)
Align probiotics over the [employer] dairy for now  Optometrist Xray mid back and Dr. Allen Norris f/u     Irritable Bowel Syndrome, Adult  Irritable bowel syndrome (IBS) is a group of symptoms that affects the organs responsible for digestion (gastrointestinal or GI tract). IBS is not one specific disease. To regulate how the GI tract works, the body sends signals back and forth between the intestines and the brain. If you have IBS, there may be a problem with these signals. As a result, the GI tract does not function normally. The intestines may become more sensitive and overreact to certain things. This may be especially true when you eat certain foods or when you are under stress. There are four types of IBS. These may be determined based on the consistency of your stool (feces):  IBS with diarrhea.  IBS with constipation.  Mixed IBS.  Unsubtyped IBS. It is important to know which type of IBS you have. Certain treatments are more likely to be helpful for certain types of IBS. What are the causes? The exact cause of IBS is not known. What increases the risk? You may have a higher risk for IBS if you:  Are female.  Are younger than 74.  Have a family history of IBS.  Have a mental health condition, such as depression, anxiety, or post-traumatic stress disorder.  Have had a bacterial infection of your GI tract. What are the signs or symptoms? Symptoms of IBS vary from person to person. The main symptom is abdominal pain or discomfort. Other symptoms usually include one or more of the following:  Diarrhea, constipation, or both.  Abdominal swelling or bloating.  Feeling full after eating a small or regular-sized meal.  Frequent gas.  Mucus in the stool.  A feeling of having more stool left after a bowel movement. Symptoms tend to come and go. They may be triggered by stress, mental health conditions, or certain foods. How is this  diagnosed? This condition may be diagnosed based on a physical exam, your medical history, and your symptoms. You may have tests, such as:  Blood tests.  Stool test.  X-rays.  CT scan.  Colonoscopy. This is a procedure in which your GI tract is viewed with a long, thin, flexible tube. How is this treated? There is no cure for IBS, but treatment can help relieve symptoms. Treatment depends on the type of IBS you have, and may include:  Changes to your diet, such as: ? Avoiding foods that cause symptoms. ? Drinking more water. ? Following a low-FODMAP (fermentable oligosaccharides, disaccharides, monosaccharides, and polyols) diet for up to 6 weeks, or as told by your health care provider. FODMAPs are sugars that are hard for some people to digest. ? Eating more fiber. ? Eating medium-sized meals at the same times every day.  Medicines. These may include: ? Fiber supplements, if you have constipation. ? Medicine to control diarrhea (antidiarrheal medicines). ? Medicine to help control muscle tightening (spasms) in your GI tract (antispasmodic medicines). ? Medicines to help with mental health conditions, such as antidepressants or tranquilizers.  Talk therapy or counseling.  Working with a diet and nutrition specialist (dietitian) to help create a food plan that is right for you.  Managing your stress. Follow these instructions at home: Eating and drinking  Eat a healthy diet.  Eat medium-sized meals at about the same time every day. Do not eat large meals.  Gradually eat more  fiber-rich foods. These include whole grains, fruits, and vegetables. This may be especially helpful if you have IBS with constipation.  Eat a diet low in FODMAPs.  Drink enough fluid to keep your urine pale yellow.  Keep a journal of foods that seem to trigger symptoms.  Avoid foods and drinks that: ? Contain added sugar. ? Make your symptoms worse. Dairy products, caffeinated drinks, and  carbonated drinks can make symptoms worse for some people. General instructions  Take over-the-counter and prescription medicines and supplements only as told by your health care provider.  Get enough exercise. Do at least 150 minutes of moderate-intensity exercise each week.  Manage your stress. Getting enough sleep and exercise can help you manage stress.  Keep all follow-up visits as told by your health care provider and therapist. This is important. Alcohol Use  Do not drink alcohol if: ? Your health care provider tells you not to drink. ? You are pregnant, may be pregnant, or are planning to become pregnant.  If you drink alcohol, limit how much you have: ? 0-1 drink a day for women. ? 0-2 drinks a day for men.  Be aware of how much alcohol is in your drink. In the [place] one drink equals one typical bottle of beer (12 oz), one-half glass of wine (5 oz), or one shot of hard liquor (1 oz). Contact a health care provider if you have:  Constant pain.  Weight loss.  Difficulty or pain when swallowing.  Diarrhea that gets worse. Get help right away if you have:  Severe abdominal pain.  Fever.  Diarrhea with symptoms of dehydration, such as dizziness or dry mouth.  Bright red blood in your stool.  Stool that is black and tarry.  Abdominal swelling.  Vomiting that does not stop.  Blood in your vomit. Summary  Irritable bowel syndrome (IBS) is not one specific disease. It is a group of symptoms that affects digestion.  Your intestines may become more sensitive and overreact to certain things. This may be especially true when you eat certain foods or when you are under stress.  There is no cure for IBS, but treatment can help relieve symptoms. This information is not intended to replace advice given to you by your health care provider. Make sure you discuss any questions you have with your health care provider. Document Revised: 01/20/2020 Document Reviewed:  01/20/2020 Elsevier Patient Education  [place].  Diet for Irritable Bowel Syndrome When you have irritable bowel syndrome (IBS), it is very important to eat the foods and follow the eating habits that are best for your condition. IBS may cause various symptoms such as pain in the abdomen, constipation, or diarrhea. Choosing the right foods can help to ease the discomfort from these symptoms. Work with your health care provider and diet and nutrition specialist (dietitian) to find the eating plan that will help to control your symptoms. What are tips for following this plan?  Keep a food diary. This will help you identify foods that cause symptoms. Write down: ? What you eat and when you eat it. ? What symptoms you have. ? When symptoms occur in relation to your meals, such as "pain in abdomen 2 hours after dinner."  Eat your meals slowly and in a relaxed setting.  Aim to eat 5-6 small meals per day. Do not skip meals.  Drink enough fluid to keep your urine pale yellow.  Ask your health care provider if you should take an over-the-counter probiotic  to help restore healthy bacteria in your gut (digestive tract). ? Probiotics are foods that contain good bacteria and yeasts.  Your dietitian may have specific dietary recommendations for you based on your symptoms. He or she may recommend that you: ? Avoid foods that cause symptoms. Talk with your dietitian about other ways to get the same nutrients that are in those problem foods. ? Avoid foods with gluten. Gluten is a protein that is found in rye, wheat, and barley. ? Eat more foods that contain soluble fiber. Examples of foods with high soluble fiber include oats, seeds, and certain fruits and vegetables. Take a fiber supplement if directed by your dietitian. ? Reduce or avoid certain foods called FODMAPs. These are foods that contain carbohydrates that are hard to digest. Ask your doctor which foods contain these carbohydrates.       What foods are not recommended? The following are some foods and drinks that may make your symptoms worse:  Fatty foods, such as french fries.  Foods that contain gluten, such as pasta and cereal.  Dairy products, such as milk, cheese, and ice cream.  Chocolate.  Alcohol.  Products with caffeine, such as coffee.  Carbonated drinks, such as soda.  Foods that are high in FODMAPs. These include certain fruits and vegetables.  Products with sweeteners such as honey, high fructose corn syrup, sorbitol, and mannitol. The items listed above may not be a complete list of foods and beverages you should avoid. Contact a dietitian for more information.   What foods are good sources of fiber? Your health care provider or dietitian may recommend that you eat more foods that contain fiber. Fiber can help to reduce constipation and other IBS symptoms. Add foods with fiber to your diet a little at a time so your body can get used to them. Too much fiber at one time might cause gas and swelling of your abdomen. The following are some foods that are good sources of fiber:  Berries, such as raspberries, strawberries, and blueberries.  Tomatoes.  Carrots.  Brown rice.  Oats.  Seeds, such as chia and pumpkin seeds. The items listed above may not be a complete list of recommended sources of fiber. Contact your dietitian for more options. Where to find more information  International Foundation for Functional Gastrointestinal Disorders: www.iffgd.[employer] Diabetes and Digestive and Kidney Diseases: [employer] Summary  When you have irritable bowel syndrome (IBS), it is very important to eat the foods and follow the eating habits that are best for your condition.  IBS may cause various symptoms such as pain in the abdomen, constipation, or diarrhea.  Choosing the right foods can help to ease the discomfort that comes from symptoms.  Keep a food diary. This will  help you identify foods that cause symptoms.  Your health care provider or diet and nutrition specialist (dietitian) may recommend that you eat more foods that contain fiber. This information is not intended to replace advice given to you by your health care provider. Make sure you discuss any questions you have with your health care provider. Document Revised: 01/20/2020 Document Reviewed: 01/20/2020 Elsevier Patient Education  2021 [place] stands for fermentable oligosaccharides, disaccharides, monosaccharides, and polyols. These are sugars that are hard for some people to digest. A low-FODMAP eating plan may help some people who have irritable bowel syndrome (IBS) and certain other bowel (intestinal) diseases to manage their symptoms. This meal plan can be complicated  to follow. Work with a diet and nutrition specialist (dietitian) to make a low-FODMAP eating plan that is right for you. A dietitian can help make sure that you get enough nutrition from this diet. What are tips for following this plan? Reading food labels  Check labels for hidden FODMAPs such as: ? High-fructose syrup. ? Honey. ? Agave. ? Natural fruit flavors. ? Onion or garlic powder.  Choose low-FODMAP foods that contain 3-4 grams of fiber per serving.  Check food labels for serving sizes. Eat only one serving at a time to make sure FODMAP levels stay low. Shopping  Shop with a list of foods that are recommended on this diet and make a meal plan. Meal planning  Follow a low-FODMAP eating plan for up to 6 weeks, or as told by your health care provider or dietitian.  To follow the eating plan: 1. Eliminate high-FODMAP foods from your diet completely. Choose only low-FODMAP foods to eat. You will do this for 2-6 weeks. 2. Gradually reintroduce high-FODMAP foods into your diet one at a time. Most people should wait a few days before introducing the next new high-FODMAP food into  their meal plan. Your dietitian can recommend how quickly you may reintroduce foods. 3. Keep a daily record of what and how much you eat and drink. Make note of any symptoms that you have after eating. 4. Review your daily record with a dietitian regularly to identify which foods you can eat and which foods you should avoid. General tips  Drink enough fluid each day to keep your urine pale yellow.  Avoid processed foods. These often have added sugar and may be high in FODMAPs.  Avoid most dairy products, whole grains, and sweeteners.  Work with a dietitian to make sure you get enough fiber in your diet.  Avoid high FODMAP foods at meals to manage symptoms. Recommended foods Fruits Bananas, oranges, tangerines, lemons, limes, blueberries, raspberries, strawberries, grapes, cantaloupe, honeydew melon, kiwi, papaya, passion fruit, and pineapple. Limited amounts of dried cranberries, banana chips, and shredded coconut. Vegetables Eggplant, zucchini, cucumber, peppers, green beans, bean sprouts, lettuce, arugula, kale, Swiss chard, spinach, collard greens, bok choy, summer squash, potato, and tomato. Limited amounts of corn, carrot, and sweet potato. Green parts of scallions. Grains Gluten-free grains, such as rice, oats, buckwheat, quinoa, corn, polenta, and millet. Gluten-free pasta, bread, or cereal. Rice noodles. Corn tortillas. Meats and other proteins Unseasoned beef, pork, poultry, or fish. Eggs. Berniece Salines. Tofu (firm) and tempeh. Limited amounts of nuts and seeds, such as almonds, walnuts, [place] nuts, pecans, peanuts, nut butters, pumpkin seeds, chia seeds, and sunflower seeds. Dairy Lactose-free milk, yogurt, and kefir. Lactose-free cottage cheese and ice cream. Non-dairy milks, such as almond, coconut, hemp, and rice milk. Non-dairy yogurt. Limited amounts of goat cheese, brie, mozzarella, parmesan, swiss, and other hard cheeses. Fats and oils Butter-free spreads. Vegetable oils, such as  olive, canola, and sunflower oil. Seasoning and other foods Artificial sweeteners with names that do not end in "ol," such as aspartame, saccharine, and stevia. Maple syrup, white table sugar, raw sugar, brown sugar, and molasses. Mayonnaise, soy sauce, and tamari. Fresh basil, coriander, parsley, rosemary, and thyme. Beverages Water and mineral water. Sugar-sweetened soft drinks. Small amounts of orange juice or cranberry juice. Black and green tea. Most dry wines. Coffee. The items listed above may not be a complete list of foods and beverages you can eat. Contact a dietitian for more information. Foods to avoid Fruits Fresh, dried, and juiced forms  of apple, pear, watermelon, peach, plum, cherries, apricots, blackberries, boysenberries, figs, nectarines, and mango. Avocado. Vegetables Chicory root, artichoke, asparagus, cabbage, snow peas, Brussels sprouts, broccoli, sugar snap peas, mushrooms, celery, and cauliflower. Onions, garlic, leeks, and the white part of scallions. Grains Wheat, including kamut, durum, and semolina. Barley and bulgur. Couscous. Wheat-based cereals. Wheat noodles, bread, crackers, and pastries. Meats and other proteins Fried or fatty meat. Sausage. Cashews and pistachios. Soybeans, baked beans, black beans, chickpeas, kidney beans, fava beans, navy beans, lentils, black-eyed peas, and split peas. Dairy Milk, yogurt, ice cream, and soft cheese. Cream and sour cream. Milk-based sauces. Custard. Buttermilk. Soy milk. Seasoning and other foods Any sugar-free gum or candy. Foods that contain artificial sweeteners such as sorbitol, mannitol, isomalt, or xylitol. Foods that contain honey, high-fructose corn syrup, or agave. Bouillon, vegetable stock, beef stock, and chicken stock. Garlic and onion powder. Condiments made with onion, such as hummus, chutney, pickles, relish, salad dressing, and salsa. Tomato paste. Beverages Chicory-based drinks. Coffee substitutes. Chamomile  tea. Fennel tea. Sweet or fortified wines such as port or sherry. Diet soft drinks made with isomalt, mannitol, maltitol, sorbitol, or xylitol. Apple, pear, and mango juice. Juices with high-fructose corn syrup. The items listed above may not be a complete list of foods and beverages you should avoid. Contact a dietitian for more information. Summary  FODMAP stands for fermentable oligosaccharides, disaccharides, monosaccharides, and polyols. These are sugars that are hard for some people to digest.  A low-FODMAP eating plan is a short-term diet that helps to ease symptoms of certain bowel diseases.  The eating plan usually lasts up to 6 weeks. After that, high-FODMAP foods are reintroduced gradually and one at a time. This can help you find out which foods may be causing symptoms.  A low-FODMAP eating plan can be complicated. It is best to work with a dietitian who has experience with this type of plan. This information is not intended to replace advice given to you by your health care provider. Make sure you discuss any questions you have with your health care provider. Document Revised: 10/07/2019 Document Reviewed: 10/07/2019 Elsevier Patient Education  [place].   Diarrhea, Adult Diarrhea is frequent loose and watery bowel movements. Diarrhea can make you feel weak and cause you to become dehydrated. Dehydration can make you tired and thirsty, cause you to have a dry mouth, and decrease how often you urinate. Diarrhea typically lasts 2-3 days. However, it can last longer if it is a sign of something more serious. It is important to treat your diarrhea as told by your health care provider. Follow these instructions at home: Eating and drinking Follow these recommendations as told by your health care provider:  Take an oral rehydration solution (ORS). This is an over-the-counter medicine that helps return your body to its normal balance of nutrients and water. It is found at  pharmacies and retail stores.  Drink plenty of fluids, such as water, ice chips, diluted fruit juice, and low-calorie sports drinks. You can drink milk also, if desired.  Avoid drinking fluids that contain a lot of sugar or caffeine, such as energy drinks, sports drinks, and soda.  Eat bland, easy-to-digest foods in small amounts as you are able. These foods include bananas, applesauce, rice, lean meats, toast, and crackers.  Avoid alcohol.  Avoid spicy or fatty foods.      Medicines  Take over-the-counter and prescription medicines only as told by your health care provider.  If you were prescribed  an antibiotic medicine, take it as told by your health care provider. Do not stop using the antibiotic even if you start to feel better. General instructions  Wash your hands often using soap and water. If soap and water are not available, use a hand sanitizer. Others in the household should wash their hands as well. Hands should be washed: ? After using the toilet or changing a diaper. ? Before preparing, cooking, or serving food. ? While caring for a sick person or while visiting someone in a hospital.  Drink enough fluid to keep your urine pale yellow.  Rest at home while you recover.  Watch your condition for any changes.  Take a warm bath to relieve any burning or pain from frequent diarrhea episodes.  Keep all follow-up visits as told by your health care provider. This is important.   Contact a health care provider if:  You have a fever.  Your diarrhea gets worse.  You have new symptoms.  You cannot keep fluids down.  You feel light-headed or dizzy.  You have a headache.  You have muscle cramps. Get help right away if:  You have chest pain.  You feel extremely weak or you faint.  You have bloody or black stools or stools that look like tar.  You have severe pain, cramping, or bloating in your abdomen.  You have trouble breathing or you are breathing very  quickly.  Your heart is beating very quickly.  Your skin feels cold and clammy.  You feel confused.  You have signs of dehydration, such as: ? Dark urine, very little urine, or no urine. ? Cracked lips. ? Dry mouth. ? Sunken eyes. ? Sleepiness. ? Weakness. Summary  Diarrhea is frequent loose and watery bowel movements. Diarrhea can make you feel weak and cause you to become dehydrated.  Drink enough fluids to keep your urine pale yellow.  Make sure that you wash your hands after using the toilet. If soap and water are not available, use hand sanitizer.  Contact a health care provider if your diarrhea gets worse or you have new symptoms.  Get help right away if you have signs of dehydration. This information is not intended to replace advice given to you by your health care provider. Make sure you discuss any questions you have with your health care provider. Document Revised: 10/06/2018 Document Reviewed: 10/24/2017 Elsevier Patient Education  2021 [place] Diet A bland diet consists of foods that are often soft and do not have a lot of fat, fiber, or extra seasonings. Foods without fat, fiber, or seasoning are easier for the body to digest. They are also less likely to irritate your mouth, throat, stomach, and other parts of your digestive system. A bland diet is sometimes called a BRAT diet. What is my plan? Your health care provider or food and nutrition specialist (dietitian) may recommend specific changes to your diet to prevent symptoms or to treat your symptoms. These changes may include:  Eating small meals often.  Cooking food until it is soft enough to chew easily.  Chewing your food well.  Drinking fluids slowly.  Not eating foods that are very spicy, sour, or fatty.  Not eating citrus fruits, such as oranges and grapefruit. What do I need to know about this diet?  Eat a variety of foods from the bland diet food list.  Do not follow a bland  diet longer than needed.  Ask your health care provider whether you should  take vitamins or supplements. What foods can I eat? Grains Hot cereals, such as cream of wheat. Rice. Bread, crackers, or tortillas made from refined white flour.   Vegetables Canned or cooked vegetables. Mashed or boiled potatoes. Fruits Bananas. Applesauce. Other types of cooked or canned fruit with the skin and seeds removed, such as canned peaches or pears.   Meats and other proteins Scrambled eggs. Creamy peanut butter or other nut butters. Lean, well-cooked meats, such as chicken or fish. Tofu. Soups or broths.   Dairy Low-fat dairy products, such as milk, cottage cheese, or yogurt. Beverages Water. Herbal tea. Apple juice.   Fats and oils Mild salad dressings. Canola or olive oil. Sweets and desserts Pudding. Custard. Fruit gelatin. Ice cream. The items listed above may not be a complete list of recommended foods and beverages. Contact a dietitian for more options. What foods are not recommended? Grains Whole grain breads and cereals. Vegetables Raw vegetables. Fruits Raw fruits, especially citrus, berries, or dried fruits. Dairy Whole fat dairy foods. Beverages Caffeinated drinks. Alcohol. Seasonings and condiments Strongly flavored seasonings or condiments. Hot sauce. Salsa. Other foods Spicy foods. Fried foods. Sour foods, such as pickled or fermented foods. Foods with high sugar content. Foods high in fiber. The items listed above may not be a complete list of foods and beverages to avoid. Contact a dietitian for more information. Summary  A bland diet consists of foods that are often soft and do not have a lot of fat, fiber, or extra seasonings.  Foods without fat, fiber, or seasoning are easier for the body to digest.  Check with your health care provider to see how long you should follow this diet plan. It is not meant to be followed for long periods. This information is not intended  to replace advice given to you by your health care provider. Make sure you discuss any questions you have with your health care provider. Document Revised: 06/18/2017 Document Reviewed: 06/18/2017 Elsevier Patient Education  2021 [employer].

## 2020-09-21 NOTE — Addendum Note (Signed)
Addended by: Leeanne Rio on: 09/21/2020 11:14 AM   Modules accepted: Orders

## 2020-09-21 NOTE — Progress Notes (Signed)
Chief Complaint  Patient presents with  . Diarrhea  . Back Pain    Upper mid back pain   F/u  1. 2 weeks ago had mid upper back pain with diarrhea at most 12-13 foul stools diarrhea x 4-5 days then down to 5x per day w/in the last 2 weeks she does miralax 1-2 x per day but stopped during 2 weeks of diarrhea. Diarrhea stopped 09/19/20 and she had generalized ab pain and chronic constipation and resumed miralax 4/20 or 09/21/20 diarrhea was watery, explosive and she had stool incontinence during the night no n/v/loss of appetite wt loss  [place] 04/03/20 neg Gallstones  Of note not had covid vaccine   Review of Systems  Constitutional: Negative for fever and weight loss.  HENT: Negative for hearing loss.   Eyes: Negative for blurred vision.  Respiratory: Negative for shortness of breath.   Cardiovascular: Negative for chest pain.  Gastrointestinal: Positive for abdominal pain, constipation and diarrhea. Negative for blood in stool, nausea and vomiting.  Musculoskeletal: Positive for back pain.  Skin: Negative for rash.  Neurological: Negative for headaches.  Psychiatric/Behavioral: Negative for depression and memory loss.   Past Medical History:  Diagnosis Date  . Allergy   . Anti-cardiolipin antibody positive    Dr. Jefm Bryant   . Anxiety   . Arthritis   . BRCA negative 2016   MyRisk neg   . Depression   . Family history of ovarian cancer 2016   MyRisk neg; affected sister is BRCA neg  . GERD (gastroesophageal reflux disease)   . Herpes simplex   . High cholesterol   . Hypercholesteremia   . Hyperlipidemia   . Hypothyroidism   . Osteopenia    DEXA 09/02/13   . Rosacea   . Ruptured disc, cervical   . Thyroid disease   . Vertigo    per pt cervicogenic Physical therapy helped   . Vitamin D deficiency   . Wears dentures    full upper   Past Surgical History:  Procedure Laterality Date  . CERVICAL BIOPSY  W/ LOOP ELECTRODE EXCISION    . COLONOSCOPY    . COLONOSCOPY WITH PROPOFOL  N/A 03/30/2018   Procedure: COLONOSCOPY WITH Biopsies;  Surgeon: [name], MD;  Location: [facility];  Service: Endoscopy;  Laterality: N/A;  . DILATION AND CURETTAGE OF UTERUS  30 years ago  . EYE SURGERY     b/l cataract   . POLYPECTOMY N/A 03/30/2018   Procedure: POLYPECTOMY;  Surgeon: [name], MD;  Location: [facility];  Service: Endoscopy;  Laterality: N/A;   Family History  Problem Relation Age of Onset  . Cancer Mother        kidney met to lungs   . Diabetes Mother   . Thyroid disease Mother   . Hypertension Father   . Stroke Father   . Thyroid disease Sister   . Ovarian cancer Sister 39       BRCA neg  . Diabetes Daughter        type 1   . Colon cancer Maternal Aunt 32  . Heart disease Other   . Breast cancer Neg Hx    Social History   Socioeconomic History  . Marital status: Married    Spouse name: Not on file  . Number of children: Not on file  . Years of education: Not on file  . Highest education level: Not on file  Occupational History  . Not on file  Tobacco Use  .  Smoking status: Never Smoker  . Smokeless tobacco: Never Used  Vaping Use  . Vaping Use: Never used  Substance and Sexual Activity  . Alcohol use: No    Alcohol/week: 0.0 standard drinks  . Drug use: No  . Sexual activity: Yes    Birth control/protection: Post-menopausal  Other Topics Concern  . Not on file  Social History Narrative    [facility] retired as of 04/02/18    Social Determinants of Health   Financial Resource Strain: Low Risk   . Difficulty of Paying Living Expenses: Not hard at all  Food Insecurity: No Food Insecurity  . Worried About Charity fundraiser in the Last Year: Never true  . Ran Out of Food in the Last Year: Never true  Transportation Needs: No Transportation Needs  . Lack of Transportation (Medical): No  . Lack of Transportation (Non-Medical): No  Physical Activity: Not on file  Stress: No Stress Concern Present  .  Feeling of Stress : Not at all  Social Connections: Unknown  . Frequency of Communication with Friends and Family: More than three times a week  . Frequency of Social Gatherings with Friends and Family: More than three times a week  . Attends Religious Services: Not on file  . Active Member of Clubs or Organizations: Not on file  . Attends Archivist Meetings: Not on file  . Marital Status: Not on file  Intimate Partner Violence: Not At Risk  . Fear of Current or Ex-Partner: No  . Emotionally Abused: No  . Physically Abused: No  . Sexually Abused: No   Current Meds  Medication Sig  . Ascorbic Acid (VITAMIN C) 500 MG CHEW   . aspirin 81 MG tablet Take 81 mg by mouth daily.  Marland Kitchen BIOTIN PO Take 1 tablet by mouth daily.  . Calcium 500 MG tablet Take 500 mg by mouth in the morning and at bedtime.  . Cholecalciferol 1000 units tablet Take 1 tablet (1,000 Units total) by mouth daily. (Patient taking differently: Take 2,000 Units by mouth daily.)  . magnesium chloride (SLOW-MAG) 64 MG TBEC SR tablet Take 1 tablet by mouth.  . Meclizine HCl (ANTIVERT PO) Take by mouth as needed.  . Multiple Vitamins-Minerals (MULTIVITAMIN ADULT PO)   . polyethylene glycol powder (GLYCOLAX/MIRALAX) powder Take 17 g by mouth daily as needed.  . [DISCONTINUED] estradiol (ESTRACE) 0.1 MG/GM vaginal cream Insert 1 g vaginally once weekly  . [DISCONTINUED] levothyroxine (SYNTHROID) 88 MCG tablet Take 1 tablet (88 mcg total) by mouth daily before breakfast. Empty stomach  . [DISCONTINUED] lovastatin (MEVACOR) 40 MG tablet Take 1 tablet (40 mg total) by mouth at bedtime.  . [DISCONTINUED] meloxicam (MOBIC) 15 MG tablet Take 1 tablet (15 mg total) by mouth daily as needed for pain.  . [DISCONTINUED] omeprazole (PRILOSEC) 40 MG capsule Take 1 capsule (40 mg total) by mouth daily. Wait 3-4 hours after thyroid med. Take 30 minutes before food lunch/dinner  . [DISCONTINUED] PARoxetine (PAXIL) 20 MG tablet TAKE 1  TABLET BY MOUTH EVERY DAY  . [DISCONTINUED] valACYclovir (VALTREX) 500 MG tablet Take 1 tab daily as preventive or BID for 3 days prn sx flare  . [DISCONTINUED] vitamin B-12 (CYANOCOBALAMIN) 500 MCG tablet Take 500 mcg by mouth daily.   Allergies  Allergen Reactions  . Penicillin V Potassium Anaphylaxis  . Penicillins Anaphylaxis  . Tetanus Toxoid Nausea And Vomiting   No results found for this or any previous visit (from the past 2160  hour(s)). Objective  Body mass index is 22.5 kg/m. Wt Readings from Last 3 Encounters:  09/21/20 127 lb (57.6 kg)  09/18/20 122 lb (55.3 kg)  10/05/19 122 lb (55.3 kg)   Temp Readings from Last 3 Encounters:  09/21/20 (!) 97.4 F (36.3 C)  10/05/19 (!) 97.3 F (36.3 C) (Temporal)  04/02/18 98.1 F (36.7 C) (Oral)   BP Readings from Last 3 Encounters:  09/21/20 124/70  10/05/19 106/70  06/15/19 100/70   Pulse Readings from Last 3 Encounters:  09/21/20 66  10/05/19 72  04/02/18 70    Physical Exam Vitals and nursing note reviewed.  Constitutional:      Appearance: Normal appearance. She is well-developed and well-groomed.  HENT:     Head: Normocephalic and atraumatic.  Cardiovascular:     Rate and Rhythm: Normal rate and regular rhythm.     Heart sounds: Normal heart sounds. No murmur heard.   Pulmonary:     Effort: Pulmonary effort is normal.     Breath sounds: Normal breath sounds.  Abdominal:     Tenderness: There is no abdominal tenderness.  Skin:    General: Skin is warm and dry.  Neurological:     General: No focal deficit present.     Mental Status: She is alert and oriented to person, place, and time. Mental status is at baseline.     Gait: Gait normal.  Psychiatric:        Attention and Perception: Attention and perception normal.        Mood and Affect: Mood and affect normal.        Speech: Speech normal.        Behavior: Behavior normal. Behavior is cooperative.        Thought Content: Thought content normal.         Cognition and Memory: Cognition and memory normal.        Judgment: Judgment normal.     Assessment  Plan  Generalized abdominal pain - Plan: CT Abdomen Pelvis W Contrast Consider GI consult Dr. Allen Norris and for foul stool and alt bowel habits   Chronic constipation - Plan: Urinalysis, Routine w reflex microscopic, CT Abdomen Pelvis W Contrast, Comprehensive metabolic panel, CBC with Differential/Platelet, CANCELED: Comprehensive metabolic panel, CANCELED: CBC with Differential/Platelet  Diarrhea, unspecified type - Plan: Urinalysis, Routine w reflex microscopic, CT Abdomen Pelvis W Contrast, Comprehensive metabolic panel, CBC with Differential/Platelet, CANCELED: Comprehensive metabolic panel, CANCELED: CBC with Differential/Platelet  Strong odor of stools ? SBO- Plan: Urinalysis, Routine w reflex microscopic, CT Abdomen Pelvis W Contrast, Comprehensive metabolic panel, CBC with Differential/Platelet, CANCELED: Comprehensive metabolic panel, CANCELED: CBC with Differential/Platelet  Exposure to COVID-19 virus - Plan: SARS-CoV-2 Semi-Quantitative Total Antibody, Spike  Hypothyroidism, unspecified type - Plan: TSH, cont levo 88 mcg qd  Vitamin D deficiency - Plan: Vitamin D (25 hydroxy)  Postmenopausal atrophic vaginitis - Sx improved wtih estrace crm. Rx RF. Coupon card.  - Plan: estradiol (ESTRACE) 0.1 MG/GM vaginal cream  Herpes simplex vulvovaginitis - Plan: valACYclovir (VALTREX) 500 MG tablet  Depression, major, recurrent, in complete remission ([place]) - Plan: PARoxetine (PAXIL) 20 MG tablet  Gastroesophageal reflux disease - Plan: omeprazole (PRILOSEC) 40 MG capsule  Chronic neck and back pain - Plan: meloxicam (MOBIC) 15 MG tablet  Mid back pain  -consider Xray mid back   HM Flu shot utd  utd prevnarand pna 23 utd Allergic to Tdap Had3/3 hep B vaccinescheck titer Had zostavax but disc shingrixutd 02/14/20 had 2nd shot  Not had covid 19 shot   mammo3/29/22  negreferred  Last pap [facility]2017 will get records at f/u of note had genetic testing ovarian cancer with OB/GYN neg -pap 03/23/15 neg pap HPV neg Dr. Laurey Morale -appt [facility] pap 2/87/68 negative f/u Q2 years   colonoscopy10/28/19 2 4-6 mm polpys diverticulosis tubular and sessile serrated Dr. Allen Norris f/u in 3-5 years  DEXA had 08/23/19 osteopenia  Dermatology appt 2/[address] Normal exam 05/2018 exam -petechie to legs vitamin C started ? If were a vasculitis F/u 07/2020  Never smoker, no etoh, drugs  Eye MD   Provider: Dr. Olivia Mackie McLean-Scocuzza-Internal Medicine

## 2020-09-22 LAB — URINALYSIS, ROUTINE W REFLEX MICROSCOPIC
Bilirubin, UA: NEGATIVE
Glucose, UA: NEGATIVE
Ketones, UA: NEGATIVE
Leukocytes,UA: NEGATIVE
Nitrite, UA: NEGATIVE
Protein,UA: NEGATIVE
RBC, UA: NEGATIVE
Specific Gravity, UA: 1.009 (ref 1.005–1.030)
Urobilinogen, Ur: 0.2 mg/dL (ref 0.2–1.0)
pH, UA: 5.5 (ref 5.0–7.5)

## 2020-09-22 LAB — SARS-COV-2 SEMI-QUANTITATIVE TOTAL ANTIBODY, SPIKE
SARS-CoV-2 Semi-Quant Total Ab: <0.8 U/mL (ref <0.8)
SARS-CoV-2 Spike Ab Interp: NEGATIVE

## 2020-09-29 ENCOUNTER — Telehealth: Payer: Self-pay | Admitting: Internal Medicine

## 2020-09-29 NOTE — Telephone Encounter (Signed)
Faxed and signed sent to [facility] prescriber response form  On 09-27-20 [phone]

## 2020-10-03 ENCOUNTER — Ambulatory Visit
Admission: RE | Admit: 2020-10-03 | Discharge: 2020-10-03 | Disposition: A | Discharge status: Home / Self Care | Payer: Medicare Other | Source: Ambulatory Visit | Attending: Internal Medicine | Admitting: Internal Medicine

## 2020-10-03 ENCOUNTER — Other Ambulatory Visit: Payer: Self-pay

## 2020-10-03 DIAGNOSIS — R1084 Generalized abdominal pain: Secondary | ICD-10-CM | POA: Insufficient documentation

## 2020-10-03 DIAGNOSIS — R197 Diarrhea, unspecified: Secondary | ICD-10-CM | POA: Diagnosis not present

## 2020-10-03 DIAGNOSIS — R195 Other fecal abnormalities: Secondary | ICD-10-CM | POA: Insufficient documentation

## 2020-10-03 DIAGNOSIS — R109 Unspecified abdominal pain: Secondary | ICD-10-CM | POA: Diagnosis not present

## 2020-10-03 DIAGNOSIS — K5909 Other constipation: Secondary | ICD-10-CM | POA: Diagnosis not present

## 2020-10-03 MED ORDER — IOHEXOL 300 MG/ML  SOLN
100.0000 mL | Freq: Once | INTRAMUSCULAR | Status: AC | PRN
  Administered 2020-10-03: 100 mL via INTRAVENOUS

## 2020-10-05 ENCOUNTER — Ambulatory Visit: Payer: Medicare Other | Admitting: Internal Medicine

## 2020-10-05 ENCOUNTER — Encounter: Payer: Self-pay | Admitting: Internal Medicine

## 2020-10-05 DIAGNOSIS — I700 Atherosclerosis of aorta: Secondary | ICD-10-CM | POA: Insufficient documentation

## 2020-10-05 DIAGNOSIS — K6289 Other specified diseases of anus and rectum: Secondary | ICD-10-CM | POA: Insufficient documentation

## 2020-10-05 DIAGNOSIS — M5416 Radiculopathy, lumbar region: Secondary | ICD-10-CM | POA: Insufficient documentation

## 2020-10-05 DIAGNOSIS — Q433 Congenital malformations of intestinal fixation: Secondary | ICD-10-CM | POA: Insufficient documentation

## 2020-10-05 DIAGNOSIS — K529 Noninfective gastroenteritis and colitis, unspecified: Secondary | ICD-10-CM | POA: Insufficient documentation

## 2020-10-09 ENCOUNTER — Encounter: Payer: Self-pay | Admitting: Internal Medicine

## 2020-10-10 ENCOUNTER — Telehealth: Payer: Self-pay

## 2020-10-10 NOTE — Addendum Note (Signed)
Addended by: Orland Mustard on: 10/10/2020 08:48 AM   Modules accepted: Orders

## 2020-10-10 NOTE — Telephone Encounter (Signed)
See Patient message encounter for 10/05/20

## 2020-10-10 NOTE — Telephone Encounter (Signed)
Mccreadie, Christean Grief to McLean-Scocuzza, Nino Glow, MD      10/09/20 4:26 PM If you can please prioritize getting my GI appointment. I am now having some distention of my stomach after I eat and discomfort. Thank you so much.

## 2020-10-10 NOTE — Telephone Encounter (Signed)
Please attach the referral to the upcomming appt. Thx!

## 2020-10-11 ENCOUNTER — Ambulatory Visit (INDEPENDENT_AMBULATORY_CARE_PROVIDER_SITE_OTHER): Payer: Medicare Other | Admitting: Internal Medicine

## 2020-10-11 ENCOUNTER — Encounter: Payer: Self-pay | Admitting: Internal Medicine

## 2020-10-11 ENCOUNTER — Other Ambulatory Visit: Payer: Self-pay

## 2020-10-11 VITALS — BP 126/74 | HR 70 | Ht 63.0 in | Wt 127.0 lb

## 2020-10-11 DIAGNOSIS — E785 Hyperlipidemia, unspecified: Secondary | ICD-10-CM

## 2020-10-11 DIAGNOSIS — R072 Precordial pain: Secondary | ICD-10-CM | POA: Diagnosis not present

## 2020-10-11 DIAGNOSIS — R0600 Dyspnea, unspecified: Secondary | ICD-10-CM | POA: Diagnosis not present

## 2020-10-11 DIAGNOSIS — R0609 Other forms of dyspnea: Secondary | ICD-10-CM

## 2020-10-11 DIAGNOSIS — R0602 Shortness of breath: Secondary | ICD-10-CM

## 2020-10-11 NOTE — Patient Instructions (Addendum)
Medication Instructions:  - Your physician recommends that you continue on your current medications as directed. Please refer to the Current Medication list given to you today.  *If you need a refill on your cardiac medications before your next appointment, please call your pharmacy*   Lab Work: - none ordered  If you have labs (blood work) drawn today and your tests are completely normal, you will receive your results only by: Marland Kitchen MyChart Message (if you have MyChart) OR . A paper copy in the mail If you have any lab test that is abnormal or we need to change your treatment, we will call you to review the results.   Testing/Procedures: 1) Echocardiogram:  - Your physician has requested that you have an echocardiogram. Echocardiography is a painless test that uses sound waves to create images of your heart. It provides your doctor with information about the size and shape of your heart and how well your heart's chambers and valves are working. This procedure takes approximately one hour. There are no restrictions for this procedure. There is a possibility that an IV may need to be started during your test to inject an image enhancing agent. This is done to obtain more optimal pictures of your heart. Therefore we ask that you do at least drink some water prior to coming in to hydrate your veins.    2) Exercise Myoview (Cardiac Nuclear Scan)  - Your physician has requested that you have an exercise stress myoview.   [place]  Your caregiver has ordered a Stress Test with nuclear imaging. The purpose of this test is to evaluate the blood supply to your heart muscle. This procedure is referred to as a "Non-Invasive Stress Test." This is because other than having an IV started in your vein, nothing is inserted or "invades" your body. Cardiac stress tests are done to find areas of poor blood flow to the heart by determining the extent of coronary artery disease (CAD). Some patients exercise on  a treadmill, which naturally increases the blood flow to your heart, while others who are  unable to walk on a treadmill due to physical limitations have a pharmacologic/chemical stress agent called Lexiscan . This medicine will mimic walking on a treadmill by temporarily increasing your coronary blood flow.   Please note: these test may take anywhere between 2-4 hours to complete  PLEASE REPORT TO [place] AT THE FIRST DESK WILL DIRECT YOU WHERE TO GO  Date of Procedure:_____________________________________  Arrival Time for Procedure:______________________________  Instructions regarding medication:    __x__:  You may take all of your regular morning medications the day of your test with enough water to get them down safely  PLEASE NOTIFY THE OFFICE AT LEAST 24 HOURS IN ADVANCE IF YOU ARE UNABLE TO [place].  [phone] AND  PLEASE NOTIFY NUCLEAR MEDICINE AT [facility] AT LEAST 24 HOURS IN ADVANCE IF YOU ARE UNABLE TO KEEP YOUR APPOINTMENT. [phone]  How to prepare for your Myoview test:  1. Do not eat or drink after midnight 2. No caffeine for 24 hours prior to test 3. No smoking 24 hours prior to test. 4. Your medication may be taken with water.  If your doctor stopped a medication because of this test, do not take that medication. 5. Ladies, please do not wear dresses.  Skirts or pants are appropriate. Please wear a short sleeve shirt. 6. No perfume, cologne or lotion. 7. Wear comfortable walking shoes. No heels!  Follow-Up: At [facility], you and your health needs are our priority.  As part of our continuing mission to provide you with exceptional heart care, we have created designated Provider Care Teams.  These Care Teams include your primary Cardiologist (physician) and Advanced Practice Providers (APPs -  Physician Assistants and Nurse Practitioners) who all work together to provide you with the care you need, when you need  it.  We recommend signing up for the patient portal called "MyChart".  Sign up information is provided on this After Visit Summary.  MyChart is used to connect with patients for Virtual Visits (Telemedicine).  Patients are able to view lab/test results, encounter notes, upcoming appointments, etc.  Non-urgent messages can be sent to your provider as well.   To learn more about what you can do with MyChart, go to [email or web address].    Your next appointment:   6-8 week(s)  The format for your next appointment:   In Person  Provider:   You may see Nelva Bush, MD or one of the following Advanced Practice Providers on your designated Care Team:    Murray Hodgkins, NP  Christell Faith, PA-C  Marrianne Mood, PA-C  Cadence Guernsey, [place]  Laurann Montana, NP    Other Instructions    Echocardiogram An echocardiogram is a test that uses sound waves (ultrasound) to produce images of the heart. Images from an echocardiogram can provide important information about:  Heart size and shape.  The size and thickness and movement of your heart's walls.  Heart muscle function and strength.  Heart valve function or if you have stenosis. Stenosis is when the heart valves are too narrow.  If blood is flowing backward through the heart valves (regurgitation).  A tumor or infectious growth around the heart valves.  Areas of heart muscle that are not working well because of poor blood flow or injury from a heart attack.  Aneurysm detection. An aneurysm is a weak or damaged part of an artery wall. The wall bulges out from the normal force of blood pumping through the body. Tell a health care provider about:  Any allergies you have.  All medicines you are taking, including vitamins, herbs, eye drops, creams, and over-the-counter medicines.  Any blood disorders you have.  Any surgeries you have had.  Any medical conditions you have.  Whether you are pregnant or may be  pregnant. What are the risks? Generally, this is a safe test. However, problems may occur, including an allergic reaction to dye (contrast) that may be used during the test. What happens before the test? No specific preparation is needed. You may eat and drink normally. What happens during the test?  You will take off your clothes from the waist up and put on a hospital gown.  Electrodes or electrocardiogram (ECG)patches may be placed on your chest. The electrodes or patches are then connected to a device that monitors your heart rate and rhythm.  You will lie down on a table for an ultrasound exam. A gel will be applied to your chest to help sound waves pass through your skin.  A handheld device, called a transducer, will be pressed against your chest and moved over your heart. The transducer produces sound waves that travel to your heart and bounce back (or "echo" back) to the transducer. These sound waves will be captured in real-time and changed into images of your heart that can be viewed on a video monitor. The images will be recorded on a computer and  reviewed by your health care provider.  You may be asked to change positions or hold your breath for a short time. This makes it easier to get different views or better views of your heart.  In some cases, you may receive contrast through an IV in one of your veins. This can improve the quality of the pictures from your heart. The procedure may vary among health care providers and hospitals.   What can I expect after the test? You may return to your normal, everyday life, including diet, activities, and medicines, unless your health care provider tells you not to do that. Follow these instructions at home:  It is up to you to get the results of your test. Ask your health care provider, or the department that is doing the test, when your results will be ready.  Keep all follow-up visits. This is important. Summary  An echocardiogram is  a test that uses sound waves (ultrasound) to produce images of the heart.  Images from an echocardiogram can provide important information about the size and shape of your heart, heart muscle function, heart valve function, and other possible heart problems.  You do not need to do anything to prepare before this test. You may eat and drink normally.  After the echocardiogram is completed, you may return to your normal, everyday life, unless your health care provider tells you not to do that. This information is not intended to replace advice given to you by your health care provider. Make sure you discuss any questions you have with your health care provider. Document Revised: 01/11/2020 Document Reviewed: 01/11/2020 Elsevier Patient Education  2021 [place].     Cardiac Nuclear Scan A cardiac nuclear scan is a test that is done to check the flow of blood to your heart. It is done when you are resting and when you are exercising. The test looks for problems such as:  Not enough blood reaching a portion of the heart.  The heart muscle not working as it should. You may need this test if:  You have heart disease.  You have had lab results that are not normal.  You have had heart surgery or a balloon procedure to open up blocked arteries (angioplasty).  You have chest pain.  You have shortness of breath. In this test, a special dye (tracer) is put into your bloodstream. The tracer will travel to your heart. A camera will then take pictures of your heart to see how the tracer moves through your heart. This test is usually done at a hospital and takes 2-4 hours. Tell a doctor about:  Any allergies you have.  All medicines you are taking, including vitamins, herbs, eye drops, creams, and over-the-counter medicines.  Any problems you or family members have had with anesthetic medicines.  Any blood disorders you have.  Any surgeries you have had.  Any medical conditions you  have.  Whether you are pregnant or may be pregnant. What are the risks? Generally, this is a safe test. However, problems may occur, such as:  Serious chest pain and heart attack. This is only a risk if the stress portion of the test is done.  Rapid heartbeat.  A feeling of warmth in your chest. This feeling usually does not last long.  Allergic reaction to the tracer. What happens before the test?  Ask your doctor about changing or stopping your normal medicines. This is important.  Follow instructions from your doctor about what you cannot eat  or drink.  Remove your jewelry on the day of the test. What happens during the test?  An IV tube will be inserted into one of your veins.  Your doctor will give you a small amount of tracer through the IV tube.  You will wait for 20-40 minutes while the tracer moves through your bloodstream.  Your heart will be monitored with an electrocardiogram (ECG).  You will lie down on an exam table.  Pictures of your heart will be taken for about 15-20 minutes.  You may also have a stress test. For this test, one of these things may be done: ? You will be asked to exercise on a treadmill or a stationary bike. ? You will be given medicines that will make your heart work harder. This is done if you are unable to exercise.  When blood flow to your heart has peaked, a tracer will again be given through the IV tube.  After 20-40 minutes, you will get back on the exam table. More pictures will be taken of your heart.  Depending on the tracer that is used, more pictures may need to be taken 3-4 hours later.  Your IV tube will be removed when the test is over. The test may vary among doctors and hospitals. What happens after the test?  Ask your doctor: ? Whether you can return to your normal schedule, including diet, activities, and medicines. ? Whether you should drink more fluids. This will help to remove the tracer from your body. Drink  enough fluid to keep your pee (urine) pale yellow.  Ask your doctor, or the department that is doing the test: ? When will my results be ready? ? How will I get my results? Summary  A cardiac nuclear scan is a test that is done to check the flow of blood to your heart.  Tell your doctor whether you are pregnant or may be pregnant.  Before the test, ask your doctor about changing or stopping your normal medicines. This is important.  Ask your doctor whether you can return to your normal activities. You may be asked to drink more fluids. This information is not intended to replace advice given to you by your health care provider. Make sure you discuss any questions you have with your health care provider. Document Revised: 09/09/2018 Document Reviewed: 11/03/2017 Elsevier Patient Education  [place].

## 2020-10-11 NOTE — Progress Notes (Signed)
 New Outpatient Visit Date: 10/11/2020  Referring Provider: McLean-Scocuzza, Tracy N, MD [address] [place],  [place] [address]  Chief Complaint: Shortness of breath and vague chest pain  HPI:  Sandra Horton is a 70 y.o. female who is being seen today for the evaluation of aortic atherosclerosis at the request of Dr. McLean-Scocuzza. She has a history of hyperlipidemia, depression, and anxiety.  Aortic atherosclerosis was incidentally noted on CT of the abdomen and pelvis for evaluation of abdominal pain last week.  Sandra Horton reports that for the last 1 to 1.5 years she has been experiencing exertional dyspnea that is most noticeable when climbing stairs or carrying her grandson uphill.  This seems to be getting gradually worse.  When playing with her grandchildren, she has also experienced occasional "vague" chest pain.  It is a pressure-like sensation in the left side of her chest that typically resolves after resting for 3 to 4 minutes.  She does not have any symptoms with ADLs or at rest.  She notes occasional palpitations, especially when tired at the end of the day.  She has not had any frank lightheadedness but notes chronic vertigo.  She has never passed out.  She has mild dependent edema from time to time in her legs.  Sandra Horton reports undergoing cardiac work-up in her 30s leading up to her diagnosis of thyroid disease.  She believes that a stress test performed at that time was normal.  --------------------------------------------------------------------------------------------------  Cardiovascular History & Procedures: Cardiovascular Problems:  Dyspnea on exertion and "vague" chest pain  Aortic atherosclerosis  Risk Factors:  Aortic atherosclerosis, hyperlipidemia, and age greater than 65  Cath/PCI:  None  CV Surgery:  None  EP Procedures and Devices:  None  Non-Invasive Evaluation(s):  None available.  Recent CV Pertinent Labs: Lab Results  Component Value  Date   CHOL 177 09/21/2020   CHOL 183 06/07/2019   HDL 73.20 09/21/2020   HDL 77 06/07/2019   LDLCALC 89 09/21/2020   LDLCALC 91 06/07/2019   TRIG 71.0 09/21/2020   CHOLHDL 2 09/21/2020   K 4.1 09/21/2020   BUN 13 09/21/2020   BUN 11 06/07/2019   CREATININE 0.84 09/21/2020    --------------------------------------------------------------------------------------------------  Past Medical History:  Diagnosis Date  . Allergy   . Anti-cardiolipin antibody positive    Dr. Kernodle   . Anxiety   . Arthritis   . BRCA negative 2016   MyRisk neg   . Depression   . Family history of ovarian cancer 2016   MyRisk neg; affected sister is BRCA neg  . GERD (gastroesophageal reflux disease)   . Herpes simplex   . High cholesterol   . Hypercholesteremia   . Hyperlipidemia   . Hypothyroidism   . Osteopenia    DEXA 09/02/13   . Rosacea   . Ruptured disc, cervical   . Thyroid disease   . Vertigo    per pt cervicogenic Physical therapy helped   . Vitamin D deficiency   . Wears dentures    full upper    Past Surgical History:  Procedure Laterality Date  . CERVICAL BIOPSY  W/ LOOP ELECTRODE EXCISION    . COLONOSCOPY    . COLONOSCOPY WITH PROPOFOL N/A 03/30/2018   Procedure: COLONOSCOPY WITH Biopsies;  Surgeon: Wohl, Darren, MD;  Location: [facility];  Service: Endoscopy;  Laterality: N/A;  . DILATION AND CURETTAGE OF UTERUS  30 years ago  . EYE SURGERY     b/l cataract   .   POLYPECTOMY N/A 03/30/2018   Procedure: POLYPECTOMY;  Surgeon: Wohl, Darren, MD;  Location: [facility];  Service: Endoscopy;  Laterality: N/A;    Current Meds  Medication Sig  . Ascorbic Acid (VITAMIN C) 500 MG CHEW   . aspirin 81 MG tablet Take 81 mg by mouth daily.  . Calcium 500 MG tablet Take 500 mg by mouth in the morning and at bedtime.  . estradiol (ESTRACE) 0.1 MG/GM vaginal cream Insert 1 g vaginally once weekly  . levothyroxine (SYNTHROID) 88 MCG tablet Take 1 tablet (88 mcg  total) by mouth daily before breakfast. Empty stomach  . lovastatin (MEVACOR) 40 MG tablet Take 1 tablet (40 mg total) by mouth at bedtime.  . magnesium chloride (SLOW-MAG) 64 MG TBEC SR tablet Take 1 tablet by mouth.  . Meclizine HCl (ANTIVERT PO) Take by mouth as needed.  . meloxicam (MOBIC) 15 MG tablet Take 1 tablet (15 mg total) by mouth daily as needed for pain.  . Multiple Vitamins-Minerals (MULTIVITAMIN ADULT PO)   . omeprazole (PRILOSEC) 40 MG capsule Take 1 capsule (40 mg total) by mouth daily. Wait 3-4 hours after thyroid med. Take 30 minutes before food lunch/dinner  . PARoxetine (PAXIL) 20 MG tablet Take 1 tablet (20 mg total) by mouth daily.  . polyethylene glycol powder (GLYCOLAX/MIRALAX) powder Take 17 g by mouth daily as needed.  . valACYclovir (VALTREX) 500 MG tablet Take 1 tab daily as preventive or BID for 3 days prn sx flare    Allergies: Penicillin v potassium, Penicillins, and Tetanus toxoid  Social History   Tobacco Use  . Smoking status: Never Smoker  . Smokeless tobacco: Never Used  Vaping Use  . Vaping Use: Never used  Substance Use Topics  . Alcohol use: No    Alcohol/week: 0.0 standard drinks  . Drug use: No    Family History  Problem Relation Age of Onset  . Cancer Mother        kidney met to lungs   . Diabetes Mother   . Thyroid disease Mother   . Hypertension Father   . Stroke Father   . Heart attack Father 60  . Thyroid disease Sister   . Ovarian cancer Sister 62       BRCA neg  . Diabetes Daughter        type 1   . Colon cancer Maternal Aunt 55  . Heart disease Other   . Breast cancer Neg Hx     Review of Systems: Ms. quick notes that her toes sometimes turn blue, especially when they are cold.  She does not have any pain in her legs/feet with walking.  Otherwise, a 12-system review of systems was performed and was negative except as noted in the  HPI.  --------------------------------------------------------------------------------------------------  Physical Exam: BP 126/74 (BP Location: Left Arm, Patient Position: Sitting, Cuff Size: Normal)   Pulse 70   Ht 5' 3" (1.6 m)   Wt 127 lb (57.6 kg)   SpO2 98%   BMI 22.50 kg/m   General: NAD. HEENT: No conjunctival pallor or scleral icterus. Facemask in place. Neck: Supple without lymphadenopathy, thyromegaly, JVD, or HJR. No carotid bruit. Lungs: Normal work of breathing. Clear to auscultation bilaterally without wheezes or crackles. Heart: Regular rate and rhythm without murmurs, rubs, or gallops. Non-displaced PMI. Abd: Bowel sounds present. Soft, NT/ND without hepatosplenomegaly Ext: No lower extremity edema. Radial, PT, and DP pulses are 2+ bilaterally Skin: Warm and dry without rash. Neuro: CNIII-XII intact. Strength   and fine-touch sensation intact in upper and lower extremities bilaterally. Psych: Normal mood and affect.  EKG: Normal sinus rhythm without abnormality.  Lab Results  Component Value Date   WBC 4.3 09/21/2020   HGB 13.0 09/21/2020   HCT 38.8 09/21/2020   MCV 91.9 09/21/2020   PLT 256.0 09/21/2020    Lab Results  Component Value Date   NA 140 09/21/2020   K 4.1 09/21/2020   CL 102 09/21/2020   CO2 29 09/21/2020   BUN 13 09/21/2020   CREATININE 0.84 09/21/2020   GLUCOSE 78 09/21/2020   ALT 20 09/21/2020    Lab Results  Component Value Date   CHOL 177 09/21/2020   HDL 73.20 09/21/2020   LDLCALC 89 09/21/2020   TRIG 71.0 09/21/2020   CHOLHDL 2 09/21/2020     --------------------------------------------------------------------------------------------------  ASSESSMENT AND PLAN: Dyspnea on exertion, chest pain, and aortic atherosclerosis: Ms. Marik reports more than a years worth of exertional dyspnea that has gradually worsened as well as "vague" chest pressure that occurs with activities such as playing with her grandchildren.  Symptoms  certainly could reflect ischemic heart disease, particularly in light of recent finding of aortic atherosclerosis (mild on my review of CT images).  Physical exam and EKG are normal today.  We have discussed further evaluation options and have agreed to perform an exercise myocardial perfusion stress test and transthoracic echocardiogram.  Pending these tests, we will continue her current regimen of aspirin and lovastatin.  Hyperlipidemia: Lipids reasonably well controlled with LDL of 89 on last check in April.  Given findings of aortic atherosclerosis and concern for ischemic heart disease, escalation of lipid therapy for target LDL less than 70 will need to be considered; we will defer this discussion until after completion of the aforementioned stress test.  Shared Decision Making/Informed Consent The risks [chest pain, shortness of breath, cardiac arrhythmias, dizziness, blood pressure fluctuations, myocardial infarction, stroke/transient ischemic attack, nausea, vomiting, allergic reaction, radiation exposure, metallic taste sensation and life-threatening complications (estimated to be 1 in 10,000)], benefits (risk stratification, diagnosing coronary artery disease, treatment guidance) and alternatives of a nuclear stress test were discussed in detail with Ms. Seckinger and she agrees to proceed.  Follow-up: Return to clinic in 6-8 weeks  Christopher End, MD 10/13/2020 6:43 AM  

## 2020-10-12 ENCOUNTER — Encounter: Payer: Self-pay | Admitting: Internal Medicine

## 2020-10-13 ENCOUNTER — Encounter: Payer: Self-pay | Admitting: Internal Medicine

## 2020-10-13 DIAGNOSIS — R072 Precordial pain: Secondary | ICD-10-CM | POA: Insufficient documentation

## 2020-10-13 DIAGNOSIS — R0609 Other forms of dyspnea: Secondary | ICD-10-CM | POA: Insufficient documentation

## 2020-10-13 NOTE — Telephone Encounter (Signed)
Okay to fill out form 

## 2020-10-18 ENCOUNTER — Other Ambulatory Visit: Payer: Self-pay

## 2020-10-18 ENCOUNTER — Encounter
Admission: RE | Admit: 2020-10-18 | Discharge: 2020-10-18 | Disposition: A | Discharge status: Home / Self Care | Payer: Medicare Other | Source: Ambulatory Visit | Attending: Internal Medicine | Admitting: Internal Medicine

## 2020-10-18 DIAGNOSIS — R072 Precordial pain: Secondary | ICD-10-CM | POA: Diagnosis not present

## 2020-10-18 LAB — NM MYOCAR MULTI W/SPECT W/WALL MOTION / EF
LV dias vol: 27 mL (ref 46–106)
LV sys vol: 10 mL
Peak HR: 103 {beats}/min
Percent HR: 69 %
Rest HR: 68 {beats}/min
SDS: 0
SRS: 3
SSS: 0
TID: 0.95

## 2020-10-18 MED ORDER — REGADENOSON 0.4 MG/5ML IV SOLN
0.4000 mg | Freq: Once | INTRAVENOUS | Status: AC
  Administered 2020-10-18: 0.4 mg via INTRAVENOUS
  Filled 2020-10-18: qty 5

## 2020-10-18 MED ORDER — TECHNETIUM TC 99M TETROFOSMIN IV KIT
10.0000 | PACK | Freq: Once | INTRAVENOUS | Status: AC | PRN
  Administered 2020-10-18: 10.78 via INTRAVENOUS

## 2020-10-18 MED ORDER — TECHNETIUM TC 99M TETROFOSMIN IV KIT
30.0000 | PACK | Freq: Once | INTRAVENOUS | Status: AC | PRN
  Administered 2020-10-18: 32.8 via INTRAVENOUS

## 2020-11-09 ENCOUNTER — Other Ambulatory Visit: Payer: Self-pay

## 2020-11-10 ENCOUNTER — Ambulatory Visit (INDEPENDENT_AMBULATORY_CARE_PROVIDER_SITE_OTHER): Payer: Medicare Other

## 2020-11-10 ENCOUNTER — Other Ambulatory Visit: Payer: Self-pay

## 2020-11-10 DIAGNOSIS — R0609 Other forms of dyspnea: Secondary | ICD-10-CM | POA: Diagnosis not present

## 2020-11-10 DIAGNOSIS — R072 Precordial pain: Secondary | ICD-10-CM

## 2020-11-10 LAB — ECHOCARDIOGRAM COMPLETE
AR max vel: 2.43 cm2
AV Area VTI: 2.37 cm2
AV Area mean vel: 2.28 cm2
AV Mean grad: 4 mmHg
AV Peak grad: 8.1 mmHg
Ao pk vel: 1.42 m/s
Area-P 1/2: 5.2 cm2
Calc EF: 53.8 %
S' Lateral: 2.7 cm
Single Plane A2C EF: 51.9 %
Single Plane A4C EF: 52.6 %

## 2020-11-12 NOTE — Progress Notes (Signed)
Primary Care Physician: McLean-Scocuzza, Pasty Spillers, MD  Primary Gastroenterologist:  Dr. Midge Minium  Chief Complaint  Patient presents with   Constipation     HPI: Sandra Horton is a 70 y.o. female here after seeing me in 2019 with colon polyps found at that time during a screening colonoscopy.  The patient was recommended to have a repeat colonoscopy in 5 years.  The patient has now been sent to me for chronic constipation.  The patient saw her primary care provider back in April she had reported some diarrhea that was going on for 2 weeks with back pain.  It was reported that she was having up to 13 foul smelling stools per day for the first 5 days then it went down to approximately 5 times a day.  She then started to have abdominal pain which was generalized with chronic constipation and she restarted her MiraLAX.  It appears that when she restarted the MiraLAX.  On May 3 the patient underwent a CT scan of the abdomen that showed:  IMPRESSION: 1. Prominence of frothy material in the colon likely corresponding with the patient's diarrheal process. Accentuated mucosal enhancement in the rectum could reflect a low-grade proctitis/distal colitis but there is no wall thickening in the remainder of the colon. 2. 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 3.  Aortic Atherosclerosis (ICD10-I70.0). 4. Mild left foraminal impingement at L4-5 and L5-S1.  The patient had normal labs at that time but it does not appear she had any stool studies sent off.   The patient states that all of her symptoms are gone and she is back to normal taking the MiraLAX without any diarrhea or constipation.  She also reports that she has no abdominal pain rectal bleeding or unexplained weight loss.  Past Medical History:  Diagnosis Date   Allergy    Anti-cardiolipin antibody positive    Dr. Gavin Potters    Anxiety    Arthritis    BRCA negative 2016   MyRisk  neg    Depression    Family history of ovarian cancer 2016   MyRisk neg; affected sister is BRCA neg   GERD (gastroesophageal reflux disease)    Herpes simplex    High cholesterol    Hypercholesteremia    Hyperlipidemia    Hypothyroidism    Osteopenia    DEXA 09/02/13    Rosacea    Ruptured disc, cervical    Thyroid disease    Vertigo    per pt cervicogenic Physical therapy helped    Vitamin D deficiency    Wears dentures    full upper    Current Outpatient Medications  Medication Sig Dispense Refill   Ascorbic Acid (VITAMIN C) 500 MG CHEW      aspirin 81 MG tablet Take 81 mg by mouth daily.     Calcium 500 MG tablet Take 500 mg by mouth in the morning and at bedtime.     estradiol (ESTRACE) 0.1 MG/GM vaginal cream Insert 1 g vaginally once weekly 42.5 g 3   levothyroxine (SYNTHROID) 88 MCG tablet Take 1 tablet (88 mcg total) by mouth daily before breakfast. Empty stomach 90 tablet 3   lovastatin (MEVACOR) 40 MG tablet Take 1 tablet (40 mg total) by mouth at bedtime. 90 tablet 3   magnesium chloride (SLOW-MAG) 64 MG TBEC SR tablet Take 1 tablet by mouth.     Meclizine HCl (ANTIVERT PO) Take by mouth as  needed.     meloxicam (MOBIC) 15 MG tablet Take 1 tablet (15 mg total) by mouth daily as needed for pain. 90 tablet 3   Multiple Vitamins-Minerals (MULTIVITAMIN ADULT PO)      omeprazole (PRILOSEC) 40 MG capsule Take 1 capsule (40 mg total) by mouth daily. Wait 3-4 hours after thyroid med. Take 30 minutes before food lunch/dinner 90 capsule 3   PARoxetine (PAXIL) 20 MG tablet Take 1 tablet (20 mg total) by mouth daily. 90 tablet 3   polyethylene glycol powder (GLYCOLAX/MIRALAX) powder Take 17 g by mouth daily as needed. 850 g 11   valACYclovir (VALTREX) 500 MG tablet Take 1 tab daily as preventive or BID for 3 days prn sx flare 90 tablet 3   No current facility-administered medications for this visit.    Allergies as of 11/13/2020 - Review Complete 11/13/2020  Allergen Reaction  Noted   Penicillin v potassium Anaphylaxis 11/03/2014   Penicillins Anaphylaxis 11/03/2014   Tetanus toxoid Nausea And Vomiting 11/03/2014    ROS:  General: Negative for anorexia, weight loss, fever, chills, fatigue, weakness. ENT: Negative for hoarseness, difficulty swallowing , nasal congestion. CV: Negative for chest pain, angina, palpitations, dyspnea on exertion, peripheral edema.  Respiratory: Negative for dyspnea at rest, dyspnea on exertion, cough, sputum, wheezing.  GI: See history of present illness. GU:  Negative for dysuria, hematuria, urinary incontinence, urinary frequency, nocturnal urination.  Endo: Negative for unusual weight change.    Physical Examination:   BP 104/62   Pulse 80   Temp 98 F (36.7 C) (Temporal)   Ht $R'5\' 3"'Tr$  (1.6 m)   Wt 124 lb 12.8 oz (56.6 kg)   BMI 22.11 kg/m   General: Well-nourished, well-developed in no acute distress.  Eyes: No icterus. Conjunctivae pink. Neuro: Alert and oriented x 3.  Grossly intact. Skin: Warm and dry, no jaundice.   Psych: Alert and cooperative, normal mood and affect.  Labs:    Imaging Studies: NM Myocar Multi W/Spect W/Wall Motion / EF  Result Date: 10/18/2020  T wave inversion was noted during stress in the III, aVF, V5, V6 and II leads.  There was no ST segment deviation noted during stress.  The study is normal.  This is a low risk study.  The left ventricular ejection fraction is normal visually although calculated value is 43%. recommend correlation with echo  There is no evidence for ischemia    ECHOCARDIOGRAM COMPLETE  Result Date: 11/10/2020    ECHOCARDIOGRAM REPORT   Patient Name:   Sandra Horton Date of Exam: [birth year] Medical Rec #:  [id number]     Height:       63.0 in Accession #:    [id number]    Weight:       127.0 lb Date of Birth:  [birth year]     BSA:          1.594 m Patient Age:    6 years      BP:           124/68 mmHg Patient Gender: F             HR:           64 bpm. Exam Location:   [facility] Procedure: 2D Echo, Cardiac Doppler and Color Doppler Indications:    R06.02 SOB; R07.9* Chest pain, unspecified  History:        Patient has prior history of Echocardiogram examinations, most  recent 07/11/2016. Signs/Symptoms:Shortness of Breath, Chest Pain                 and Syncope; Risk Factors:Dyslipidemia and Non-Smoker.  Sonographer:    Quentin Ore RDMS, RVT, RDCS Referring Phys: [id number] CHRISTOPHER END  Sonographer Comments: Technically difficult study due to poor echo windows. IMPRESSIONS  1. Left ventricular ejection fraction, by estimation, is 55 to 60%. The left ventricle has normal function. The left ventricle has no regional wall motion abnormalities. Left ventricular diastolic parameters are consistent with Grade I diastolic dysfunction (impaired relaxation).  2. Right ventricular systolic function is normal. The right ventricular size is normal. There is normal pulmonary artery systolic pressure.  3. The mitral valve is abnormal. Mild mitral valve regurgitation. No evidence of mitral stenosis. There is mild late systolic prolapse of posterior leaflet of the mitral valve.  4. Tricuspid valve regurgitation is moderate.  5. The aortic valve is tricuspid. There is mild thickening of the aortic valve. Aortic valve regurgitation is trivial. Mild aortic valve sclerosis is present, with no evidence of aortic valve stenosis.  6. The inferior vena cava is normal in size with greater than 50% respiratory variability, suggesting right atrial pressure of 3 mmHg. FINDINGS  Left Ventricle: Left ventricular ejection fraction, by estimation, is 55 to 60%. The left ventricle has normal function. The left ventricle has no regional wall motion abnormalities. The left ventricular internal cavity size was normal in size. There is  no left ventricular hypertrophy. Left ventricular diastolic parameters are consistent with Grade I diastolic dysfunction (impaired relaxation). Right Ventricle: The right  ventricular size is normal. No increase in right ventricular wall thickness. Right ventricular systolic function is normal. There is normal pulmonary artery systolic pressure. The tricuspid regurgitant velocity is 2.58 m/s, and  with an assumed right atrial pressure of 3 mmHg, the estimated right ventricular systolic pressure is 29.6 mmHg. Left Atrium: Left atrial size was normal in size. Right Atrium: Right atrial size was normal in size. Pericardium: There is no evidence of pericardial effusion. Mitral Valve: The mitral valve is abnormal. There is mild late systolic prolapse of posterior leaflet of the mitral valve. There is mild thickening of the mitral valve leaflet(s). Mild mitral valve regurgitation. No evidence of mitral valve stenosis. Tricuspid Valve: The tricuspid valve is normal in structure. Tricuspid valve regurgitation is moderate. Aortic Valve: The aortic valve is tricuspid. There is mild thickening of the aortic valve. Aortic valve regurgitation is trivial. Mild aortic valve sclerosis is present, with no evidence of aortic valve stenosis. Aortic valve mean gradient measures 4.0 mmHg. Aortic valve peak gradient measures 8.1 mmHg. Aortic valve area, by VTI measures 2.37 cm. Pulmonic Valve: The pulmonic valve was not well visualized. Aorta: The aortic root is normal in size and structure. Pulmonary Artery: The pulmonary artery is not well seen. Venous: The inferior vena cava is normal in size with greater than 50% respiratory variability, suggesting right atrial pressure of 3 mmHg. IAS/Shunts: The interatrial septum was not well visualized.  LEFT VENTRICLE PLAX 2D LVIDd:         3.80 cm     Diastology LVIDs:         2.70 cm     LV e' medial:    5.11 cm/s LV PW:         0.80 cm     LV E/e' medial:  13.6 LV IVS:        0.80 cm     LV e' lateral:  9.79 cm/s LVOT diam:     2.00 cm     LV E/e' lateral: 7.1 LV SV:         75 LV SV Index:   47 LVOT Area:     3.14 cm  LV Volumes (MOD) LV vol d, MOD A2C: 51.6  ml LV vol d, MOD A4C: 55.3 ml LV vol s, MOD A2C: 24.8 ml LV vol s, MOD A4C: 26.2 ml LV SV MOD A2C:     26.8 ml LV SV MOD A4C:     55.3 ml LV SV MOD BP:      29.7 ml RIGHT VENTRICLE             IVC RV Basal diam:  3.00 cm     IVC diam: 1.30 cm RV S prime:     13.50 cm/s TAPSE (M-mode): 2.3 cm LEFT ATRIUM             Index       RIGHT ATRIUM           Index LA diam:        2.90 cm 1.82 cm/m  RA Area:     13.10 cm LA Vol (A2C):   42.9 ml 26.91 ml/m RA Volume:   32.50 ml  20.39 ml/m LA Vol (A4C):   32.9 ml 20.64 ml/m LA Biplane Vol: 39.9 ml 25.03 ml/m  AORTIC VALVE AV Area (Vmax):    2.43 cm AV Area (Vmean):   2.28 cm AV Area (VTI):     2.37 cm AV Vmax:           142.00 cm/s AV Vmean:          98.300 cm/s AV VTI:            0.317 m AV Peak Grad:      8.1 mmHg AV Mean Grad:      4.0 mmHg LVOT Vmax:         110.00 cm/s LVOT Vmean:        71.200 cm/s LVOT VTI:          0.239 m LVOT/AV VTI ratio: 0.75  AORTA Ao Root diam: 2.70 cm Ao Arch diam: 2.5 cm MITRAL VALVE               TRICUSPID VALVE MV Area (PHT): 5.20 cm    TR Peak grad:   26.6 mmHg MV Decel Time: 146 msec    TR Vmax:        258.00 cm/s MV E velocity: 69.40 cm/s MV A velocity: 85.70 cm/s  SHUNTS MV E/A ratio:  0.81        Systemic VTI:  0.24 m                            Systemic Diam: 2.00 cm Nelva Bush MD Electronically signed by Nelva Bush MD Signature Date/Time: 11/10/2020/10:32:17 PM    Final     Assessment and Plan:   ESTEFANNY MOLER is a 70 y.o. y/o female who comes in today with a history of diarrhea that has subsequently resolved.  It is likely that the patient's diarrhea in conjunction with her back pain was either an infectious etiology or possibly ischemia.  It is unlikely that this represents any malignancy or worrisome diagnosis.  The patient has been told to contact me if her symptoms come back whereupon we may need to do stool sample studies for possible infection.  The patient has been explained the plan and agrees with  it.     [name], MD. Marval Regal    Note: This dictation was prepared with Dragon dictation along with smaller phrase technology. Any transcriptional errors that result from this process are unintentional.

## 2020-11-13 ENCOUNTER — Other Ambulatory Visit: Payer: Self-pay

## 2020-11-13 ENCOUNTER — Ambulatory Visit (INDEPENDENT_AMBULATORY_CARE_PROVIDER_SITE_OTHER): Payer: Medicare Other | Admitting: Gastroenterology

## 2020-11-13 VITALS — BP 104/62 | HR 80 | Temp 98.0°F | Ht 63.0 in | Wt 124.8 lb

## 2020-11-13 DIAGNOSIS — R197 Diarrhea, unspecified: Secondary | ICD-10-CM | POA: Diagnosis not present

## 2020-11-14 ENCOUNTER — Telehealth: Payer: Self-pay | Admitting: Medical

## 2020-11-14 NOTE — Telephone Encounter (Signed)
  Patient Consent for Virtual Visit        Sandra Horton has provided verbal consent on 11/14/2020 for a virtual visit (video or telephone).   CONSENT FOR VIRTUAL VISIT FOR:  Sandra Horton  By participating in this virtual visit I agree to the following:  I hereby voluntarily request, consent and authorize [facility] and its employed or contracted physicians, physician assistants, nurse practitioners or other licensed health care professionals (the Practitioner), to provide me with telemedicine health care services (the "Services") as deemed necessary by the treating Practitioner. I acknowledge and consent to receive the Services by the Practitioner via telemedicine. I understand that the telemedicine visit will involve communicating with the Practitioner through live audiovisual communication technology and the disclosure of certain medical information by electronic transmission. I acknowledge that I have been given the opportunity to request an in-person assessment or other available alternative prior to the telemedicine visit and am voluntarily participating in the telemedicine visit.  I understand that I have the right to withhold or withdraw my consent to the use of telemedicine in the course of my care at any time, without affecting my right to future care or treatment, and that the Practitioner or I may terminate the telemedicine visit at any time. I understand that I have the right to inspect all information obtained and/or recorded in the course of the telemedicine visit and may receive copies of available information for a reasonable fee.  I understand that some of the potential risks of receiving the Services via telemedicine include:  Delay or interruption in medical evaluation due to technological equipment failure or disruption; Information transmitted may not be sufficient (e.g. poor resolution of images) to allow for appropriate medical decision making by the Practitioner; and/or   In rare instances, security protocols could fail, causing a breach of personal health information.  Furthermore, I acknowledge that it is my responsibility to provide information about my medical history, conditions and care that is complete and accurate to the best of my ability. I acknowledge that Practitioner's advice, recommendations, and/or decision may be based on factors not within their control, such as incomplete or inaccurate data provided by me or distortions of diagnostic images or specimens that may result from electronic transmissions. I understand that the practice of medicine is not an exact science and that Practitioner makes no warranties or guarantees regarding treatment outcomes. I acknowledge that a copy of this consent can be made available to me via my patient portal ([facility]), or I can request a printed copy by calling the office of [facility].    I understand that my insurance will be billed for this visit.   I have read or had this consent read to me. I understand the contents of this consent, which adequately explains the benefits and risks of the Services being provided via telemedicine.  I have been provided ample opportunity to ask questions regarding this consent and the Services and have had my questions answered to my satisfaction. I give my informed consent for the services to be provided through the use of telemedicine in my medical care

## 2020-11-15 NOTE — Progress Notes (Signed)
Virtual Visit via Video Note   This visit type was conducted due to national recommendations for restrictions regarding the COVID-19 Pandemic (e.g. social distancing) in an effort to limit this patient's exposure and mitigate transmission in our community.  Due to her co-morbid illnesses, this patient is at least at moderate risk for complications without adequate follow up.  This format is felt to be most appropriate for this patient at this time.  All issues noted in this document were discussed and addressed.  A limited physical exam was performed with this format.  Please refer to the patient's chart for her consent to telehealth for [facility].  Video Connection Lost Video connection was lost at < 50% of the duration of this visit, at which time the remainder of the visit was completed via audio only.     Date:  11/15/2020   ID:  Sandra Horton, DOB 1950-12-21, MRN [id number] The patient was identified using 2 identifiers.  Patient Location: Home Provider Location: Office/Clinic   PCP:  McLean-Scocuzza, Pasty Spillers, MD   [facility] Providers Cardiologist:  None     Evaluation Performed:  Follow-Up Visit  Chief Complaint:  Shortness of breath and chest pain  History of Present Illness:    Sandra Horton is a 70 y.o. female with h/o atherosclerosis at request of Dr. Judie Grieve, HLD, depression, anxiety, aortic atherosclerosis on CT 202i who is being seen for follow-up.   She saw Dr. Okey Dupre 10/11/20 for SOB and chest pain for the last 1.5 years. Myoview and echo were ordered. Myoview showed TWI inversion during stress, overall normal, low risk, no evidence of ischemia. Echo showed LVEF 55-60%, G1DD, normal RV function and PA pressure, mild MR, mild late prolapse of posterior leaflet of MV, mod TR, mild thicking of aortic valve, trivial AI, mild sclerosis aortic valve.    Today, Myoview and echo were reviewed. Still feels shortness of breath with exertion and has vague chest  pain. She cares for grandkids and great grandkids so stays active. She walked 30-80ft and felt short winded. Breathing started worsening about 8-10 months. Didn't have COVID. Seems to be the same. No orthopea, pnd. Has mild dependent edema. Has not seen pulmonologist. No h/o of pulmonary issues. No prior dx of sleep apnea, but does snore at night. Chest pain same as before. Left sided and pressure-like. It can last a couple seconds to a couple minutes. Also worse with walking. Says symptoms do not interfere with function. Patient does not want to pursue any invasive procedures.   The patient does not have symptoms concerning for COVID-19 infection (fever, chills, cough, or new shortness of breath).    Past Medical History:  Diagnosis Date   Allergy    Anti-cardiolipin antibody positive    Dr. Gavin Potters    Anxiety    Arthritis    BRCA negative 2016   MyRisk neg    Depression    Family history of ovarian cancer 2016   MyRisk neg; affected sister is BRCA neg   GERD (gastroesophageal reflux disease)    Herpes simplex    High cholesterol    Hypercholesteremia    Hyperlipidemia    Hypothyroidism    Osteopenia    DEXA 09/02/13    Rosacea    Ruptured disc, cervical    Thyroid disease    Vertigo    per pt cervicogenic Physical therapy helped    Vitamin D deficiency    Wears dentures    full upper  Past Surgical History:  Procedure Laterality Date   CERVICAL BIOPSY  W/ LOOP ELECTRODE EXCISION     COLONOSCOPY     COLONOSCOPY WITH PROPOFOL N/A 03/30/2018   Procedure: COLONOSCOPY WITH Biopsies;  Surgeon: [name], MD;  Location: [facility];  Service: Endoscopy;  Laterality: N/A;   DILATION AND CURETTAGE OF UTERUS  30 years ago   EYE SURGERY     b/l cataract    POLYPECTOMY N/A 03/30/2018   Procedure: POLYPECTOMY;  Surgeon: [name], MD;  Location: [facility];  Service: Endoscopy;  Laterality: N/A;     No outpatient medications have been marked as taking for  the 11/16/20 encounter (Appointment) with Kathlen Mody, Crosley Stejskal H, PA-C.     Allergies:   Penicillin v potassium, Penicillins, and Tetanus toxoid   Social History   Tobacco Use   Smoking status: Never   Smokeless tobacco: Never  Vaping Use   Vaping Use: Never used  Substance Use Topics   Alcohol use: No    Alcohol/week: 0.0 standard drinks   Drug use: No     Family Hx: The patient's family history includes Cancer in her mother; Colon cancer (age of onset: 67) in her maternal aunt; Diabetes in her daughter and mother; Heart attack (age of onset: 55) in her father; Heart disease in an other family member; Hypertension in her father; Ovarian cancer (age of onset: 62) in her sister; Stroke in her father; Thyroid disease in her mother and sister. There is no history of Breast cancer.  ROS:   Please see the history of present illness.     All other systems reviewed and are negative.   Prior CV studies:   The following studies were reviewed today:  Echo 11/2020 1. Left ventricular ejection fraction, by estimation, is 55 to 60%. The  left ventricle has normal function. The left ventricle has no regional  wall motion abnormalities. Left ventricular diastolic parameters are  consistent with Grade I diastolic  dysfunction (impaired relaxation).   2. Right ventricular systolic function is normal. The right ventricular  size is normal. There is normal pulmonary artery systolic pressure.   3. The mitral valve is abnormal. Mild mitral valve regurgitation. No  evidence of mitral stenosis. There is mild late systolic prolapse of  posterior leaflet of the mitral valve.   4. Tricuspid valve regurgitation is moderate.   5. The aortic valve is tricuspid. There is mild thickening of the aortic  valve. Aortic valve regurgitation is trivial. Mild aortic valve sclerosis  is present, with no evidence of aortic valve stenosis.   6. The inferior vena cava is normal in size with greater than 50%  respiratory  variability, suggesting right atrial pressure of 3 mmHg.    Myoview Lexiscan 10/2020 T wave inversion was noted during stress in the III, aVF, V5, V6 and II leads. There was no ST segment deviation noted during stress. The study is normal. This is a low risk study. The left ventricular ejection fraction is normal visually although calculated value is 43%. recommend correlation with echo There is no evidence for ischemia  Labs/Other Tests and Data Reviewed:    EKG:  An ECG dated 10/11/20 was personally reviewed today and demonstrated:    NSR 70bpm, nonspecific T wave changes.  Recent Labs: 09/21/2020: ALT 20; BUN 13; Creatinine, Ser 0.84; Hemoglobin 13.0; Platelets 256.0; Potassium 4.1; Sodium 140; TSH 2.35   Recent Lipid Panel Lab Results  Component Value Date/Time   CHOL 177 09/21/2020 10:57  AM   CHOL 183 06/07/2019 09:54 AM   TRIG 71.0 09/21/2020 10:57 AM   HDL 73.20 09/21/2020 10:57 AM   HDL 77 06/07/2019 09:54 AM   CHOLHDL 2 09/21/2020 10:57 AM   LDLCALC 89 09/21/2020 10:57 AM   LDLCALC 91 06/07/2019 09:54 AM    Wt Readings from Last 3 Encounters:  11/13/20 124 lb 12.8 oz (56.6 kg)  10/11/20 127 lb (57.6 kg)  09/21/20 127 lb (57.6 kg)    Objective:    Vital Signs:  There were no vitals taken for this visit.   VITAL SIGNS:  reviewed Pulse 74bpm  ASSESSMENT & PLAN:    Shortness of breath and chest pain Myoview and echo reviewed. Myoview low risk with no ischemia. Echo with preserved EF, G1DD and mild valvular disease. Patient still reports DOE and chest pain on exertion. Symptoms unchanged for the last 8-10 months. No prior lung dx. She does snore at night, she is willing to undergo sleep study. She does not want to undergo any invasive procedures since symptoms are not limiting her function.   HL LDL 89. No coronary calcifications noted on imaging. Continue Lovastatin and lifestyle changes.    Valvular disease Mild MR and mild thickening of aortic valve with  trivial AI by recent echo. Patient has occasionally dependent edema, but no orthopnea or pnd. Can follow with serial echos.   HFpEF Patient reports occasional dependent edema. No orthopnea or pnd. Weights stable. Continue to monitor for symptoms.   COVID-19 Education: The signs and symptoms of COVID-19 were discussed with the patient and how to seek care for testing (follow up with PCP or arrange E-visit).  The importance of social distancing was discussed today.  Time:   Today, I have spent 30 minutes with the patient with telehealth technology discussing the above problems.     Medication Adjustments/Labs and Tests Ordered: Current medicines are reviewed at length with the patient today.  Concerns regarding medicines are outlined above.   Tests Ordered: No orders of the defined types were placed in this encounter.   Medication Changes: No orders of the defined types were placed in this encounter.   Follow Up:  In Person in 6 months.   Signed, Terrance Usery Ninfa Meeker, PA-C  11/15/2020 12:26 PM    [facility]

## 2020-11-16 ENCOUNTER — Telehealth (INDEPENDENT_AMBULATORY_CARE_PROVIDER_SITE_OTHER): Payer: Medicare Other | Admitting: Medical

## 2020-11-16 ENCOUNTER — Encounter: Payer: Self-pay | Admitting: Medical

## 2020-11-16 VITALS — HR 74 | Ht 63.0 in

## 2020-11-16 DIAGNOSIS — I5032 Chronic diastolic (congestive) heart failure: Secondary | ICD-10-CM

## 2020-11-16 DIAGNOSIS — I340 Nonrheumatic mitral (valve) insufficiency: Secondary | ICD-10-CM

## 2020-11-16 DIAGNOSIS — E782 Mixed hyperlipidemia: Secondary | ICD-10-CM

## 2020-11-16 DIAGNOSIS — R0600 Dyspnea, unspecified: Secondary | ICD-10-CM | POA: Diagnosis not present

## 2020-11-16 DIAGNOSIS — R0683 Snoring: Secondary | ICD-10-CM

## 2020-11-16 DIAGNOSIS — R0609 Other forms of dyspnea: Secondary | ICD-10-CM

## 2020-11-16 NOTE — Patient Instructions (Signed)
Medication Instructions:  No changes at this time.  *If you need a refill on your cardiac medications before your next appointment, please call your pharmacy*   Lab Work: None  If you have labs (blood work) drawn today and your tests are completely normal, you will receive your results only by: [facility] (if you have MyChart) OR A paper copy in the mail If you have any lab test that is abnormal or we need to change your treatment, we will call you to review the results.   Testing/Procedures: Placed referral over to Pulmonary for shortness of breath, snoring, and sleep study.   Follow-Up: At [facility], you and your health needs are our priority.  As part of our continuing mission to provide you with exceptional heart care, we have created designated Provider Care Teams.  These Care Teams include your primary Cardiologist (physician) and Advanced Practice Providers (APPs -  Physician Assistants and Nurse Practitioners) who all work together to provide you with the care you need, when you need it.   Your next appointment:   6 month(s)  The format for your next appointment:   In Person  Provider:   Nelva Bush, MD

## 2020-12-05 ENCOUNTER — Telehealth: Payer: Self-pay | Admitting: Medical

## 2020-12-05 NOTE — Telephone Encounter (Signed)
Emily Filbert, RN  P Cv Div Burl Scheduling; P Cv Div [facility] Triage To scheduling to call patient for a follow up appointment. Thank you!         Previous Messages    ----- Message -----  From: Antony Madura, PA-C  Sent: 12/05/2020   1:43 PM EDT  To: Rebeca Alert [facility] Triage  Subject: follow-up                                       DO not see she has scheduled follow-up. Please schedule with Dr. Saunders Revel in 3-4 months. Thanks!   ----- Message -----  From: Nelva Bush, MD  Sent: 11/16/2020   6:42 PM EDT  To: Antony Madura, PA-C   Sleep study seems reasonable.  Patient should work on improving her stamina.  If symptoms persist, we could readdress further cardiac testing (I.e. cath).  ----- Message -----  From: Antony Madura, PA-C  Sent: 11/16/2020   4:13 PM EDT  To: Nelva Bush, MD   Still with DOE and CP, unchanged. Procedures unremarkable. Does not want invasive study. Snores at night, willing to do sleep study.  Any further advice is welcome.   Thanks!

## 2020-12-06 NOTE — Telephone Encounter (Signed)
Valora Corporal, RN  Meriam Sprague B Could you help schedule this? Looks like they have not seen it worked on yet.

## 2020-12-06 NOTE — Telephone Encounter (Signed)
Attempted to schedule no ans no vm  

## 2020-12-12 DIAGNOSIS — H26492 Other secondary cataract, left eye: Secondary | ICD-10-CM | POA: Diagnosis not present

## 2021-01-09 DIAGNOSIS — H26492 Other secondary cataract, left eye: Secondary | ICD-10-CM | POA: Diagnosis not present

## 2021-01-11 ENCOUNTER — Ambulatory Visit
Admission: RE | Admit: 2021-01-11 | Discharge: 2021-01-11 | Disposition: A | Discharge status: Home / Self Care | Payer: Medicare Other | Source: Ambulatory Visit | Attending: Pulmonary Disease | Admitting: Pulmonary Disease

## 2021-01-11 ENCOUNTER — Ambulatory Visit (INDEPENDENT_AMBULATORY_CARE_PROVIDER_SITE_OTHER): Payer: Medicare Other | Admitting: Pulmonary Disease

## 2021-01-11 ENCOUNTER — Ambulatory Visit
Admission: RE | Admit: 2021-01-11 | Discharge: 2021-01-11 | Disposition: A | Discharge status: Home / Self Care | Payer: Medicare Other | Attending: Pulmonary Disease | Admitting: Pulmonary Disease

## 2021-01-11 ENCOUNTER — Encounter: Payer: Self-pay | Admitting: Pulmonary Disease

## 2021-01-11 ENCOUNTER — Other Ambulatory Visit: Payer: Self-pay

## 2021-01-11 VITALS — BP 100/60 | HR 72 | Temp 97.3°F | Ht 63.0 in | Wt 120.2 lb

## 2021-01-11 DIAGNOSIS — R0600 Dyspnea, unspecified: Secondary | ICD-10-CM | POA: Diagnosis not present

## 2021-01-11 DIAGNOSIS — R0683 Snoring: Secondary | ICD-10-CM | POA: Diagnosis not present

## 2021-01-11 DIAGNOSIS — R0609 Other forms of dyspnea: Secondary | ICD-10-CM

## 2021-01-11 NOTE — Progress Notes (Signed)
[facility]  Chief Complaint  Patient presents with   Consult    Patient has shortness of breath with exertion that has got worse over the last year, also states that she snores very bad and cardiology thought she should have a sleep test. Feels fine at rest.     Constitutional:  BP 100/60 (BP Location: Left Arm, Patient Position: Sitting, Cuff Size: Normal)   Pulse 72   Temp (!) 97.3 F (36.3 C) (Oral)   Ht '5\' 3"'$  (1.6 m)   Wt 120 lb 3.2 oz (54.5 kg)   SpO2 97%   BMI 21.29 kg/m   Past Medical History:  Allergies, Anti cardiolipin Ab, Anxiety, Depression, GERD, HSV, HLD, Hypothyroidism, Osteopenia, Rosacea, Vertigo, Vit D deficiency  Past Surgical History:  She  has a past surgical history that includes Colonoscopy; Dilation and curettage of uterus (30 years ago); Cervical biopsy w/ loop electrode excision; Eye surgery; Colonoscopy with propofol (N/A, 03/30/2018); and polypectomy (N/A, 03/30/2018).  Brief Summary:  Sandra Horton is a 70 y.o. female with snoring and dyspnea.      Subjective:   She has noticed trouble with her breathing for the past 3 years.  There is no specific inciting event.  She was seen by cardiology and evaluation was unrevealing.  She can walk about 1/4 mile to her mailbox, and then gets short of breath.  She feels better after resting for a few minutes.  She will sometimes feel her heart skip, but denies dizziness.  No history of thromboembolic disease.  No history of asthma or pneumonia.  Never smoked cigarettes.  Denies cough, wheeze, or chest congestion.  Has been getting chest discomfort in her left chest intermittently.  Her husband has been worried about her snoring and says she will get shallow breathing at night.  She does get tired during the day.  She goes to sleep at 9 pm.  She falls asleep 15 minutes.  She wakes up one time to use the bathroom.  She gets out of bed at 430 am.  She feels okay in the  morning.  She denies morning headache.  She does not use anything to help her fall sleep or stay awake.  She denies sleep walking, sleep talking, bruxism, or nightmares.  There is no history of restless legs.  She denies sleep hallucinations, sleep paralysis, or cataplexy.  The Epworth score is 10 out of 24.  Labs from 09/21/20: WBC 4.3, Hb 13, PLT 256, Na 140, K 4.1, CO2 29, Creatinine 0.84, Ca 9.7, TSH 2.35.  Physical Exam:   Appearance - well kempt   ENMT - no sinus tenderness, no oral exudate, no LAN, Mallampati 3 airway, no stridor, wears dentures  Respiratory - equal breath sounds bilaterally, no wheezing or rales  CV - s1s2 regular rate and rhythm, no murmurs  Ext - no clubbing, no edema  Skin - no rashes  Psych - normal mood and affect   Pulmonary testing:    Chest Imaging:    Sleep Tests:    Cardiac Tests:  Echo 11/10/20 >> EF 55 to 60%, grade 1 DD, mild MR  Social History:  She  reports that she has never smoked. She has never used smokeless tobacco. She reports that she does not drink alcohol and does not use drugs.  Family History:  Her family history includes Cancer in her mother; Colon cancer (age of onset: 56) in her maternal aunt; Diabetes in her daughter  and mother; Heart attack (age of onset: 58) in her father; Heart disease in an other family member; Hypertension in her father; Ovarian cancer (age of onset: 21) in her sister; Stroke in her father; Thyroid disease in her mother and sister.     Assessment/Plan:   Dyspnea on exertion. - will arrange for chest xray and pulmonary function test to further assess - she has history of anticardiolipin antibody; however, her recent Echo was unremarkable, making thromboembolic disease less likely - she likely has a component of deconditioning contributing to her dyspnea, and would benefit for a regular exercise program if the remainder of her evaluation is unrevealing  Snoring with excessive daytime sleepiness  associated with apnea and history of depression. - will need to arrange for a home sleep study  Obesity. - discussed how weight can impact sleep and risk for sleep disordered breathing - discussed options to assist with weight loss: combination of diet modification, cardiovascular and strength training exercises  Cardiovascular risk. - had an extensive discussion regarding the adverse health consequences related to untreated sleep disordered breathing - specifically discussed the risks for hypertension, coronary artery disease, cardiac dysrhythmias, cerebrovascular disease, and diabetes - lifestyle modification discussed  Safe driving practices. - discussed how sleep disruption can increase risk of accidents, particularly when driving - safe driving practices were discussed  Therapies for obstructive sleep apnea. - if the sleep study shows significant sleep apnea, then various therapies for treatment were reviewed: CPAP, oral appliance, and surgical interventions    Time Spent Involved in Patient Care on Day of Examination:  33 minutes  Follow up:   Patient Instructions  Chest xray today  Will schedule pulmonary function test and home sleep study  Follow up in 4 to 6 weeks with Dr. Halford Chessman or Nurse Practitioner  Medication List:   Allergies as of 01/11/2021       Reactions   Penicillin V Potassium Anaphylaxis   Penicillins Anaphylaxis   Tetanus Toxoid Nausea And Vomiting        Medication List        Accurate as of January 11, 2021  9:28 AM. If you have any questions, ask your nurse or doctor.          ANTIVERT PO Take by mouth as needed.   aspirin 81 MG tablet Take 81 mg by mouth daily.   Calcium 500 MG tablet Take 500 mg by mouth in the morning and at bedtime.   estradiol 0.1 MG/GM vaginal cream Commonly known as: ESTRACE Insert 1 g vaginally once weekly   levothyroxine 88 MCG tablet Commonly known as: SYNTHROID Take 1 tablet (88 mcg total) by mouth  daily before breakfast. Empty stomach   lovastatin 40 MG tablet Commonly known as: MEVACOR Take 1 tablet (40 mg total) by mouth at bedtime.   magnesium chloride 64 MG Tbec SR tablet Commonly known as: SLOW-MAG Take 1 tablet by mouth.   meloxicam 15 MG tablet Commonly known as: MOBIC Take 1 tablet (15 mg total) by mouth daily as needed for pain.   MULTIVITAMIN ADULT PO   omeprazole 40 MG capsule Commonly known as: PRILOSEC Take 1 capsule (40 mg total) by mouth daily. Wait 3-4 hours after thyroid med. Take 30 minutes before food lunch/dinner   PARoxetine 20 MG tablet Commonly known as: PAXIL Take 1 tablet (20 mg total) by mouth daily.   polyethylene glycol powder 17 GM/SCOOP powder Commonly known as: GLYCOLAX/MIRALAX Take 17 g by mouth daily as needed.  valACYclovir 500 MG tablet Commonly known as: VALTREX Take 1 tab daily as preventive or BID for 3 days prn sx flare   Vitamin C 500 MG Chew        Signature:  Chesley Mires, MD [facility] Pager - [phone] 01/11/2021, 9:28 AM

## 2021-01-11 NOTE — Patient Instructions (Signed)
Chest xray today  Will schedule pulmonary function test and home sleep study  Follow up in 4 to 6 weeks with Dr. Halford Chessman or Nurse Practitioner

## 2021-01-12 ENCOUNTER — Other Ambulatory Visit
Admission: RE | Admit: 2021-01-12 | Discharge: 2021-01-12 | Disposition: A | Discharge status: Home / Self Care | Payer: Medicare Other | Source: Ambulatory Visit | Attending: Pulmonary Disease | Admitting: Pulmonary Disease

## 2021-01-12 DIAGNOSIS — Z20822 Contact with and (suspected) exposure to covid-19: Secondary | ICD-10-CM | POA: Insufficient documentation

## 2021-01-12 DIAGNOSIS — Z01812 Encounter for preprocedural laboratory examination: Secondary | ICD-10-CM | POA: Insufficient documentation

## 2021-01-12 LAB — SARS CORONAVIRUS 2 (TAT 6-24 HRS): SARS Coronavirus 2: NEGATIVE

## 2021-01-15 ENCOUNTER — Other Ambulatory Visit: Payer: Self-pay

## 2021-01-15 ENCOUNTER — Ambulatory Visit: Payer: Medicare Other | Attending: Pulmonary Disease

## 2021-01-15 ENCOUNTER — Telehealth: Payer: Self-pay

## 2021-01-15 DIAGNOSIS — R0600 Dyspnea, unspecified: Secondary | ICD-10-CM | POA: Diagnosis not present

## 2021-01-15 DIAGNOSIS — R0609 Other forms of dyspnea: Secondary | ICD-10-CM

## 2021-01-15 DIAGNOSIS — R0602 Shortness of breath: Secondary | ICD-10-CM | POA: Diagnosis not present

## 2021-01-15 MED ORDER — ALBUTEROL SULFATE (2.5 MG/3ML) 0.083% IN NEBU
2.5000 mg | INHALATION_SOLUTION | Freq: Once | RESPIRATORY_TRACT | Status: AC
  Administered 2021-01-15: 2.5 mg via RESPIRATORY_TRACT
  Filled 2021-01-15: qty 3

## 2021-01-15 NOTE — Telephone Encounter (Signed)
Called and spoke with Pt. Pt  aware of covid test and upcoming pft test.

## 2021-01-16 ENCOUNTER — Encounter: Payer: Self-pay | Admitting: *Deleted

## 2021-01-19 ENCOUNTER — Encounter: Payer: Self-pay | Admitting: Internal Medicine

## 2021-01-22 NOTE — Telephone Encounter (Signed)
Called and left a message to call back for triage.

## 2021-01-23 NOTE — Telephone Encounter (Signed)
Left a message to call back for triage. 

## 2021-01-24 ENCOUNTER — Encounter: Payer: Self-pay | Admitting: Internal Medicine

## 2021-02-03 ENCOUNTER — Other Ambulatory Visit: Payer: Self-pay | Admitting: Obstetrics and Gynecology

## 2021-02-03 DIAGNOSIS — N952 Postmenopausal atrophic vaginitis: Secondary | ICD-10-CM

## 2021-02-26 ENCOUNTER — Ambulatory Visit: Payer: Medicare Other | Admitting: Primary Care

## 2021-02-27 ENCOUNTER — Other Ambulatory Visit: Payer: Self-pay

## 2021-02-27 ENCOUNTER — Ambulatory Visit: Payer: Medicare Other

## 2021-02-27 DIAGNOSIS — G4733 Obstructive sleep apnea (adult) (pediatric): Secondary | ICD-10-CM | POA: Diagnosis not present

## 2021-02-27 DIAGNOSIS — R0683 Snoring: Secondary | ICD-10-CM

## 2021-02-27 NOTE — Progress Notes (Signed)
Cardiology Office Note:    Date:  02/28/2021   ID:  Sandra Horton, DOB 28-May-1951, MRN [id number]  PCP:  Sandra Horton, Sandra Spillers, MD  [facility] Cardiologist:  Dr. Sandi [facility] Electrophysiologist:  None   Referring MD: Sandra Horton, French Ana *   Chief Complaint: 3-4 month follow-up  History of Present Illness:    Sandra Horton is a 70 y.o. female with a hx of  h/o atherosclerosis at request of Dr. Judie Grieve, HLD, depression, anxiety, aortic atherosclerosis on CT who is being seen for follow-up.    She saw Dr. Okey Dupre 10/11/20 for SOB and chest pain for the last 1.5 years. Myoview and echo were ordered. Myoview showed TWI inversion during stress, overall normal, low risk, no evidence of ischemia. Echo showed LVEF 55-60%, G1DD, normal RV function and PA pressure, mild MR, mild late prolapse of posterior leaflet of MV, mod TR, mild thicking of aortic valve, trivial AI, mild sclerosis aortic valve.     Last seen 11/16/20 via televisit for review of myoview and echo, ordered for sob and cest pain.  Myoview was low risk with no ischemia. Echo showed preserved EF, G1DD, and mild valvular disease. She did not want to undergo invasive procedures wince symptoms were not limiting her function.   Today, the patient reports she has been doing the same since the last visit, stable sob and chest pain.Chest pain is left sided and not worse with exertion. No triggers. Stays for about a minute or 2. Shortness of breath is worse on exertion.Symptoms not limiting function. No LLE, orthopnea, pnd, palpitations. She had COVID since the last visit, she had low appetite and tried to lose a few lbs. She does no formal activity. 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    Past Medical History:  Diagnosis Date   Allergy    Anti-cardiolipin antibody positive    Dr. Gavin Potters    Anxiety    Arthritis    BRCA negative 2016   MyRisk neg    Depression    Family history of ovarian cancer 2016   MyRisk  neg; affected sister is BRCA neg   GERD (gastroesophageal reflux disease)    Herpes simplex    Hyperlipidemia    Hypothyroidism    Osteopenia    DEXA 09/02/13    Rosacea    Ruptured disc, cervical    Vertigo    per pt cervicogenic Physical therapy helped    Vitamin D deficiency    Wears dentures    full upper    Past Surgical History:  Procedure Laterality Date   CERVICAL BIOPSY  W/ LOOP ELECTRODE EXCISION     COLONOSCOPY     COLONOSCOPY WITH PROPOFOL N/A 03/30/2018   Procedure: COLONOSCOPY WITH Biopsies;  Surgeon: Midge Minium, MD;  Location: [facility];  Service: Endoscopy;  Laterality: N/A;   DILATION AND CURETTAGE OF UTERUS  30 years ago   EYE SURGERY     b/l cataract    POLYPECTOMY N/A 03/30/2018   Procedure: POLYPECTOMY;  Surgeon: Midge Minium, MD;  Location: [facility];  Service: Endoscopy;  Laterality: N/A;    Current Medications: Current Meds  Medication Sig   Ascorbic Acid (VITAMIN C) 500 MG CHEW    aspirin 81 MG tablet Take 81 mg by mouth daily.   Calcium 500 MG tablet Take 500 mg by mouth in the morning and at bedtime.   estradiol (ESTRACE) 0.1 MG/GM vaginal cream Insert 1 g vaginally once weekly  levothyroxine (SYNTHROID) 88 MCG tablet Take 1 tablet (88 mcg total) by mouth daily before breakfast. Empty stomach   lovastatin (MEVACOR) 40 MG tablet Take 1 tablet (40 mg total) by mouth at bedtime.   magnesium chloride (SLOW-MAG) 64 MG TBEC SR tablet Take 1 tablet by mouth.   Meclizine HCl (ANTIVERT PO) Take by mouth as needed.   meloxicam (MOBIC) 15 MG tablet Take 1 tablet (15 mg total) by mouth daily as needed for pain.   Multiple Vitamins-Minerals (MULTIVITAMIN ADULT PO)    omeprazole (PRILOSEC) 40 MG capsule Take 1 capsule (40 mg total) by mouth daily. Wait 3-4 hours after thyroid med. Take 30 minutes before food lunch/dinner   PARoxetine (PAXIL) 20 MG tablet Take 1 tablet (20 mg total) by mouth daily.   polyethylene glycol powder  (GLYCOLAX/MIRALAX) powder Take 17 g by mouth daily as needed.   valACYclovir (VALTREX) 500 MG tablet Take 1 tab daily as preventive or BID for 3 days prn sx flare     Allergies:   Penicillin v potassium, Penicillins, and Tetanus toxoid   Social History   Socioeconomic History   Marital status: Married    Spouse name: Not on file   Number of children: Not on file   Years of education: Not on file   Highest education level: Not on file  Occupational History   Not on file  Tobacco Use   Smoking status: Never   Smokeless tobacco: Never  Vaping Use   Vaping Use: Never used  Substance and Sexual Activity   Alcohol use: No    Alcohol/week: 0.0 standard drinks   Drug use: No   Sexual activity: Yes    Birth control/protection: Post-menopausal  Other Topics Concern   Not on file  Social History Narrative    [employer] [facility] retired as of 04/02/18    Social Determinants of Health   Financial Resource Strain: Low Risk    Difficulty of Paying Living Expenses: Not hard at all  Food Insecurity: No Food Insecurity   Worried About Programme researcher, broadcasting/film/video in the Last Year: Never true   Barista in the Last Year: Never true  Transportation Needs: No Transportation Needs   Lack of Transportation (Medical): No   Lack of Transportation (Non-Medical): No  Physical Activity: Not on file  Stress: No Stress Concern Present   Feeling of Stress : Not at all  Social Connections: Unknown   Frequency of Communication with Friends and Family: More than three times a week   Frequency of Social Gatherings with Friends and Family: More than three times a week   Attends Religious Services: Not on Scientist, clinical (histocompatibility and immunogenetics) or Organizations: Not on file   Attends Banker Meetings: Not on file   Marital Status: Not on file     Family History: The patient's family history includes Cancer in her mother; Colon cancer (age of onset: 42) in her maternal aunt; Diabetes in her daughter  and mother; Heart attack (age of onset: 22) in her father; Heart disease in an other family member; Hypertension in her father; Ovarian cancer (age of onset: 16) in her sister; Stroke in her father; Thyroid disease in her mother and sister. There is no history of Breast cancer.  ROS:   Please see the history of present illness.     All other systems reviewed and are negative.  EKGs/Labs/Other Studies Reviewed:    The following studies were reviewed today:  Echo 11/10/20 1. Left ventricular ejection fraction, by estimation, is 55 to 60%. The  left ventricle has normal function. The left ventricle has no regional  wall motion abnormalities. Left ventricular diastolic parameters are  consistent with Grade I diastolic  dysfunction (impaired relaxation).   2. Right ventricular systolic function is normal. The right ventricular  size is normal. There is normal pulmonary artery systolic pressure.   3. The mitral valve is abnormal. Mild mitral valve regurgitation. No  evidence of mitral stenosis. There is mild late systolic prolapse of  posterior leaflet of the mitral valve.   4. Tricuspid valve regurgitation is moderate.   5. The aortic valve is tricuspid. There is mild thickening of the aortic  valve. Aortic valve regurgitation is trivial. Mild aortic valve sclerosis  is present, with no evidence of aortic valve stenosis.   6. The inferior vena cava is normal in size with greater than 50%  respiratory variability, suggesting right atrial pressure of 3 mmHg.   Myoview 10/18/20 Narrative & Impression  T wave inversion was noted during stress in the III, aVF, V5, V6 and II leads. There was no ST segment deviation noted during stress. The study is normal. This is a low risk study. The left ventricular ejection fraction is normal visually although calculated value is 43%. recommend correlation with echo There is no evidence for ischemia    EKG:  EKG is  ordered today.  The ekg ordered today  demonstrates NSR, 72bpm, TWI III (old), no acute changes  Recent Labs: 09/21/2020: ALT 20; BUN 13; Creatinine, Ser 0.84; Hemoglobin 13.0; Platelets 256.0; Potassium 4.1; Sodium 140; TSH 2.35  Recent Lipid Panel    Component Value Date/Time   CHOL 177 09/21/2020 1057   CHOL 183 06/07/2019 0954   TRIG 71.0 09/21/2020 1057   HDL 73.20 09/21/2020 1057   HDL 77 06/07/2019 0954   CHOLHDL 2 09/21/2020 1057   VLDL 14.2 09/21/2020 1057   LDLCALC 89 09/21/2020 1057   LDLCALC 91 06/07/2019 0954    Physical Exam:    VS:  BP 110/60 (BP Location: Left Arm, Patient Position: Sitting, Cuff Size: Normal)   Pulse 72   Ht $R'5\' 3"'wW$  (1.6 m)   Wt 118 lb (53.5 kg)   BMI 20.90 kg/m     Wt Readings from Last 3 Encounters:  02/28/21 118 lb (53.5 kg)  01/11/21 120 lb 3.2 oz (54.5 kg)  11/13/20 124 lb 12.8 oz (56.6 kg)     GEN:  Well nourished, well developed in no acute distress HEENT: Normal NECK: No JVD; No carotid bruits LYMPHATICS: No lymphadenopathy CARDIAC: RRR, no murmurs, rubs, gallops RESPIRATORY:  Clear to auscultation without rales, wheezing or rhonchi  ABDOMEN: Soft, non-tender, non-distended MUSCULOSKELETAL:  No edema; No deformity  SKIN: Warm and dry NEUROLOGIC:  Alert and oriented x 3 PSYCHIATRIC:  Normal affect   ASSESSMENT:    1. Dyspnea on exertion   2. Hyperlipidemia, mixed   3. Mild mitral regurgitation   4. Chronic diastolic heart failure ([place])   5. Hyperlipidemia, unspecified hyperlipidemia type    PLAN:    In order of problems listed above:  Shortness of breath and chest pain Symptoms stable since the last visit, she has atypical chest pain and DOE. She is active all day caring for her grandson. Has lost about 5-7 lbs since the last visit. Prior echo 11/2020 showed EF 55-60%, no WMA, G1DD. Myoview 10/2020 was normal, low risk, with no evidence of ischemia. Symptoms are not  limiting function, so no plan for further work-up at this time. She will let us know if symptoms  worsen. Continue statin and ASA.  HLD LDL 89 09/2020 with a goal less than 70. Prior CT showed aortic atherosclerosis. She has made diet changes and lost some weight. Will re-check lipid panel. Continue Lovastatin. If LDL still high can switch to high intensity statin.  HFpEF Valvular disease Echo 11/2020 showed normal EF with mild MR, moderate TR, mild aortic sclerosis. She is euvolemic on exam. Denies LLE, orthopnea, pnd.   Disposition: Follow up in 6 month(s) with MD/APP   Signed, Renia Mikelson Ninfa Meeker, PA-C  02/28/2021 8:56 AM    [place]

## 2021-02-28 ENCOUNTER — Encounter: Payer: Self-pay | Admitting: Medical

## 2021-02-28 ENCOUNTER — Ambulatory Visit (INDEPENDENT_AMBULATORY_CARE_PROVIDER_SITE_OTHER): Payer: Medicare Other | Admitting: Medical

## 2021-02-28 VITALS — BP 110/60 | HR 72 | Ht 63.0 in | Wt 118.0 lb

## 2021-02-28 DIAGNOSIS — E782 Mixed hyperlipidemia: Secondary | ICD-10-CM | POA: Diagnosis not present

## 2021-02-28 DIAGNOSIS — R0600 Dyspnea, unspecified: Secondary | ICD-10-CM | POA: Diagnosis not present

## 2021-02-28 DIAGNOSIS — I340 Nonrheumatic mitral (valve) insufficiency: Secondary | ICD-10-CM

## 2021-02-28 DIAGNOSIS — I5032 Chronic diastolic (congestive) heart failure: Secondary | ICD-10-CM

## 2021-02-28 DIAGNOSIS — E785 Hyperlipidemia, unspecified: Secondary | ICD-10-CM

## 2021-02-28 DIAGNOSIS — R0609 Other forms of dyspnea: Secondary | ICD-10-CM

## 2021-02-28 NOTE — Patient Instructions (Signed)
Medication Instructions:  Your physician recommends that you continue on your current medications as directed. Please refer to the Current Medication list given to you today.  *If you need a refill on your cardiac medications before your next appointment, please call your pharmacy*   Lab Work: None ordered If you have labs (blood work) drawn today and your tests are completely normal, you will receive your results only by: MyChart Message (if you have MyChart) OR A paper copy in the mail If you have any lab test that is abnormal or we need to change your treatment, we will call you to review the results.   Testing/Procedures: None ordered   Follow-Up: At [facility], you and your health needs are our priority.  As part of our continuing mission to provide you with exceptional heart care, we have created designated Provider Care Teams.  These Care Teams include your primary Cardiologist (physician) and Advanced Practice Providers (APPs -  Physician Assistants and Nurse Practitioners) who all work together to provide you with the care you need, when you need it.  We recommend signing up for the patient portal called "MyChart".  Sign up information is provided on this After Visit Summary.  MyChart is used to connect with patients for Virtual Visits (Telemedicine).  Patients are able to view lab/test results, encounter notes, upcoming appointments, etc.  Non-urgent messages can be sent to your provider as well.   To learn more about what you can do with MyChart, go to [email or web address].    Your next appointment:   6 month(s)  The format for your next appointment:   In Person  Provider:   You may see Dr. End or one of the following Advanced Practice Providers on your designated Care Team:   Christopher Berge, NP Ryan Dunn, PA-C Jacquelyn Visser, PA-C Cadence Furth, PA-C   Other Instructions    

## 2021-03-01 ENCOUNTER — Telehealth: Payer: Self-pay | Admitting: Pulmonary Disease

## 2021-03-01 ENCOUNTER — Telehealth: Payer: Self-pay

## 2021-03-01 DIAGNOSIS — E785 Hyperlipidemia, unspecified: Secondary | ICD-10-CM

## 2021-03-01 DIAGNOSIS — G4733 Obstructive sleep apnea (adult) (pediatric): Secondary | ICD-10-CM | POA: Diagnosis not present

## 2021-03-01 NOTE — Telephone Encounter (Signed)
HST 02/27/21 >> AHI 23.4, SpO2 low 81%.   Please inform her that her sleep study shows moderate obstructive sleep apnea.  Will review treatment options at her ROV on 03/15/21.

## 2021-03-01 NOTE — Telephone Encounter (Signed)
Called patient per Cadence Furth's request to schedule her for a fasting lipid panel.  Patient requested to come in next Wednesday on 03/07/21. She is now scheduled for an 0800 appointment.

## 2021-03-06 NOTE — Telephone Encounter (Signed)
ATC patient to let her know about sleep study results, per DPR left detailed message. Patient is already scheduled for follow up. Nothing further needed at this time.  Next Appt With Pulmonology [facility] [place], MD)03/15/2021 at 10:45 AM

## 2021-03-07 ENCOUNTER — Other Ambulatory Visit: Payer: Medicare Other

## 2021-03-15 ENCOUNTER — Other Ambulatory Visit: Payer: Self-pay

## 2021-03-15 ENCOUNTER — Ambulatory Visit (INDEPENDENT_AMBULATORY_CARE_PROVIDER_SITE_OTHER): Payer: Medicare Other | Admitting: Pulmonary Disease

## 2021-03-15 ENCOUNTER — Other Ambulatory Visit (INDEPENDENT_AMBULATORY_CARE_PROVIDER_SITE_OTHER): Payer: Medicare Other

## 2021-03-15 ENCOUNTER — Encounter: Payer: Self-pay | Admitting: Pulmonary Disease

## 2021-03-15 VITALS — BP 118/70 | HR 77 | Temp 97.3°F | Ht 63.0 in | Wt 117.8 lb

## 2021-03-15 DIAGNOSIS — G4733 Obstructive sleep apnea (adult) (pediatric): Secondary | ICD-10-CM | POA: Diagnosis not present

## 2021-03-15 DIAGNOSIS — Z7189 Other specified counseling: Secondary | ICD-10-CM

## 2021-03-15 DIAGNOSIS — E785 Hyperlipidemia, unspecified: Secondary | ICD-10-CM | POA: Diagnosis not present

## 2021-03-15 DIAGNOSIS — R0609 Other forms of dyspnea: Secondary | ICD-10-CM

## 2021-03-15 NOTE — Progress Notes (Signed)
[facility]  Chief Complaint  Patient presents with   Follow-up    Home sleep study-    Constitutional:  BP 118/70 (BP Location: Left Arm, Patient Position: Sitting, Cuff Size: Normal)   Pulse 77   Temp (!) 97.3 F (36.3 C) (Oral)   Ht 5\' 3"  (1.6 m)   Wt 117 lb 12.8 oz (53.4 kg)   SpO2 100%   BMI 20.87 kg/m   Past Medical History:  Allergies, Anti cardiolipin Ab, Anxiety, Depression, GERD, HSV, HLD, Hypothyroidism, Osteopenia, Rosacea, Vertigo, Vit D deficiency  Past Surgical History:  She  has a past surgical history that includes Colonoscopy; Dilation and curettage of uterus (30 years ago); Cervical biopsy w/ loop electrode excision; Eye surgery; Colonoscopy with propofol (N/A, 03/30/2018); and polypectomy (N/A, 03/30/2018).  Brief Summary:  Sandra Horton is a 70 y.o. female with snoring and dyspnea.      Subjective:   CXR from 01/11/21 was normal.  PFT from 01/15/21 was normal.  Home sleep study from 02/27/21 showed moderate sleep apnea.   Physical Exam:   Appearance - well kempt   ENMT - no sinus tenderness, no oral exudate, no LAN, Mallampati 3 airway, no stridor, wears dentures  Respiratory - equal breath sounds bilaterally, no wheezing or rales  CV - s1s2 regular rate and rhythm, no murmurs  Ext - no clubbing, no edema  Skin - no rashes  Psych - normal mood and affect   Pulmonary testing:  PFT 01/15/21 >> FEV1 2.28 (106%), FEV1% 83, TLC 4.90 (90%), DLCO 94%  Sleep Tests:  HST 02/27/21 >> AHI 23.4, SpO2 low 81%.  Cardiac Tests:  Echo 11/10/20 >> EF 55 to 60%, grade 1 DD, mild MR  Social History:  She  reports that she has never smoked. She has never used smokeless tobacco. She reports that she does not drink alcohol and does not use drugs.  Family History:  Her family history includes Cancer in her mother; Colon cancer (age of onset: 14) in her maternal aunt; Diabetes in her daughter and mother; Heart attack  (age of onset: 18) in her father; Heart disease in an other family member; Hypertension in her father; Ovarian cancer (age of onset: 72) in her sister; Stroke in her father; Thyroid disease in her mother and sister.     Assessment/Plan:   Dyspnea on exertion. - recent pulmonary and cardiac assessment unrevealing - likely from deconditioning - discussed starting a regular exercise program  Obstructive sleep apnea. - reviewed her sleep study results - discussed treatment options - reviewed how sleep apnea can impact her health - will arrange for auto CPAP set up through [facility] - provided contact information for ENT in [place] to learn more about Inspire device   Time Spent Involved in Patient Care on Day of Examination:  31 minutes  Follow up:   Patient Instructions  Will arrange for auto CPAP set up with [facility]  Will provide contact information for ENT surgeon in [place] to learn more about Inspire device  Follow up in 5 months  Medication List:   Allergies as of 03/15/2021       Reactions   Penicillin V Potassium Anaphylaxis   Penicillins Anaphylaxis   Tetanus Toxoid Nausea And Vomiting        Medication List        Accurate as of March 15, 2021 11:19 AM. If you have any questions, ask your nurse or doctor.  ANTIVERT PO Take by mouth as needed.   aspirin 81 MG tablet Take 81 mg by mouth daily.   Calcium 500 MG tablet Take 500 mg by mouth in the morning and at bedtime.   estradiol 0.1 MG/GM vaginal cream Commonly known as: ESTRACE Insert 1 g vaginally once weekly   levothyroxine 88 MCG tablet Commonly known as: SYNTHROID Take 1 tablet (88 mcg total) by mouth daily before breakfast. Empty stomach   lovastatin 40 MG tablet Commonly known as: MEVACOR Take 1 tablet (40 mg total) by mouth at bedtime.   magnesium chloride 64 MG Tbec SR tablet Commonly known as: SLOW-MAG Take 1 tablet by mouth.   meloxicam 15 MG tablet Commonly known as:  MOBIC Take 1 tablet (15 mg total) by mouth daily as needed for pain.   MULTIVITAMIN ADULT PO   omeprazole 40 MG capsule Commonly known as: PRILOSEC Take 1 capsule (40 mg total) by mouth daily. Wait 3-4 hours after thyroid med. Take 30 minutes before food lunch/dinner   PARoxetine 20 MG tablet Commonly known as: PAXIL Take 1 tablet (20 mg total) by mouth daily.   polyethylene glycol powder 17 GM/SCOOP powder Commonly known as: GLYCOLAX/MIRALAX Take 17 g by mouth daily as needed.   valACYclovir 500 MG tablet Commonly known as: VALTREX Take 1 tab daily as preventive or BID for 3 days prn sx flare   Vitamin C 500 MG Chew        Signature:  Chesley Mires, MD [place] Pager - [phone] 03/15/2021, 11:19 AM

## 2021-03-15 NOTE — Patient Instructions (Signed)
Will arrange for auto CPAP set up with [facility]  Will provide contact information for ENT surgeon in [place] to learn more about Inspire device  Follow up in 5 months

## 2021-03-16 ENCOUNTER — Telehealth: Payer: Self-pay

## 2021-03-16 LAB — LIPID PANEL
Chol/HDL Ratio: 2.4 ratio (ref 0.0–4.4)
Cholesterol, Total: 161 mg/dL (ref 100–199)
HDL: 67 mg/dL (ref >39)
LDL Chol Calc (NIH): 82 mg/dL (ref 0–99)
Triglycerides: 63 mg/dL (ref 0–149)
VLDL Cholesterol Cal: 12 mg/dL (ref 5–40)

## 2021-03-16 MED ORDER — ROSUVASTATIN CALCIUM 40 MG PO TABS: 40.0000 mg | ORAL_TABLET | Freq: Every day | ORAL | 3 refills | Status: DC

## 2021-03-16 NOTE — Telephone Encounter (Signed)
Able to reach pt regarding her recent lab work, Cadence [employer] had a chance to review their results and advised   "LDL still above goal at 80. Would like to switch to Crestor 40mg , if ok with the patient."  Mrs. Fehrman will pick up [facility] order medications [facility], will d/c her lovastatin, otherwise all questions or concerns were address and no additional concerns at this time. Agreeable to plan, will call back for anything further.

## 2021-03-27 DIAGNOSIS — Z23 Encounter for immunization: Secondary | ICD-10-CM | POA: Diagnosis not present

## 2021-03-29 ENCOUNTER — Telehealth: Payer: Self-pay

## 2021-03-29 NOTE — Telephone Encounter (Signed)
Confirmed signed and faxed [employer] Prescriber Response Form. Form has been sent to scan.

## 2021-04-11 DIAGNOSIS — Z6821 Body mass index (BMI) 21.0-21.9, adult: Secondary | ICD-10-CM | POA: Diagnosis not present

## 2021-04-11 DIAGNOSIS — G4733 Obstructive sleep apnea (adult) (pediatric): Secondary | ICD-10-CM | POA: Diagnosis not present

## 2021-05-23 ENCOUNTER — Telehealth: Payer: Self-pay | Admitting: Internal Medicine

## 2021-05-23 ENCOUNTER — Telehealth: Payer: Medicare Other | Admitting: Family

## 2021-05-23 ENCOUNTER — Telehealth: Payer: Medicare Other | Admitting: Internal Medicine

## 2021-05-23 DIAGNOSIS — R6889 Other general symptoms and signs: Secondary | ICD-10-CM

## 2021-05-23 DIAGNOSIS — Z20828 Contact with and (suspected) exposure to other viral communicable diseases: Secondary | ICD-10-CM

## 2021-05-23 MED ORDER — OSELTAMIVIR PHOSPHATE 75 MG PO CAPS: 75.0000 mg | ORAL_CAPSULE | Freq: Two times a day (BID) | ORAL | 0 refills | Status: DC

## 2021-05-23 NOTE — Telephone Encounter (Signed)
Called and scheduled the Patient for virtual at 3:30. Patient wanting to come and be tested at drive up at 2:95 so that results are back at time of appointment.   Okay to test Patient for the POC flu? Would you like to test for anything else?

## 2021-05-23 NOTE — Telephone Encounter (Signed)
Rsv, poc flu Send out covid  Any strep sx's?

## 2021-05-23 NOTE — Progress Notes (Signed)
E visit for Flu like symptoms °  °We are sorry that you are not feeling well.  Here is how we plan to help! °Based on what you have shared with me it looks like you may have a respiratory virus that may be influenza. ° °Influenza or “the flu” is   an infection caused by a respiratory virus. The flu virus is highly contagious and persons who did not receive their yearly flu vaccination may “catch” the flu from close contact. ° °We have anti-viral medications to treat the viruses that cause this infection. They are not a “cure” and only shorten the course of the infection. These prescriptions are most effective when they are given within the first 2 days of “flu” symptoms. Antiviral medication are indicated if you have a high risk of complications from the flu. You should  also consider an antiviral medication if you are in close contact with someone who is at risk. These medications can help patients avoid complications from the flu  but have side effects that you should know. Possible side effects from Tamiflu or oseltamivir include nausea, vomiting, diarrhea, dizziness, headaches, eye redness, sleep problems or other respiratory symptoms. °You should not take Tamiflu if you have an allergy to oseltamivir or any to the ingredients in Tamiflu. ° °Based upon your symptoms and potential risk factors I have prescribed Oseltamivir (Tamiflu).  It has been sent to your designated pharmacy.  You will take one 75 mg capsule orally twice a day for the next 5 days. ° °ANYONE WHO HAS FLU SYMPTOMS SHOULD: °Stay home. The flu is highly contagious and going out or to work exposes others! °Be sure to drink plenty of fluids. Water is fine as well as fruit juices, sodas and electrolyte beverages. You may want to stay away from caffeine or alcohol. If you are nauseated, try taking small sips of liquids. How do you know if you are getting enough fluid? Your urine should be a pale yellow or almost colorless. °Get rest. °Taking a steamy  shower or using a humidifier may help nasal congestion and ease sore throat pain. Using a saline nasal spray works much the same way. °Cough drops, hard candies and sore throat lozenges may ease your cough. °Line up a caregiver. Have someone check on you regularly. ° ° °GET HELP RIGHT AWAY IF: °You cannot keep down liquids or your medications. °You become short of breath °Your fell like you are going to pass out or loose consciousness. °Your symptoms persist after you have completed your treatment plan °MAKE SURE YOU  °Understand these instructions. °Will watch your condition. °Will get help right away if you are not doing well or get worse. ° °Your e-visit answers were reviewed by a board certified advanced clinical practitioner to complete your personal care plan.  Depending on the condition, your plan could have included both over the counter or prescription medications. ° °If there is a problem please reply  once you have received a response from your provider. ° °Your safety is important to us.  If you have drug allergies check your prescription carefully.   ° °You can use MyChart to ask questions about today’s visit, request a non-urgent call back, or ask for a work or school excuse for 24 hours related to this e-Visit. If it has been greater than 24 hours you will need to follow up with your provider, or enter a new e-Visit to address those concerns. ° °You will get an e-mail in the next   two days asking about your experience.  I hope that your e-visit has been valuable and will speed your recovery. Thank you for using e-visits.  Approximately 5 minutes was spent documenting and reviewing patient's chart.    

## 2021-05-23 NOTE — Telephone Encounter (Signed)
Patient did Conservation officer, historic buildings and called back in to cancel her 3 30. Patient was given medication

## 2021-05-23 NOTE — Addendum Note (Signed)
Addended by: Orland Mustard on: 05/23/2021 08:07 AM   Modules accepted: Orders

## 2021-05-30 ENCOUNTER — Encounter: Payer: Self-pay | Admitting: Pulmonary Disease

## 2021-05-31 ENCOUNTER — Ambulatory Visit
Admission: RE | Admit: 2021-05-31 | Discharge: 2021-05-31 | Disposition: A | Discharge status: Home / Self Care | Payer: Medicare Other | Source: Ambulatory Visit | Attending: Adult Health | Admitting: Adult Health

## 2021-05-31 ENCOUNTER — Encounter: Payer: Self-pay | Admitting: Adult Health

## 2021-05-31 ENCOUNTER — Ambulatory Visit (INDEPENDENT_AMBULATORY_CARE_PROVIDER_SITE_OTHER): Payer: Medicare Other | Admitting: Adult Health

## 2021-05-31 ENCOUNTER — Other Ambulatory Visit: Payer: Self-pay

## 2021-05-31 VITALS — BP 118/78 | HR 76 | Temp 97.0°F | Ht 63.0 in | Wt 115.2 lb

## 2021-05-31 DIAGNOSIS — J209 Acute bronchitis, unspecified: Secondary | ICD-10-CM | POA: Diagnosis not present

## 2021-05-31 DIAGNOSIS — R0602 Shortness of breath: Secondary | ICD-10-CM | POA: Insufficient documentation

## 2021-05-31 DIAGNOSIS — R059 Cough, unspecified: Secondary | ICD-10-CM | POA: Diagnosis not present

## 2021-05-31 DIAGNOSIS — J439 Emphysema, unspecified: Secondary | ICD-10-CM | POA: Diagnosis not present

## 2021-05-31 MED ORDER — PREDNISONE 20 MG PO TABS: 20.0000 mg | ORAL_TABLET | Freq: Every day | ORAL | 0 refills | Status: DC

## 2021-05-31 MED ORDER — AZITHROMYCIN 250 MG PO TABS: ORAL_TABLET | ORAL | 0 refills | Status: AC

## 2021-05-31 MED ORDER — BENZONATATE 200 MG PO CAPS: 200.0000 mg | ORAL_CAPSULE | Freq: Three times a day (TID) | ORAL | 1 refills | Status: DC | PRN

## 2021-05-31 NOTE — Patient Instructions (Signed)
Z-Pak No. 1 take as directed Mucinex DM twice daily as needed for cough and congestion Tessalon Perles 3 times daily as needed for cough Prednisone 20 mg daily for 5 days Warm heat to the rib area Follow-up with Dr. Halford Chessman  as planned and as needed Please contact office for sooner follow up if symptoms do not improve or worsen or seek emergency care

## 2021-05-31 NOTE — Progress Notes (Signed)
$'@Patient'p$  ID: Sandra Horton, female    DOB: 08-19-1950, 70 y.o.   MRN: [id number]  Chief Complaint  Patient presents with   Follow-up    Referring provider: McLean-Scocuzza, Olivia Mackie *  HPI: 70 year old female never smoker seen for pulmonary consult January 11, 2021 for sleep consult and shortness of breath.  Found to have moderate obstructive sleep apnea  TEST/EVENTS :  PFT 01/15/21 >> FEV1 2.28 (106%), FEV1% 83, TLC 4.90 (90%), DLCO 94%   Sleep Tests:  HST 02/27/21 >> AHI 23.4, SpO2 low 81%.   Cardiac Tests:  Echo 11/10/20 >> EF 55 to 60%, grade 1 DD, mild MR  05/31/2021 Follow up: Cough  Patient presents for a work in visit.  Patient says she recently had the flu over the holiday season.  She has been having ongoing cough congestion and rib pain.  Complains of left rib cage that is tender to touch and also with taking a deep breath.  Worse at nighttime.  She is been using ibuprofen with minimal relief.  She was initially treated with Tamiflu.  Patient complains of coughing up thick green mucus.  Patient says that her fever has resolved.  She is eating well with no nausea vomiting diarrhea.  Has no hemoptysis.  Chest x-ray today shows no acute process.     Allergies  Allergen Reactions   Penicillin V Potassium Anaphylaxis   Penicillins Anaphylaxis   Tetanus Toxoid Nausea And Vomiting    Immunization History  Administered Date(s) Administered   Hepatitis B, adult 05/01/2017, 05/29/2017, 10/29/2017   Influenza, High Dose Seasonal PF 04/02/2018, 03/27/2021   Influenza, Seasonal, Injecte, Preservative Fre 03/03/2012   Influenza,inj,Quad PF,6+ Mos 06/15/2019   Influenza-Unspecified 03/23/2020   Pneumococcal Conjugate-13 02/22/2016, 04/21/2017   Pneumococcal Polysaccharide-23 04/22/2018   Zoster Recombinat (Shingrix) 02/14/2020   Zoster, Live 07/30/2012    Past Medical History:  Diagnosis Date   Allergy    Anti-cardiolipin antibody positive    Dr. Jefm Bryant    Anxiety     Arthritis    BRCA negative 2016   MyRisk neg    Depression    Family history of ovarian cancer 2016   MyRisk neg; affected sister is BRCA neg   GERD (gastroesophageal reflux disease)    Herpes simplex    Hyperlipidemia    Hypothyroidism    Osteopenia    DEXA 09/02/13    Rosacea    Ruptured disc, cervical    Vertigo    per pt cervicogenic Physical therapy helped    Vitamin D deficiency    Wears dentures    full upper    Tobacco History: Social History   Tobacco Use  Smoking Status Never  Smokeless Tobacco Never   Counseling given: Not Answered   Outpatient Medications Prior to Visit  Medication Sig Dispense Refill   Ascorbic Acid (VITAMIN C) 500 MG CHEW      aspirin 81 MG tablet Take 81 mg by mouth daily.     Calcium 500 MG tablet Take 500 mg by mouth in the morning and at bedtime.     estradiol (ESTRACE) 0.1 MG/GM vaginal cream Insert 1 g vaginally once weekly 42.5 g 3   Ibuprofen (MOTRIN PO) Take by mouth.     levothyroxine (SYNTHROID) 88 MCG tablet Take 1 tablet (88 mcg total) by mouth daily before breakfast. Empty stomach 90 tablet 3   magnesium chloride (SLOW-MAG) 64 MG TBEC SR tablet Take 1 tablet by mouth.     Meclizine HCl (ANTIVERT  PO) Take by mouth as needed.     Multiple Vitamins-Minerals (MULTIVITAMIN ADULT PO)      omeprazole (PRILOSEC) 40 MG capsule Take 1 capsule (40 mg total) by mouth daily. Wait 3-4 hours after thyroid med. Take 30 minutes before food lunch/dinner 90 capsule 3   oseltamivir (TAMIFLU) 75 MG capsule Take 1 capsule (75 mg total) by mouth 2 (two) times daily. 10 capsule 0   PARoxetine (PAXIL) 20 MG tablet Take 1 tablet (20 mg total) by mouth daily. 90 tablet 3   polyethylene glycol powder (GLYCOLAX/MIRALAX) powder Take 17 g by mouth daily as needed. 850 g 11   rosuvastatin (CRESTOR) 40 MG tablet Take 1 tablet (40 mg total) by mouth daily. 90 tablet 3   valACYclovir (VALTREX) 500 MG tablet Take 1 tab daily as preventive or BID for 3 days prn sx  flare 90 tablet 3   meloxicam (MOBIC) 15 MG tablet Take 1 tablet (15 mg total) by mouth daily as needed for pain. 90 tablet 3   No facility-administered medications prior to visit.     Review of Systems:   Constitutional:   No  weight loss, night sweats,  Fevers, chills, + fatigue, or  lassitude.  HEENT:   No headaches,  Difficulty swallowing,  Tooth/dental problems, or  Sore throat,                No sneezing, itching, ear ache,  +nasal congestion, post nasal drip,   CV:  No chest pain,  Orthopnea, PND, swelling in lower extremities, anasarca, dizziness, palpitations, syncope.   GI  No heartburn, indigestion, abdominal pain, nausea, vomiting, diarrhea, change in bowel habits, loss of appetite, bloody stools.   Resp:   No chest wall deformity  Skin: no rash or lesions.  GU: no dysuria, change in color of urine, no urgency or frequency.  No flank pain, no hematuria   MS:  No joint pain or swelling.  No decreased range of motion.  No back pain.    Physical Exam  BP 118/78 (BP Location: Left Arm, Patient Position: Sitting, Cuff Size: Normal)    Pulse 76    Temp (!) 97 F (36.1 C) (Oral)    Ht $R'5\' 3"'LX$  (1.6 m)    Wt 115 lb 3.2 oz (52.3 kg)    SpO2 100%    BMI 20.41 kg/m   GEN: A/Ox3; pleasant , NAD, well nourished    HEENT:  [place]/AT,   NOSE-clear, THROAT-clear, no lesions, no postnasal drip or exudate noted.   NECK:  Supple w/ fair ROM; no JVD; normal carotid impulses w/o bruits; no thyromegaly or nodules palpated; no lymphadenopathy.    RESP  Clear  P & A; w/o, wheezes/ rales/ or rhonchi. no accessory muscle use, no dullness to percussion, tender along lateral/anterior lower ribs   CARD:  RRR, no m/r/g, no peripheral edema, pulses intact, no cyanosis or clubbing.  GI:   Soft & nt; nml bowel sounds; no organomegaly or masses detected.   Musco: Warm bil, no deformities or joint swelling noted.   Neuro: alert, no focal deficits noted.    Skin: Warm, no lesions or  rashes    Lab Results:     BNP No results found for: BNP  ProBNP No results found for: PROBNP  Imaging: DG Chest 2 View  Result Date: 05/31/2021 CLINICAL DATA:  70 year old female with history of cough and shortness of breath for the past 9 days. EXAM: CHEST - 2 VIEW COMPARISON:  Chest  x-ray 01/11/2021. FINDINGS: Lung volumes are increased with emphysematous changes. No consolidative airspace disease. No pleural effusions. No pneumothorax. No pulmonary nodule or mass noted. Pulmonary vasculature and the cardiomediastinal silhouette are within normal limits. Atherosclerosis in the thoracic aorta. IMPRESSION: 1. No radiographic evidence of acute cardiopulmonary disease. 2. Emphysema. 3. Aortic atherosclerosis. Electronically Signed   By: Vinnie Langton M.D.   On: 05/31/2021 12:04      No flowsheet data found.  No results found for: NITRICOXIDE      Assessment & Plan:   Acute bronchitis Acute secondary bronchitis with recent influenza.  Chest x-ray today shows no sign of pneumonia.  She does have some pleuritic pain. Will treat with empiric antibiotics and short low-dose steroid burst. Supportive care for rib pain  Plan  Patient Instructions  Z-Pak No. 1 take as directed Mucinex DM twice daily as needed for cough and congestion Tessalon Perles 3 times daily as needed for cough Prednisone 20 mg daily for 5 days Warm heat to the rib area Follow-up with Dr. Halford Chessman  as planned and as needed Please contact office for sooner follow up if symptoms do not improve or worsen or seek emergency care          Rexene Edison, NP 05/31/2021

## 2021-05-31 NOTE — Assessment & Plan Note (Signed)
Acute secondary bronchitis with recent influenza.  Chest x-ray today shows no sign of pneumonia.  She does have some pleuritic pain. Will treat with empiric antibiotics and short low-dose steroid burst. Supportive care for rib pain  Plan  Patient Instructions  Z-Pak No. 1 take as directed Mucinex DM twice daily as needed for cough and congestion Tessalon Perles 3 times daily as needed for cough Prednisone 20 mg daily for 5 days Warm heat to the rib area Follow-up with Dr. Halford Chessman  as planned and as needed Please contact office for sooner follow up if symptoms do not improve or worsen or seek emergency care

## 2021-06-02 NOTE — Progress Notes (Signed)
Reviewed and agree with assessment/plan.   Chesley Mires, MD [facility] Pulmonary/Critical Care 06/02/2021, 11:05 AM Pager:  [phone]

## 2021-06-22 NOTE — Progress Notes (Signed)
PCP: McLean-Scocuzza, Nino Glow, MD   Chief Complaint  Patient presents with   Gynecologic Exam    No concerns    HPI:      [name] is a 71 y.o. [id number] who LMP was No LMP recorded. Patient is postmenopausal., presents today for her medicare annual examination.  Her menses are absent due to menopause. She does not have PMB. S/p thickened EM on u/s in past with neg EMB/SHGM. She does not have vasomotor sx.   Sex activity: single partner, contraception - post menopausal status. She does have vaginal dryness--sx relieved with estrace vag crm 1 g wkly and lubricants. Needs Rx RF.  Last Pap: 06/15/19 Results were: no abnormalities /neg HPV DNA 2016.  Hx of STDs: HSV, takes valtrex daily and prn. Gets sx about Q3 months, needs Rx RF.  Last mammogram: 08/29/20 with PCP, Results were: normal--routine follow-up in 12 months There is no FH of breast cancer. There is a FH of ovarian cancer in her sister. Her sister is BRCA neg and pt is MyRisk neg 2016. The patient does not do self-breast exams. Pt had neg GYN u/s for ovaries and neg ca-125 in past;  this screening no longer indicated/recommended.  Colonoscopy: 10/19 with Dr. Allen Norris; Repeat due after 5 years  DEXA: with PCP  Tobacco use: The patient denies current or previous tobacco use. Alcohol use: none  No drug use Exercise: very active with grandkids, no formal exercise though  She does get adequate calcium and Vitamin D in her diet.  Labs with PCP.    Past Medical History:  Diagnosis Date   Allergy    Anti-cardiolipin antibody positive    Dr. Jefm Bryant    Anxiety    Arthritis    BRCA negative 2016   MyRisk neg    Depression    Family history of ovarian cancer 2016   MyRisk neg; affected sister is BRCA neg   GERD (gastroesophageal reflux disease)    Herpes simplex    Hyperlipidemia    Hypothyroidism    Osteopenia    DEXA 09/02/13    Rosacea    Ruptured disc, cervical    Sleep apnea    Vertigo    per pt  cervicogenic Physical therapy helped    Vitamin D deficiency    Wears dentures    full upper    Past Surgical History:  Procedure Laterality Date   CERVICAL BIOPSY  W/ LOOP ELECTRODE EXCISION     COLONOSCOPY     COLONOSCOPY WITH PROPOFOL N/A 03/30/2018   Procedure: COLONOSCOPY WITH Biopsies;  Surgeon: [name], MD;  Location: [facility];  Service: Endoscopy;  Laterality: N/A;   DILATION AND CURETTAGE OF UTERUS  30 years ago   EYE SURGERY     b/l cataract    POLYPECTOMY N/A 03/30/2018   Procedure: POLYPECTOMY;  Surgeon: [name], MD;  Location: [facility];  Service: Endoscopy;  Laterality: N/A;    Family History  Problem Relation Age of Onset   Cancer Mother        kidney met to lungs    Diabetes Mother    Thyroid disease Mother    Heart disease Father    Hypertension Father    Stroke Father    Heart attack Father 56   Thyroid disease Sister    Ovarian cancer Sister 74       BRCA neg   Colon cancer Maternal Aunt 39   Diabetes Daughter  type 1    Breast cancer Neg Hx     Social History   Socioeconomic History   Marital status: Married    Spouse name: Not on file   Number of children: Not on file   Years of education: Not on file   Highest education level: Not on file  Occupational History   Not on file  Tobacco Use   Smoking status: Never   Smokeless tobacco: Never  Vaping Use   Vaping Use: Never used  Substance and Sexual Activity   Alcohol use: No    Alcohol/week: 0.0 standard drinks   Drug use: No   Sexual activity: Yes    Birth control/protection: Post-menopausal  Other Topics Concern   Not on file  Social History Narrative    [employer] [facility] [employer] retired as of 04/02/18    Social Determinants of Health   Financial Resource Strain: Low Risk    Difficulty of Paying Living Expenses: Not hard at all  Food Insecurity: No Food Insecurity   Worried About Charity fundraiser in the Last Year: Never true   Arboriculturist in  the Last Year: Never true  Transportation Needs: No Transportation Needs   Lack of Transportation (Medical): No   Lack of Transportation (Non-Medical): No  Physical Activity: Not on file  Stress: No Stress Concern Present   Feeling of Stress : Not at all  Social Connections: Unknown   Frequency of Communication with Friends and Family: More than three times a week   Frequency of Social Gatherings with Friends and Family: More than three times a week   Attends Religious Services: Not on Electrical engineer or Organizations: Not on file   Attends Archivist Meetings: Not on file   Marital Status: Not on file  Intimate Partner Violence: Not At Risk   Fear of Current or Ex-Partner: No   Emotionally Abused: No   Physically Abused: No   Sexually Abused: No    Current Meds  Medication Sig   Ascorbic Acid (VITAMIN C) 500 MG CHEW    aspirin 81 MG tablet Take 81 mg by mouth daily.   Calcium 500 MG tablet Take 500 mg by mouth in the morning and at bedtime.   Ibuprofen (MOTRIN PO) Take by mouth.   levothyroxine (SYNTHROID) 88 MCG tablet Take 1 tablet (88 mcg total) by mouth daily before breakfast. Empty stomach   magnesium chloride (SLOW-MAG) 64 MG TBEC SR tablet Take 1 tablet by mouth.   Meclizine HCl (ANTIVERT PO) Take by mouth as needed.   Multiple Vitamins-Minerals (MULTIVITAMIN ADULT PO)    omeprazole (PRILOSEC) 40 MG capsule Take 1 capsule (40 mg total) by mouth daily. Wait 3-4 hours after thyroid med. Take 30 minutes before food lunch/dinner   PARoxetine (PAXIL) 20 MG tablet Take 1 tablet (20 mg total) by mouth daily.   polyethylene glycol powder (GLYCOLAX/MIRALAX) powder Take 17 g by mouth daily as needed.   [DISCONTINUED] estradiol (ESTRACE) 0.1 MG/GM vaginal cream Insert 1 g vaginally once weekly   [DISCONTINUED] valACYclovir (VALTREX) 500 MG tablet Take 1 tab daily as preventive or BID for 3 days prn sx flare      ROS:  Review of Systems  Constitutional:   Negative for fatigue, fever and unexpected weight change.  Respiratory:  Negative for cough, shortness of breath and wheezing.   Cardiovascular:  Negative for chest pain, palpitations and leg swelling.  Gastrointestinal:  Negative for blood in  stool, constipation, diarrhea, nausea and vomiting.  Endocrine: Negative for cold intolerance, heat intolerance and polyuria.  Genitourinary:  Positive for dyspareunia. Negative for dysuria, flank pain, frequency, genital sores, hematuria, menstrual problem, pelvic pain, urgency, vaginal bleeding, vaginal discharge and vaginal pain.  Musculoskeletal:  Positive for arthralgias. Negative for back pain, joint swelling and myalgias.  Skin:  Positive for rash.  Neurological:  Positive for dizziness. Negative for syncope, light-headedness, numbness and headaches.  Hematological:  Negative for adenopathy.  Psychiatric/Behavioral:  Negative for agitation, confusion, sleep disturbance and suicidal ideas. The patient is not nervous/anxious.     Objective: BP 90/60    Ht _0  (1.6 m)    Wt 118 lb (53.5 kg)    BMI 20.90 kg/m    Physical Exam Constitutional:      Appearance: She is well-developed.  Genitourinary:     Vulva normal.     Right Labia: No rash, tenderness or lesions.    Left Labia: No tenderness, lesions or rash.    No vaginal discharge, erythema or tenderness.      Right Adnexa: not tender and no mass present.    Left Adnexa: not tender and no mass present.    No cervical motion tenderness, friability or polyp.     Uterus is not enlarged or tender.  Breasts:    Right: No mass, nipple discharge, skin change or tenderness.     Left: No mass, nipple discharge, skin change or tenderness.  Neck:     Thyroid: No thyromegaly.  Cardiovascular:     Rate and Rhythm: Normal rate and regular rhythm.     Heart sounds: Normal heart sounds. No murmur heard. Pulmonary:     Effort: Pulmonary effort is normal.     Breath sounds: Normal breath sounds.   Abdominal:     Palpations: Abdomen is soft.     Tenderness: There is no abdominal tenderness. There is no guarding or rebound.  Musculoskeletal:        General: Normal range of motion.     Cervical back: Normal range of motion.  Lymphadenopathy:     Cervical: No cervical adenopathy.  Neurological:     General: No focal deficit present.     Mental Status: She is alert and oriented to person, place, and time.     Cranial Nerves: No cranial nerve deficit.  Skin:    General: Skin is warm and dry.  Psychiatric:        Mood and Affect: Mood normal.        Behavior: Behavior normal.        Thought Content: Thought content normal.        Judgment: Judgment normal.  Vitals reviewed.    Assessment/Plan: Encounter for annual routine gynecological examination  Encounter for screening mammogram for malignant neoplasm of breast - Plan: MM 3D SCREEN BREAST BILATERAL; [facility]  Dyspareunia in female--improved with estrace and lubricants. F/u prn.   Vaginal dryness, menopausal - Plan: estradiol (ESTRACE) 0.1 MG/GM vaginal cream; Rx RF. Will RF in a year since exam not due for 2 yrs per Medicare  Postmenopausal atrophic vaginitis - Sx improved wtih estrace crm. Rx RF.  - Plan: estradiol (ESTRACE) 0.1 MG/GM vaginal cream; Will RF in 1 yr since exam not due for 2 yrs per Medicare  Herpes simplex vulvovaginitis - Plan: valACYclovir (VALTREX) 500 MG tablet; Rx RF. Will RF in 1 yr since exam not due for 2 yrs per Medicare.   Meds ordered  this encounter  Medications   valACYclovir (VALTREX) 500 MG tablet    Sig: Take 1 tab daily as preventive or BID for 3 days prn sx flare    Dispense:  90 tablet    Refill:  3    Order Specific Question:   Supervising Provider    Answer:   Gae Dry [[id number]]   estradiol (ESTRACE) 0.1 MG/GM vaginal cream    Sig: Insert 1 g vaginally once weekly    Dispense:  42.5 g    Refill:  1    Order Specific Question:   Supervising Provider    Answer:    Gae Dry [[id number]]            GYN counsel mammography screening, menopause, adequate intake of calcium and vitamin D, diet and exercise    F/U  Return in about 2 years (around 06/26/2023).   Sandra Mcdaris B. Oak Dorey, PA-C 06/25/2021 10:32 AM

## 2021-06-25 ENCOUNTER — Ambulatory Visit (INDEPENDENT_AMBULATORY_CARE_PROVIDER_SITE_OTHER): Payer: Medicare Other | Admitting: Obstetrics and Gynecology

## 2021-06-25 ENCOUNTER — Encounter: Payer: Self-pay | Admitting: Obstetrics and Gynecology

## 2021-06-25 ENCOUNTER — Other Ambulatory Visit: Payer: Self-pay

## 2021-06-25 VITALS — BP 90/60 | Ht 63.0 in | Wt 118.0 lb

## 2021-06-25 DIAGNOSIS — Z01419 Encounter for gynecological examination (general) (routine) without abnormal findings: Secondary | ICD-10-CM | POA: Diagnosis not present

## 2021-06-25 DIAGNOSIS — N951 Menopausal and female climacteric states: Secondary | ICD-10-CM | POA: Insufficient documentation

## 2021-06-25 DIAGNOSIS — N952 Postmenopausal atrophic vaginitis: Secondary | ICD-10-CM

## 2021-06-25 DIAGNOSIS — Z1231 Encounter for screening mammogram for malignant neoplasm of breast: Secondary | ICD-10-CM

## 2021-06-25 DIAGNOSIS — A6004 Herpesviral vulvovaginitis: Secondary | ICD-10-CM | POA: Insufficient documentation

## 2021-06-25 DIAGNOSIS — N941 Unspecified dyspareunia: Secondary | ICD-10-CM

## 2021-06-25 MED ORDER — VALACYCLOVIR HCL 500 MG PO TABS: ORAL_TABLET | ORAL | 3 refills | Status: DC

## 2021-06-25 MED ORDER — ESTRADIOL 0.1 MG/GM VA CREA: TOPICAL_CREAM | VAGINAL | 1 refills | Status: DC

## 2021-06-25 NOTE — Patient Instructions (Signed)
I value your feedback and you entrusting us with your care. If you get a [facility] patient survey, I would appreciate you taking the time to let us know about your experience today. Thank you!  [facility]: [phone]      

## 2021-08-08 DIAGNOSIS — G4733 Obstructive sleep apnea (adult) (pediatric): Secondary | ICD-10-CM | POA: Diagnosis not present

## 2021-08-09 ENCOUNTER — Telehealth: Payer: Medicare Other | Admitting: Family Medicine

## 2021-08-09 DIAGNOSIS — D225 Melanocytic nevi of trunk: Secondary | ICD-10-CM | POA: Diagnosis not present

## 2021-08-09 DIAGNOSIS — D2262 Melanocytic nevi of left upper limb, including shoulder: Secondary | ICD-10-CM | POA: Diagnosis not present

## 2021-08-09 DIAGNOSIS — L720 Epidermal cyst: Secondary | ICD-10-CM | POA: Diagnosis not present

## 2021-08-09 DIAGNOSIS — D2261 Melanocytic nevi of right upper limb, including shoulder: Secondary | ICD-10-CM | POA: Diagnosis not present

## 2021-08-09 DIAGNOSIS — J029 Acute pharyngitis, unspecified: Secondary | ICD-10-CM | POA: Diagnosis not present

## 2021-08-09 DIAGNOSIS — L821 Other seborrheic keratosis: Secondary | ICD-10-CM | POA: Diagnosis not present

## 2021-08-09 DIAGNOSIS — X32XXXA Exposure to sunlight, initial encounter: Secondary | ICD-10-CM | POA: Diagnosis not present

## 2021-08-09 DIAGNOSIS — D2271 Melanocytic nevi of right lower limb, including hip: Secondary | ICD-10-CM | POA: Diagnosis not present

## 2021-08-09 DIAGNOSIS — D2272 Melanocytic nevi of left lower limb, including hip: Secondary | ICD-10-CM | POA: Diagnosis not present

## 2021-08-09 DIAGNOSIS — L570 Actinic keratosis: Secondary | ICD-10-CM | POA: Diagnosis not present

## 2021-08-09 MED ORDER — AZITHROMYCIN 250 MG PO TABS: ORAL_TABLET | ORAL | 0 refills | Status: AC

## 2021-08-09 NOTE — Progress Notes (Signed)
E-Visit for Sore Throat - Strep Symptoms  We are sorry that you are not feeling well.  Here is how we plan to help!  Based on what you have shared with me it is likely that you have strep pharyngitis.  Strep pharyngitis is inflammation and infection in the back of the throat.  This is an infection cause by bacteria and is treated with antibiotics.  I have prescribed Azithromycin 250 mg two tablets today and then one daily for 4 additional days. For throat pain, we recommend over the counter oral pain relief medications such as acetaminophen or aspirin, or anti-inflammatory medications such as ibuprofen or naproxen sodium. Topical treatments such as oral throat lozenges or sprays may be used as needed. Strep infections are not as easily transmitted as other respiratory infections, however we still recommend that you avoid close contact with loved ones, especially the very young and elderly.  Remember to wash your hands thoroughly throughout the day as this is the number one way to prevent the spread of infection and wipe down door knobs and counters with disinfectant.   Home Care: Only take medications as instructed by your medical team. Complete the entire course of an antibiotic. Do not take these medications with alcohol. A steam or ultrasonic humidifier can help congestion.  You can place a towel over your head and breathe in the steam from hot water coming from a faucet. Avoid close contacts especially the very young and the elderly. Cover your mouth when you cough or sneeze. Always remember to wash your hands.  Get Help Right Away If: You develop worsening fever or sinus pain. You develop a severe head ache or visual changes. Your symptoms persist after you have completed your treatment plan.  Make sure you Understand these instructions. Will watch your condition. Will get help right away if you are not doing well or get worse.   Thank you for choosing an e-visit.  Your e-visit  answers were reviewed by a board certified advanced clinical practitioner to complete your personal care plan. Depending upon the condition, your plan could have included both over the counter or prescription medications.  Please review your pharmacy choice. Make sure the pharmacy is open so you can pick up prescription now. If there is a problem, you may contact your provider through MyChart messaging and have the prescription routed to another pharmacy.  Your safety is important to us. If you have drug allergies check your prescription carefully.   For the next 24 hours you can use MyChart to ask questions about today's visit, request a non-urgent call back, or ask for a work or school excuse. You will get an email in the next two days asking about your experience. I hope that your e-visit has been valuable and will speed your recovery.  I provided 5 minutes of non face-to-face time during this encounter for chart review, medication and order placement, as well as and documentation.    

## 2021-08-14 DIAGNOSIS — U071 COVID-19: Secondary | ICD-10-CM | POA: Diagnosis not present

## 2021-08-24 ENCOUNTER — Other Ambulatory Visit: Payer: Self-pay | Admitting: Otolaryngology

## 2021-08-27 NOTE — Progress Notes (Deleted)
?Cardiology Office Note:   ? ?Date:  08/27/2021  ? ?ID:  Sandra Horton, DOB [birth year], MRN [id number] ? ?PCP:  McLean-Scocuzza, Sandra Glow, MD  ?[facility] Cardiologist:  None  ?[place] Electrophysiologist:  None  ? ?Referring MD: McLean-Scocuzza, Sandra Horton *  ? ?Chief Complaint: 6 month follow-up ? ?History of Present Illness:   ? ?Sandra Horton is a 71 y.o. female with a hx of atherosclerosis at request of Dr. Terese Door, HLD, depression, anxiety, aortic atherosclerosis on CT who is being seen for follow-up.  ?  ?She saw Dr. Saunders Revel 10/11/20 for SOB and chest pain for the last 1.5 years. Myoview and echo were ordered. Myoview showed TWI inversion during stress, overall normal, low risk, no evidence of ischemia. Echo showed LVEF 55-60%, G1DD, normal RV function and PA pressure, mild MR, mild late prolapse of posterior leaflet of MV, mod TR, mild thicking of aortic valve, trivial AI, mild sclerosis aortic valve.   ? ?Seen 11/16/20 via televisit for review of myoview and echo, ordered for sob and cest pain.  Myoview was low risk with no ischemia. Echo showed preserved EF, G1DD, and mild valvular disease. She did not want to undergo invasive procedures wince symptoms were not limiting her function.  ? ?Last seen 02/28/21 and had stable atypical chest pain and DOE. Symptoms were not limiting function, so no plan for further work-up at this time. ? ?Today,  ? ?Past Medical History:  ?Diagnosis Date  ? Allergy   ? Anti-cardiolipin antibody positive   ? Dr. Jefm Bryant   ? Anxiety   ? Arthritis   ? BRCA negative 2016  ? MyRisk neg   ? Depression   ? Family history of ovarian cancer 2016  ? MyRisk neg; affected sister is BRCA neg  ? GERD (gastroesophageal reflux disease)   ? Herpes simplex   ? Hyperlipidemia   ? Hypothyroidism   ? Osteopenia   ? DEXA 09/02/13   ? Rosacea   ? Ruptured disc, cervical   ? Sleep apnea   ? Vertigo   ? per pt cervicogenic Physical therapy helped   ? Vitamin D deficiency   ? Wears dentures   ? full  upper  ? ? ?Past Surgical History:  ?Procedure Laterality Date  ? CERVICAL BIOPSY  W/ LOOP ELECTRODE EXCISION    ? COLONOSCOPY    ? COLONOSCOPY WITH PROPOFOL N/A 03/30/2018  ? Procedure: COLONOSCOPY WITH Biopsies;  Surgeon: [name], MD;  Location: [facility];  Service: Endoscopy;  Laterality: N/A;  ? DILATION AND CURETTAGE OF UTERUS  30 years ago  ? EYE SURGERY    ? b/l cataract   ? POLYPECTOMY N/A 03/30/2018  ? Procedure: POLYPECTOMY;  Surgeon: [name], MD;  Location: [facility];  Service: Endoscopy;  Laterality: N/A;  ? ? ?Current Medications: ?No outpatient medications have been marked as taking for the 08/28/21 encounter (Appointment) with Kathlen Mody, Aigner Horseman H, PA-C.  ?  ? ?Allergies:   Penicillin v potassium, Penicillins, and Tetanus toxoid  ? ?Social History  ? ?Socioeconomic History  ? Marital status: Married  ?  Spouse name: Not on file  ? Number of children: Not on file  ? Years of education: Not on file  ? Highest education level: Not on file  ?Occupational History  ? Not on file  ?Tobacco Use  ? Smoking status: Never  ? Smokeless tobacco: Never  ?Vaping Use  ? Vaping Use: Never used  ?Substance and Sexual Activity  ? Alcohol  use: No  ?  Alcohol/week: 0.0 standard drinks  ? Drug use: No  ? Sexual activity: Yes  ?  Birth control/protection: Post-menopausal  ?Other Topics Concern  ? Not on file  ?Social History Narrative  ?  [facility] retired as of 04/02/18   ? ?Social Determinants of Health  ? ?Financial Resource Strain: Low Risk   ? Difficulty of Paying Living Expenses: Not hard at all  ?Food Insecurity: No Food Insecurity  ? Worried About Charity fundraiser in the Last Year: Never true  ? Ran Out of Food in the Last Year: Never true  ?Transportation Needs: No Transportation Needs  ? Lack of Transportation (Medical): No  ? Lack of Transportation (Non-Medical): No  ?Physical Activity: Not on file  ?Stress: No Stress Concern Present  ? Feeling of Stress : Not at all  ?Social  Connections: Unknown  ? Frequency of Communication with Friends and Family: More than three times a week  ? Frequency of Social Gatherings with Friends and Family: More than three times a week  ? Attends Religious Services: Not on file  ? Active Member of Clubs or Organizations: Not on file  ? Attends Archivist Meetings: Not on file  ? Marital Status: Not on file  ?  ? ?Family History: ?The patient's family history includes Cancer in her mother; Colon cancer (age of onset: 73) in her maternal aunt; Diabetes in her daughter and mother; Heart attack (age of onset: 40) in her father; Heart disease in her father; Hypertension in her father; Ovarian cancer (age of onset: 69) in her sister; Stroke in her father; Thyroid disease in her mother and sister. There is no history of Breast cancer. ? ?ROS:   ?Please see the history of present illness.    ? All other systems reviewed and are negative. ? ?EKGs/Labs/Other Studies Reviewed:   ? ?The following studies were reviewed today: ? ?Echo 11/10/20 ?1. Left ventricular ejection fraction, by estimation, is 55 to 60%. The  ?left ventricle has normal function. The left ventricle has no regional  ?wall motion abnormalities. Left ventricular diastolic parameters are  ?consistent with Grade I diastolic  ?dysfunction (impaired relaxation).  ? 2. Right ventricular systolic function is normal. The right ventricular  ?size is normal. There is normal pulmonary artery systolic pressure.  ? 3. The mitral valve is abnormal. Mild mitral valve regurgitation. No  ?evidence of mitral stenosis. There is mild late systolic prolapse of  ?posterior leaflet of the mitral valve.  ? 4. Tricuspid valve regurgitation is moderate.  ? 5. The aortic valve is tricuspid. There is mild thickening of the aortic  ?valve. Aortic valve regurgitation is trivial. Mild aortic valve sclerosis  ?is present, with no evidence of aortic valve stenosis.  ? 6. The inferior vena cava is normal in size with greater  than 50%  ?respiratory variability, suggesting right atrial pressure of 3 mmHg.  ?  ?Myoview 10/18/20 ?Narrative & Impression  ?T wave inversion was noted during stress in the III, aVF, V5, V6 and II leads. ?There was no ST segment deviation noted during stress. ?The study is normal. ?This is a low risk study. ?The left ventricular ejection fraction is normal visually although calculated value is 43%. recommend correlation with echo ?There is no evidence for ischemia  ? ? ?EKG:  EKG is *** ordered today.  The ekg ordered today demonstrates *** ? ?Recent Labs: ?09/21/2020: ALT 20; BUN 13; Creatinine, Ser 0.84; Hemoglobin 13.0; Platelets 256.0;  Potassium 4.1; Sodium 140; TSH 2.35  ?Recent Lipid Panel ?   ?Component Value Date/Time  ? CHOL 161 03/15/2021 1009  ? TRIG 63 03/15/2021 1009  ? HDL 67 03/15/2021 1009  ? CHOLHDL 2.4 03/15/2021 1009  ? CHOLHDL 2 09/21/2020 1057  ? VLDL 14.2 09/21/2020 1057  ? [place] 82 03/15/2021 1009  ? ? ? ?Risk Assessment/Calculations:   ?{Does this patient have ATRIAL FIBRILLATION?[phone]} ? ? ?Physical Exam:   ? ?VS:  There were no vitals taken for this visit.   ? ?Wt Readings from Last 3 Encounters:  ?06/25/21 118 lb (53.5 kg)  ?05/31/21 115 lb 3.2 oz (52.3 kg)  ?03/15/21 117 lb 12.8 oz (53.4 kg)  ?  ? ?GEN: *** Well nourished, well developed in no acute distress ?HEENT: Normal ?NECK: No JVD; No carotid bruits ?LYMPHATICS: No lymphadenopathy ?CARDIAC: ***RRR, no murmurs, rubs, gallops ?RESPIRATORY:  Clear to auscultation without rales, wheezing or rhonchi  ?ABDOMEN: Soft, non-tender, non-distended ?MUSCULOSKELETAL:  No edema; No deformity  ?SKIN: Warm and dry ?NEUROLOGIC:  Alert and oriented x 3 ?PSYCHIATRIC:  Normal affect  ? ?ASSESSMENT:   ? ?No diagnosis found. ?PLAN:   ? ?In order of problems listed above: ? ?SOB/Chest pain ? ?HLD ? ?HFpEF ?Valvular disease ? ? ?Disposition: Follow up {follow up:15908} with ***  ? ?Shared Decision Making/Informed Consent   ?{Are you ordering a CV  Procedure (e.g. stress test, cath, DCCV, TEE, etc)?   Press F2        :[id number]}  ? ? ?Signed, ?Amore Grater Ninfa Meeker, PA-C  ?08/27/2021 12:34 PM    ?[place]  ?

## 2021-08-28 ENCOUNTER — Ambulatory Visit: Payer: Medicare Other | Admitting: Medical

## 2021-08-30 ENCOUNTER — Ambulatory Visit
Admission: RE | Admit: 2021-08-30 | Discharge: 2021-08-30 | Disposition: A | Discharge status: Home / Self Care | Payer: Medicare Other | Source: Ambulatory Visit | Attending: Obstetrics and Gynecology | Admitting: Obstetrics and Gynecology

## 2021-08-30 DIAGNOSIS — Z1231 Encounter for screening mammogram for malignant neoplasm of breast: Secondary | ICD-10-CM | POA: Insufficient documentation

## 2021-09-14 ENCOUNTER — Other Ambulatory Visit: Payer: Self-pay

## 2021-09-14 ENCOUNTER — Encounter (HOSPITAL_BASED_OUTPATIENT_CLINIC_OR_DEPARTMENT_OTHER): Payer: Self-pay | Admitting: Otolaryngology

## 2021-09-14 DIAGNOSIS — U071 COVID-19: Secondary | ICD-10-CM | POA: Diagnosis not present

## 2021-09-18 DIAGNOSIS — G4733 Obstructive sleep apnea (adult) (pediatric): Secondary | ICD-10-CM | POA: Diagnosis not present

## 2021-09-19 ENCOUNTER — Ambulatory Visit (INDEPENDENT_AMBULATORY_CARE_PROVIDER_SITE_OTHER): Payer: Medicare Other

## 2021-09-19 VITALS — Ht 63.0 in | Wt 120.0 lb

## 2021-09-19 DIAGNOSIS — Z0000 Encounter for general adult medical examination without abnormal findings: Secondary | ICD-10-CM

## 2021-09-19 NOTE — Progress Notes (Signed)
Subjective:   Sandra Horton is a 71 y.o. female who presents for Medicare Annual (Subsequent) preventive examination.  Review of Systems    No ROS.  Medicare Wellness Virtual Visit.  Visual/audio telehealth visit, UTA vital signs.   See social history for additional risk factors.         Objective:    Today's Vitals   09/19/21 1533  Weight: 120 lb (54.4 kg)  Height: 5\' 3"  (1.6 m)   Body mass index is 21.26 kg/m.     09/19/2021    3:39 PM 09/14/2021    2:55 PM 09/18/2020    3:41 PM 09/15/2019    3:46 PM 03/30/2018    8:28 AM 07/07/2016    5:55 PM 07/07/2016    8:56 AM  Advanced Directives  Does Patient Have a Medical Advance Directive? Yes Yes Yes Yes Yes Yes No  Type of Best boy of [place];Living will Living will;Healthcare Power of [employer] Power of [place];Living will Healthcare Power of Attorney   Does patient want to make changes to medical advance directive?  No - Patient declined No - Patient declined No - Patient declined No - Patient declined No - Patient declined   Copy of Healthcare Power of Attorney in Chart?  No - copy requested No - copy requested No - copy requested No - copy requested No - copy requested   Would patient like information on creating a medical advance directive?  No - Patient declined    No - Patient declined Yes (ED - Information included in AVS)    Current Medications (verified) Outpatient Encounter Medications as of 09/19/2021  Medication Sig   Ascorbic Acid (VITAMIN C) 500 MG CHEW    aspirin 81 MG tablet Take 81 mg by mouth daily.   Calcium 500 MG tablet Take 500 mg by mouth in the morning and at bedtime.   cholecalciferol (VITAMIN D3) 25 MCG (1000 UNIT) tablet Take 1,000 Units by mouth daily.   estradiol (ESTRACE) 0.1 MG/GM vaginal cream Insert 1 g vaginally once weekly   Ibuprofen (MOTRIN PO) Take by mouth.   levothyroxine (SYNTHROID) 88 MCG tablet Take 1 tablet (88 mcg total) by mouth daily before  breakfast. Empty stomach   magnesium chloride (SLOW-MAG) 64 MG TBEC SR tablet Take 1 tablet by mouth.   Meclizine HCl (ANTIVERT PO) Take by mouth as needed.   Multiple Vitamins-Minerals (MULTIVITAMIN ADULT PO)    omeprazole (PRILOSEC) 40 MG capsule Take 1 capsule (40 mg total) by mouth daily. Wait 3-4 hours after thyroid med. Take 30 minutes before food lunch/dinner   PARoxetine (PAXIL) 20 MG tablet Take 1 tablet (20 mg total) by mouth daily.   polyethylene glycol powder (GLYCOLAX/MIRALAX) powder Take 17 g by mouth daily as needed.   rosuvastatin (CRESTOR) 40 MG tablet Take 1 tablet (40 mg total) by mouth daily.   valACYclovir (VALTREX) 500 MG tablet Take 1 tab daily as preventive or BID for 3 days prn sx flare   No facility-administered encounter medications on file as of 09/19/2021.    Allergies (verified) Penicillin v potassium, Penicillins, and Tetanus toxoid   History: Past Medical History:  Diagnosis Date   Allergy    Anti-cardiolipin antibody positive    Dr. Gavin Potters    Anxiety    Arthritis    BRCA negative 2016   MyRisk neg    Depression    Family history of ovarian cancer 2016   MyRisk neg; affected sister is  BRCA neg   GERD (gastroesophageal reflux disease)    Herpes simplex    Hyperlipidemia    Hypothyroidism    Osteopenia    DEXA 09/02/13    Rosacea    Ruptured disc, cervical    Sleep apnea    Vertigo    per pt cervicogenic Physical therapy helped    Vitamin D deficiency    Wears dentures    full upper   Past Surgical History:  Procedure Laterality Date   CERVICAL BIOPSY  W/ LOOP ELECTRODE EXCISION     COLONOSCOPY     COLONOSCOPY WITH PROPOFOL N/A 03/30/2018   Procedure: COLONOSCOPY WITH Biopsies;  Surgeon: Midge Minium, MD;  Location: [facility];  Service: Endoscopy;  Laterality: N/A;   DILATION AND CURETTAGE OF UTERUS  30 years ago   EYE SURGERY     b/l cataract    POLYPECTOMY N/A 03/30/2018   Procedure: POLYPECTOMY;  Surgeon: Midge Minium,  MD;  Location: [facility];  Service: Endoscopy;  Laterality: N/A;   Family History  Problem Relation Age of Onset   Cancer Mother        kidney met to lungs    Diabetes Mother    Thyroid disease Mother    Heart disease Father    Hypertension Father    Stroke Father    Heart attack Father 39   Thyroid disease Sister    Ovarian cancer Sister 65       BRCA neg   Colon cancer Maternal Aunt 42   Diabetes Daughter        type 1    Breast cancer Neg Hx    Social History   Socioeconomic History   Marital status: Married    Spouse name: Not on file   Number of children: Not on file   Years of education: Not on file   Highest education level: Not on file  Occupational History   Not on file  Tobacco Use   Smoking status: Never   Smokeless tobacco: Never  Vaping Use   Vaping Use: Never used  Substance and Sexual Activity   Alcohol use: No    Alcohol/week: 0.0 standard drinks   Drug use: No   Sexual activity: Yes    Birth control/protection: Post-menopausal  Other Topics Concern   Not on file  Social History Narrative    [facility] retired as of 04/02/18    Social Determinants of Health   Financial Resource Strain: Low Risk    Difficulty of Paying Living Expenses: Not hard at all  Food Insecurity: No Food Insecurity   Worried About Programme researcher, broadcasting/film/video in the Last Year: Never true   Barista in the Last Year: Never true  Transportation Needs: No Transportation Needs   Lack of Transportation (Medical): No   Lack of Transportation (Non-Medical): No  Physical Activity: Sufficiently Active   Days of Exercise per Week: 5 days   Minutes of Exercise per Session: 30 min  Stress: No Stress Concern Present   Feeling of Stress : Not at all  Social Connections: Unknown   Frequency of Communication with Friends and Family: More than three times a week   Frequency of Social Gatherings with Friends and Family: More than three times a week   Attends Religious  Services: Not on file   Active Member of Clubs or Organizations: Not on file   Attends Banker Meetings: Not on file   Marital Status: Not  on file    Tobacco Counseling Counseling given: Not Answered   Clinical Intake:  Pre-visit preparation completed: Yes        Diabetes: No  How often do you need to have someone help you when you read instructions, pamphlets, or other written materials from your doctor or pharmacy?: 1 - Never    Interpreter Needed?: No      Activities of Daily Living    09/19/2021    3:40 PM  In your present state of health, do you have any difficulty performing the following activities:  Hearing? 0  Vision? 0  Difficulty concentrating or making decisions? 0  Walking or climbing stairs? 0  Dressing or bathing? 0  Doing errands, shopping? 0  Preparing Food and eating ? N  Using the Toilet? N  In the past six months, have you accidently leaked urine? N  Do you have problems with loss of bowel control? N  Managing your Medications? N  Managing your Finances? N  Housekeeping or managing your Housekeeping? N    Patient Care Team: McLean-Scocuzza, Pasty Spillers, MD as PCP - General (Internal Medicine)  Indicate any recent Medical Services you may have received from other than [facility] providers in the past year (date may be approximate).     Assessment:   This is a routine wellness examination for [place].  Virtual Visit via Telephone Note  I connected with  Sentoria Etris Sharpe on 09/19/21 at  3:30 PM EDT by telephone and verified that I am speaking with the correct person using two identifiers.  Persons participating in the virtual visit: patient/Nurse Health Advisor   I discussed the limitations of performing an evaluation and management service by telehealth. The patient expressed understanding and agreed to proceed. We continued and completed visit with audio only. Some vital signs may be absent or patient reported.   Hearing/Vision  screen Hearing Screening - Comments:: Patient is able to hear conversational tones without difficulty. No issues reported. Vision Screening - Comments:: They have seen their ophthalmologist in the last 12 months.   Dietary issues and exercise activities discussed: Current Exercise Habits: Home exercise routine   Goals Addressed             This Visit's Progress    Follow up with Primary Care Provider       As needed       Depression Screen    09/19/2021    3:36 PM 09/18/2020    3:37 PM 10/05/2019    8:10 AM 09/15/2019    3:47 PM 04/06/2019    8:02 AM 04/21/2017    1:25 PM 11/09/2015    9:21 AM  PHQ 2/9 Scores  PHQ - 2 Score 0 0 0 0 0 0 0  PHQ- 9 Score      0     Fall Risk    09/19/2021    3:40 PM 09/18/2020    3:43 PM 10/05/2019    8:10 AM 09/15/2019    3:47 PM 04/06/2019    8:01 AM  Fall Risk   Falls in the past year? 0 0 0 0 0  Number falls in past yr: 0 0 0    Injury with Fall?  0 0    Follow up Falls evaluation completed Falls evaluation completed Falls evaluation completed Falls evaluation completed    FALL RISK PREVENTION PERTAINING TO THE HOME:  Home free of loose throw rugs in walkways, pet beds, electrical cords, etc? Yes  Adequate lighting in  your home to reduce risk of falls? Yes   ASSISTIVE DEVICES UTILIZED TO PREVENT FALLS:  Life alert? No  Use of a cane, walker or w/c? No   TIMED UP AND GO: Was the test performed? No .   Cognitive Function: Patient is alert and oriented x3.     09/15/2019    3:48 PM  MMSE - Mini Mental State Exam  Not completed: Unable to complete        Immunizations Immunization History  Administered Date(s) Administered   Hepatitis B, adult 05/01/2017, 05/29/2017, 10/29/2017   Influenza, High Dose Seasonal PF 04/02/2018, 03/27/2021   Influenza, Seasonal, Injecte, Preservative Fre 03/03/2012   Influenza,inj,Quad PF,6+ Mos 06/15/2019   Influenza-Unspecified 03/23/2020   Pneumococcal Conjugate-13 02/22/2016, 04/21/2017    Pneumococcal Polysaccharide-23 04/22/2018   Zoster Recombinat (Shingrix) 02/14/2020   Zoster, Live 07/30/2012   Shingrix vaccine- agrees to update immunization record as completed.   Screening Tests Health Maintenance  Topic Date Due   Zoster Vaccines- Shingrix (2 of 2) 09/24/2021 (Originally 04/10/2020)   INFLUENZA VACCINE  01/01/2022   COLONOSCOPY (Pts 45-12yrs Insurance coverage will need to be confirmed)  03/31/2023   MAMMOGRAM  08/31/2023   Pneumonia Vaccine 67+ Years old  Completed   DEXA SCAN  Completed   Hepatitis C Screening  Completed   HPV VACCINES  Aged Out   COVID-19 Vaccine  Discontinued   Health Maintenance There are no preventive care reminders to display for this patient.  Lung Cancer Screening: (Low Dose CT Chest recommended if Age 88-80 years, 30 pack-year currently smoking OR have quit w/in 15years.) does not qualify.   Vision Screening: Recommended annual ophthalmology exams for early detection of glaucoma and other disorders of the eye.  Dental Screening: Recommended annual dental exams for proper oral hygiene  Community Resource Referral / Chronic Care Management: CRR required this visit?  No   CCM required this visit?  No      Plan:   Keep all routine maintenance appointments.   I have personally reviewed and noted the following in the patient's chart:   Medical and social history Use of alcohol, tobacco or illicit drugs  Current medications and supplements including opioid prescriptions.  Functional ability and status Nutritional status Physical activity Advanced directives List of other physicians Hospitalizations, surgeries, and ER visits in previous 12 months Vitals Screenings to include cognitive, depression, and falls Referrals and appointments  In addition, I have reviewed and discussed with patient certain preventive protocols, quality metrics, and best practice recommendations. A written personalized care plan for preventive  services as well as general preventive health recommendations were provided to patient.     Ashok Pall, LPN   1/61/0960

## 2021-09-19 NOTE — Patient Instructions (Addendum)
?  Ms. Bolinger , ?Thank you for taking time to come for your Medicare Wellness Visit. I appreciate your ongoing commitment to your health goals. Please review the following plan we discussed and let me know if I can assist you in the future.  ? ?These are the goals we discussed: ? Goals   ? ?  Follow up with Primary Care Provider   ?  As needed ?  ? ?  ?  ?This is a list of the screening recommended for you and due dates:  ?Health Maintenance  ?Topic Date Due  ? Zoster (Shingles) Vaccine (2 of 2) 09/24/2021*  ? Flu Shot  01/01/2022  ? Colon Cancer Screening  03/31/2023  ? Mammogram  08/31/2023  ? Pneumonia Vaccine  Completed  ? DEXA scan (bone density measurement)  Completed  ? Hepatitis C Screening: USPSTF Recommendation to screen - Ages 57-79 yo.  Completed  ? HPV Vaccine  Aged Out  ? COVID-19 Vaccine  Discontinued  ?*Topic was postponed. The date shown is not the original due date.  ?  ?

## 2021-09-20 ENCOUNTER — Encounter: Payer: Self-pay | Admitting: Pulmonary Disease

## 2021-09-20 ENCOUNTER — Ambulatory Visit (INDEPENDENT_AMBULATORY_CARE_PROVIDER_SITE_OTHER): Payer: Medicare Other | Admitting: Pulmonary Disease

## 2021-09-20 VITALS — BP 110/70 | HR 68 | Temp 97.5°F | Ht 63.0 in | Wt 116.4 lb

## 2021-09-20 DIAGNOSIS — G4733 Obstructive sleep apnea (adult) (pediatric): Secondary | ICD-10-CM

## 2021-09-20 NOTE — Progress Notes (Signed)
? ?[facility] ? ?Chief Complaint  ?Patient presents with  ? Follow-up  ?  Wearing cpap avg 5-6hr nightly. Mask is leaking.  ? ? ?Constitutional:  ?BP 110/70 (BP Location: Left Arm, Cuff Size: Normal)   Pulse 68   Temp (!) 97.5 ?F (36.4 ?C) (Temporal)   Ht '5\' 3"'$  (1.6 m)   Wt 116 lb 6.4 oz (52.8 kg)   SpO2 97%   BMI 20.62 kg/m?  ? ?Past Medical History:  ?Allergies, Anti cardiolipin Ab, Anxiety, Depression, GERD, HSV, HLD, Hypothyroidism, Osteopenia, Rosacea, Vertigo, Vit D deficiency ? ?Past Surgical History:  ?She  has a past surgical history that includes Colonoscopy; Dilation and curettage of uterus (30 years ago); Cervical biopsy w/ loop electrode excision; Eye surgery; Colonoscopy with propofol (N/A, 03/30/2018); and polypectomy (N/A, 03/30/2018). ? ?Brief Summary:  ?Sandra Horton is a 71 y.o. female with obstructive sleep apnea. ?  ? ? ? ?Subjective:  ? ?She had influenza in December and saw [employer].  Cough has resolved.   ? ?She was seen by Dr. Melida Quitter with ENT.  She is being assessed for Inspire device and is to be scheduled for drug induced sleep endoscopy (DISE). ? ?Her main issue with CPAP set up has been mask fit and air leak.  She first tried a full face mask.  She didn't have trouble with air leak, but the mask caused a sore over the bridge of her nose.  She then tried a nasal pillow mask.  This caused air leak and she would breath through her mouth.  For some reason she was told by her DME that she wasn't allowed to use a chin strap.  She then got a hybrid mask.  Mouth leak got better, but still having leak around her nostrils going up into her eyes. ? ?She did feel like her sleep was better when she is able to use CPAP.  She also felt more alert during the day. ? ? ?Physical Exam:  ? ?Appearance - well kempt  ? ?ENMT - no sinus tenderness, no oral exudate, no LAN, Mallampati 3 airway, no stridor, wears dentures, over bite ? ?Respiratory -  equal breath sounds bilaterally, no wheezing or rales ? ?CV - s1s2 regular rate and rhythm, no murmurs ? ?Ext - no clubbing, no edema ? ?Skin - no rashes ? ?Psych - normal mood and affect ? ?  ?Pulmonary testing:  ?PFT 01/15/21 >> FEV1 2.28 (106%), FEV1% 83, TLC 4.90 (90%), DLCO 94% ? ?Sleep Tests:  ?HST 02/27/21 >> AHI 23.4, SpO2 low 81%. ?Auto CPAP 06/11/21 to 09/08/21 >> used on 49 of 90 nights with average 5 hrs 45 min.  Average AHI 2.8 with mean CPAP 8 and 95 th percentile CPAP 11 cm H2O ? ?Cardiac Tests:  ?Echo 11/10/20 >> EF 55 to 60%, grade 1 DD, mild MR ? ?Social History:  ?She  reports that she has never smoked. She has never used smokeless tobacco. She reports that she does not drink alcohol and does not use drugs. ? ?Family History:  ?Her family history includes Cancer in her mother; Colon cancer (age of onset: 56) in her maternal aunt; Diabetes in her daughter and mother; Heart attack (age of onset: 22) in her father; Heart disease in her father; Hypertension in her father; Ovarian cancer (age of onset: 9) in her sister; Stroke in her father; Thyroid disease in her mother and sister. ?  ? ? ?Discussion:  ?She has  trouble using CPAP.  This is related to mask fit.  Otherwise she feels that CPAP has helped her sleep.  She is being evaluated by ENT for an Inspire device and has DISE scheduled for later this month.  In the meantime, there might be some options to try that might make it so she can tolerate CPAP therapy. ? ?Assessment/Plan:  ? ?Obstructive sleep apnea. ?- she uses [employer] for her DME ?- continue auto CPAP 5 to 15 cm H2O ?- will see if she can get a SleepWeaver cloth full face mask with a chin strap ?- she has DISE scheduled with Dr. Redmond Baseman on 09/25/21; advised that she should proceed with this so that she can get Inspire device set up if adjustments to CPAP mask fit don't work ?- she is not a candidate for an oral appliance since she has dentures ? ?Time Spent Involved in Patient Care on Day of  Examination:  ?27 minutes ? ?Follow up:  ? ?Patient Instructions  ?Will have [employer] arrange for a SleepWeaver Cloth Full face mask and arrange for a chin strap ? ?Follow up in 4 months ? ?Medication List:  ? ?Allergies as of 09/20/2021   ? ?   Reactions  ? Penicillin V Potassium Anaphylaxis  ? Penicillins Anaphylaxis  ? Tetanus Toxoid Nausea And Vomiting  ? ?  ? ?  ?Medication List  ?  ? ?  ? Accurate as of September 20, 2021 11:36 AM. If you have any questions, ask your nurse or doctor.  ?  ?  ? ?  ? ?ANTIVERT PO ?Take by mouth as needed. ?  ?aspirin 81 MG tablet ?Take 81 mg by mouth daily. ?  ?Calcium 500 MG tablet ?Take 500 mg by mouth in the morning and at bedtime. ?  ?cholecalciferol 25 MCG (1000 UNIT) tablet ?Commonly known as: VITAMIN D3 ?Take 1,000 Units by mouth daily. ?  ?estradiol 0.1 MG/GM vaginal cream ?Commonly known as: ESTRACE ?Insert 1 g vaginally once weekly ?  ?levothyroxine 88 MCG tablet ?Commonly known as: SYNTHROID ?Take 1 tablet (88 mcg total) by mouth daily before breakfast. Empty stomach ?  ?magnesium chloride 64 MG Tbec SR tablet ?Commonly known as: SLOW-MAG ?Take 1 tablet by mouth. ?  ?MOTRIN PO ?Take by mouth. ?  ?MULTIVITAMIN ADULT PO ?  ?omeprazole 40 MG capsule ?Commonly known as: PRILOSEC ?Take 1 capsule (40 mg total) by mouth daily. Wait 3-4 hours after thyroid med. Take 30 minutes before food lunch/dinner ?  ?PARoxetine 20 MG tablet ?Commonly known as: PAXIL ?Take 1 tablet (20 mg total) by mouth daily. ?  ?polyethylene glycol powder 17 GM/SCOOP powder ?Commonly known as: GLYCOLAX/MIRALAX ?Take 17 g by mouth daily as needed. ?  ?rosuvastatin 40 MG tablet ?Commonly known as: CRESTOR ?Take 1 tablet (40 mg total) by mouth daily. ?  ?valACYclovir 500 MG tablet ?Commonly known as: VALTREX ?Take 1 tab daily as preventive or BID for 3 days prn sx flare ?  ?Vitamin C 500 MG Chew ?  ? ?  ? ? ?Signature:  ?Chesley Mires, MD ?[place] ?Pager - [phone] ?09/20/2021, 11:36  AM ?  ? ? ? ? ? ? ? ? ?

## 2021-09-20 NOTE — Patient Instructions (Signed)
Will have [employer] arrange for a SleepWeaver Cloth Full face mask and arrange for a chin strap ? ?Follow up in 4 months ?

## 2021-09-25 ENCOUNTER — Encounter (HOSPITAL_BASED_OUTPATIENT_CLINIC_OR_DEPARTMENT_OTHER)
Admission: RE | Disposition: A | Discharge status: Home / Self Care | Payer: Self-pay | Source: Ambulatory Visit | Attending: Otolaryngology

## 2021-09-25 ENCOUNTER — Ambulatory Visit (HOSPITAL_BASED_OUTPATIENT_CLINIC_OR_DEPARTMENT_OTHER): Payer: Medicare Other | Admitting: Certified Registered"

## 2021-09-25 ENCOUNTER — Ambulatory Visit (INDEPENDENT_AMBULATORY_CARE_PROVIDER_SITE_OTHER): Payer: Medicare Other | Admitting: Internal Medicine

## 2021-09-25 ENCOUNTER — Encounter (HOSPITAL_BASED_OUTPATIENT_CLINIC_OR_DEPARTMENT_OTHER): Payer: Self-pay | Admitting: Otolaryngology

## 2021-09-25 ENCOUNTER — Other Ambulatory Visit: Payer: Self-pay

## 2021-09-25 ENCOUNTER — Encounter: Payer: Self-pay | Admitting: Internal Medicine

## 2021-09-25 ENCOUNTER — Ambulatory Visit (HOSPITAL_BASED_OUTPATIENT_CLINIC_OR_DEPARTMENT_OTHER)
Admission: RE | Admit: 2021-09-25 | Discharge: 2021-09-25 | Disposition: A | Discharge status: Home / Self Care | Payer: Medicare Other | Source: Ambulatory Visit | Attending: Otolaryngology | Admitting: Otolaryngology

## 2021-09-25 VITALS — BP 110/68 | HR 61 | Temp 97.7°F | Resp 14 | Ht 63.0 in | Wt 118.4 lb

## 2021-09-25 DIAGNOSIS — N952 Postmenopausal atrophic vaginitis: Secondary | ICD-10-CM

## 2021-09-25 DIAGNOSIS — Z79899 Other long term (current) drug therapy: Secondary | ICD-10-CM | POA: Insufficient documentation

## 2021-09-25 DIAGNOSIS — E039 Hypothyroidism, unspecified: Secondary | ICD-10-CM

## 2021-09-25 DIAGNOSIS — G4733 Obstructive sleep apnea (adult) (pediatric): Secondary | ICD-10-CM | POA: Insufficient documentation

## 2021-09-25 DIAGNOSIS — Z9989 Dependence on other enabling machines and devices: Secondary | ICD-10-CM

## 2021-09-25 DIAGNOSIS — Z1231 Encounter for screening mammogram for malignant neoplasm of breast: Secondary | ICD-10-CM | POA: Diagnosis not present

## 2021-09-25 DIAGNOSIS — Z1389 Encounter for screening for other disorder: Secondary | ICD-10-CM | POA: Diagnosis not present

## 2021-09-25 DIAGNOSIS — K219 Gastro-esophageal reflux disease without esophagitis: Secondary | ICD-10-CM | POA: Insufficient documentation

## 2021-09-25 DIAGNOSIS — F32A Depression, unspecified: Secondary | ICD-10-CM | POA: Insufficient documentation

## 2021-09-25 DIAGNOSIS — F419 Anxiety disorder, unspecified: Secondary | ICD-10-CM | POA: Diagnosis not present

## 2021-09-25 DIAGNOSIS — N951 Menopausal and female climacteric states: Secondary | ICD-10-CM

## 2021-09-25 DIAGNOSIS — R5383 Other fatigue: Secondary | ICD-10-CM

## 2021-09-25 DIAGNOSIS — Z0000 Encounter for general adult medical examination without abnormal findings: Secondary | ICD-10-CM

## 2021-09-25 DIAGNOSIS — F418 Other specified anxiety disorders: Secondary | ICD-10-CM | POA: Diagnosis not present

## 2021-09-25 DIAGNOSIS — F3342 Major depressive disorder, recurrent, in full remission: Secondary | ICD-10-CM | POA: Diagnosis not present

## 2021-09-25 HISTORY — PX: DRUG INDUCED ENDOSCOPY: SHX6808

## 2021-09-25 LAB — COMPREHENSIVE METABOLIC PANEL
ALT: 14 U/L (ref 0–35)
AST: 19 U/L (ref 0–37)
Albumin: 4.4 g/dL (ref 3.5–5.2)
Alkaline Phosphatase: 60 U/L (ref 39–117)
BUN: 8 mg/dL (ref 6–23)
CO2: 29 mEq/L (ref 19–32)
Calcium: 9.5 mg/dL (ref 8.4–10.5)
Chloride: 105 mEq/L (ref 96–112)
Creatinine, Ser: 0.83 mg/dL (ref 0.40–1.20)
GFR: 70.58 mL/min (ref >60.00)
Glucose, Bld: 88 mg/dL (ref 70–99)
Potassium: 4 mEq/L (ref 3.5–5.1)
Sodium: 140 mEq/L (ref 135–145)
Total Bilirubin: 0.5 mg/dL (ref 0.2–1.2)
Total Protein: 7 g/dL (ref 6.0–8.3)

## 2021-09-25 LAB — LIPID PANEL
Cholesterol: 150 mg/dL (ref 0–200)
HDL: 75.6 mg/dL (ref >39.00)
LDL Cholesterol: 62 mg/dL (ref 0–99)
NonHDL: 74.3
Total CHOL/HDL Ratio: 2
Triglycerides: 63 mg/dL (ref 0.0–149.0)
VLDL: 12.6 mg/dL (ref 0.0–40.0)

## 2021-09-25 LAB — CBC WITH DIFFERENTIAL/PLATELET
Basophils Absolute: 0 10*3/uL (ref 0.0–0.1)
Basophils Relative: 0.7 % (ref 0.0–3.0)
Eosinophils Absolute: 0.1 10*3/uL (ref 0.0–0.7)
Eosinophils Relative: 2 % (ref 0.0–5.0)
HCT: 37.7 % (ref 36.0–46.0)
Hemoglobin: 12.3 g/dL (ref 12.0–15.0)
Lymphocytes Relative: 26.1 % (ref 12.0–46.0)
Lymphs Abs: 0.9 10*3/uL (ref 0.7–4.0)
MCHC: 32.6 g/dL (ref 30.0–36.0)
MCV: 92.4 fl (ref 78.0–100.0)
Monocytes Absolute: 0.3 10*3/uL (ref 0.1–1.0)
Monocytes Relative: 8 % (ref 3.0–12.0)
Neutro Abs: 2.3 10*3/uL (ref 1.4–7.7)
Neutrophils Relative %: 63.2 % (ref 43.0–77.0)
Platelets: 254 10*3/uL (ref 150.0–400.0)
RBC: 4.08 Mil/uL (ref 3.87–5.11)
RDW: 14.2 % (ref 11.5–15.5)
WBC: 3.6 10*3/uL — ABNORMAL LOW (ref 4.0–10.5)

## 2021-09-25 LAB — TSH: TSH: 1.25 u[IU]/mL (ref 0.35–5.50)

## 2021-09-25 SURGERY — DRUG INDUCED SLEEP ENDOSCOPY
Anesthesia: Monitor Anesthesia Care | Site: Nose

## 2021-09-25 MED ORDER — LEVOTHYROXINE SODIUM 88 MCG PO TABS: 88.0000 ug | ORAL_TABLET | Freq: Every day | ORAL | 3 refills | Status: DC

## 2021-09-25 MED ORDER — PROPOFOL 500 MG/50ML IV EMUL
INTRAVENOUS | Status: DC | PRN
  Administered 2021-09-25: 25 ug/kg/min via INTRAVENOUS

## 2021-09-25 MED ORDER — ACETAMINOPHEN 10 MG/ML IV SOLN: 1000.0000 mg | Freq: Once | INTRAVENOUS | Status: DC | PRN

## 2021-09-25 MED ORDER — OXYMETAZOLINE HCL 0.05 % NA SOLN
NASAL | Status: DC | PRN
  Administered 2021-09-25: 1 via TOPICAL

## 2021-09-25 MED ORDER — OMEPRAZOLE 40 MG PO CPDR: 40.0000 mg | DELAYED_RELEASE_CAPSULE | Freq: Every day | ORAL | 3 refills | Status: DC

## 2021-09-25 MED ORDER — POLYETHYLENE GLYCOL 3350 17 GM/SCOOP PO POWD: 17.0000 g | Freq: Every day | ORAL | 11 refills | Status: DC | PRN

## 2021-09-25 MED ORDER — ONDANSETRON HCL 4 MG/2ML IJ SOLN
INTRAMUSCULAR | Status: DC | PRN
  Administered 2021-09-25: 4 mg via INTRAVENOUS

## 2021-09-25 MED ORDER — AMISULPRIDE (ANTIEMETIC) 5 MG/2ML IV SOLN: 10.0000 mg | Freq: Once | INTRAVENOUS | Status: DC | PRN

## 2021-09-25 MED ORDER — ROSUVASTATIN CALCIUM 40 MG PO TABS: 40.0000 mg | ORAL_TABLET | Freq: Every day | ORAL | 3 refills | Status: DC

## 2021-09-25 MED ORDER — ONDANSETRON HCL 4 MG/2ML IJ SOLN: 4.0000 mg | Freq: Once | INTRAMUSCULAR | Status: DC | PRN

## 2021-09-25 MED ORDER — PAROXETINE HCL 20 MG PO TABS: 20.0000 mg | ORAL_TABLET | Freq: Every day | ORAL | 3 refills | Status: DC

## 2021-09-25 MED ORDER — LACTATED RINGERS IV SOLN: INTRAVENOUS | Status: DC

## 2021-09-25 MED ORDER — ESTRADIOL 0.1 MG/GM VA CREA: TOPICAL_CREAM | VAGINAL | 3 refills | Status: DC

## 2021-09-25 MED ORDER — LIDOCAINE 2% (20 MG/ML) 5 ML SYRINGE
INTRAMUSCULAR | Status: DC | PRN
  Administered 2021-09-25: 30 mg via INTRAVENOUS

## 2021-09-25 MED ORDER — FENTANYL CITRATE (PF) 100 MCG/2ML IJ SOLN: 25.0000 ug | INTRAMUSCULAR | Status: DC | PRN

## 2021-09-25 SURGICAL SUPPLY — 12 items
CANISTER SUCT 1200ML W/VALVE (MISCELLANEOUS) ×1 IMPLANT
GLOVE BIO SURGEON STRL SZ7.5 (GLOVE) ×2 IMPLANT
KIT CLEAN ENDO (MISCELLANEOUS) ×2 IMPLANT
NDL HYPO 27GX1-1/4 (NEEDLE) IMPLANT
NEEDLE HYPO 27GX1-1/4 (NEEDLE) IMPLANT
PATTIES SURGICAL .5 X3 (DISPOSABLE) ×2 IMPLANT
SHEET MEDIUM DRAPE 40X70 STRL (DRAPES) ×1 IMPLANT
SOL ANTI FOG 6CC (MISCELLANEOUS) ×1 IMPLANT
SOLUTION ANTI FOG 6CC (MISCELLANEOUS) ×1
SYR CONTROL 10ML LL (SYRINGE) IMPLANT
TOWEL GREEN STERILE FF (TOWEL DISPOSABLE) ×2 IMPLANT
TUBE CONNECTING 20X1/4 (TUBING) ×1 IMPLANT

## 2021-09-25 NOTE — Anesthesia Procedure Notes (Signed)
Procedure Name: [place] ?Date/Time: 09/25/2021 1:39 PM ?Performed by: Signe Colt, CRNA ?Pre-anesthesia Checklist: Patient identified, Emergency Drugs available, Suction available, Patient being monitored and Timeout performed ?Patient Re-evaluated:Patient Re-evaluated prior to induction ?Oxygen Delivery Method: Simple face mask ? ? ? ? ?

## 2021-09-25 NOTE — Op Note (Signed)
Preop diagnosis: Obstructive sleep apnea Postop diagnosis: same Procedure: Drug-induced sleep endoscopy Surgeon: Gerrod Maule Anesth: IV sedation Compl: None Findings: There is 100% anterior-posterior collapse at the velum making her a candidate for hypoglossal nerve stimulator placement.  There was also anterior-posterior collapse at the tongue base. Description:  After discussing risks, benefits, and alternatives, the patient was brought to the operative suite and placed on the operative table in the supine position.  Anesthesia was induced and the patient was given light sedation to simulate natural sleep. When the proper level was reached, an Afrin-soaked pledget was placed in the right nasal passage for a couple of minutes and then removed.  The fiberoptic laryngoscope was then passed to view the pharynx and larynx.  Findings are noted above and the exam was recorded.  After completion, the scope was removed and the patient was returned to anesthesia for wakeup and was moved to the recovery room in stable condition.  

## 2021-09-25 NOTE — Progress Notes (Signed)
Chief Complaint  ?Patient presents with  ? Follow-up  ?  Yearly f/u, denies any concerns or pain. Feeling good overall, fasting this morning.  ? ?Yearly doing well  ?1. Hypothyroidism on levo 88 mcg qd  ?No complaints  ? ? ?Review of Systems  ?Constitutional:  Negative for weight loss.  ?HENT:  Negative for hearing loss.   ?Eyes:  Negative for blurred vision.  ?Respiratory:  Negative for shortness of breath.   ?Cardiovascular:  Negative for chest pain.  ?Gastrointestinal:  Negative for abdominal pain and blood in stool.  ?Genitourinary:  Negative for dysuria.  ?Musculoskeletal:  Negative for falls and joint pain.  ?Skin:  Negative for rash.  ?Neurological:  Negative for headaches.  ?Psychiatric/Behavioral:  Negative for depression.   ?Past Medical History:  ?Diagnosis Date  ? Allergy   ? Anti-cardiolipin antibody positive   ? Dr. Jefm Bryant   ? Anxiety   ? Arthritis   ? BRCA negative 2016  ? MyRisk neg   ? Depression   ? Family history of ovarian cancer 2016  ? MyRisk neg; affected sister is BRCA neg  ? GERD (gastroesophageal reflux disease)   ? Herpes simplex   ? Hyperlipidemia   ? Hypothyroidism   ? Osteopenia   ? DEXA 09/02/13   ? Rosacea   ? Ruptured disc, cervical   ? Sleep apnea   ? Vertigo   ? per pt cervicogenic Physical therapy helped   ? Vitamin D deficiency   ? Wears dentures   ? full upper  ? ?Past Surgical History:  ?Procedure Laterality Date  ? CERVICAL BIOPSY  W/ LOOP ELECTRODE EXCISION    ? COLONOSCOPY    ? COLONOSCOPY WITH PROPOFOL N/A 03/30/2018  ? Procedure: COLONOSCOPY WITH Biopsies;  Surgeon: [name], MD;  Location: [facility];  Service: Endoscopy;  Laterality: N/A;  ? DILATION AND CURETTAGE OF UTERUS  30 years ago  ? EYE SURGERY    ? b/l cataract   ? POLYPECTOMY N/A 03/30/2018  ? Procedure: POLYPECTOMY;  Surgeon: [name], MD;  Location: [facility];  Service: Endoscopy;  Laterality: N/A;  ? ?Family History  ?Problem Relation Age of Onset  ? Cancer Mother   ?     kidney  met to lungs   ? Diabetes Mother   ? Thyroid disease Mother   ? Heart disease Father   ? Hypertension Father   ? Stroke Father   ? Heart attack Father 52  ? Thyroid disease Sister   ? Ovarian cancer Sister 96  ?     BRCA neg  ? Colon cancer Maternal Aunt 56  ? Diabetes Daughter   ?     type 1   ? Breast cancer Neg Hx   ? ?Social History  ? ?Socioeconomic History  ? Marital status: Married  ?  Spouse name: Not on file  ? Number of children: Not on file  ? Years of education: Not on file  ? Highest education level: Not on file  ?Occupational History  ? Not on file  ?Tobacco Use  ? Smoking status: Never  ? Smokeless tobacco: Never  ?Vaping Use  ? Vaping Use: Never used  ?Substance and Sexual Activity  ? Alcohol use: No  ?  Alcohol/week: 0.0 standard drinks  ? Drug use: No  ? Sexual activity: Yes  ?  Birth control/protection: Post-menopausal  ?Other Topics Concern  ? Not on file  ?Social History Narrative  ?  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 ?Social Determinants of Health  ? ?Financial Resource Strain: Low Risk   ? Difficulty of Paying Living Expenses: Not hard at all  ?Food Insecurity: No Food Insecurity  ? Worried About Charity fundraiser in the Last Year: Never true  ? Ran Out of Food in the Last Year: Never true  ?Transportation Needs: No Transportation Needs  ? Lack of Transportation (Medical): No  ? Lack of Transportation (Non-Medical): No  ?Physical Activity: Sufficiently Active  ? Days of Exercise per Week: 5 days  ? Minutes of Exercise per Session: 30 min  ?Stress: No Stress Concern Present  ? Feeling of Stress : Not at all  ?Social Connections: Unknown  ? Frequency of Communication with Friends and Family: More than three times a week  ? Frequency of Social Gatherings with Friends and Family: More than three times a week  ? Attends Religious Services: Not on file  ? Active Member of Clubs or Organizations: Not on file  ? Attends Archivist Meetings: Not on file  ? Marital Status: Not on  file  ?Intimate Partner Violence: Not At Risk  ? Fear of Current or Ex-Partner: No  ? Emotionally Abused: No  ? Physically Abused: No  ? Sexually Abused: No  ? ?Current Meds  ?Medication Sig  ? Ascorbic Acid (VITAMIN C) 500 MG CHEW   ? aspirin 81 MG tablet Take 81 mg by mouth daily.  ? Calcium 500 MG tablet Take 500 mg by mouth in the morning and at bedtime.  ? cholecalciferol (VITAMIN D3) 25 MCG (1000 UNIT) tablet Take 1,000 Units by mouth daily.  ? Ibuprofen (MOTRIN PO) Take by mouth.  ? magnesium chloride (SLOW-MAG) 64 MG TBEC SR tablet Take 1 tablet by mouth.  ? Meclizine HCl (ANTIVERT PO) Take by mouth as needed.  ? Multiple Vitamins-Minerals (MULTIVITAMIN ADULT PO)   ? valACYclovir (VALTREX) 500 MG tablet Take 1 tab daily as preventive or BID for 3 days prn sx flare  ? [DISCONTINUED] estradiol (ESTRACE) 0.1 MG/GM vaginal cream Insert 1 g vaginally once weekly  ? [DISCONTINUED] levothyroxine (SYNTHROID) 88 MCG tablet Take 1 tablet (88 mcg total) by mouth daily before breakfast. Empty stomach  ? [DISCONTINUED] omeprazole (PRILOSEC) 40 MG capsule Take 1 capsule (40 mg total) by mouth daily. Wait 3-4 hours after thyroid med. Take 30 minutes before food lunch/dinner  ? [DISCONTINUED] PARoxetine (PAXIL) 20 MG tablet Take 1 tablet (20 mg total) by mouth daily.  ? [DISCONTINUED] polyethylene glycol powder (GLYCOLAX/MIRALAX) powder Take 17 g by mouth daily as needed.  ? ?Allergies  ?Allergen Reactions  ? Penicillin V Potassium Anaphylaxis  ? Penicillins Anaphylaxis  ? Tetanus Toxoid Nausea And Vomiting  ? ?No results found for this or any previous visit (from the past 2160 hour(s)). ?Objective  ?Body mass index is 20.97 kg/m?. ?Wt Readings from Last 3 Encounters:  ?09/25/21 118 lb 6.4 oz (53.7 kg)  ?09/20/21 116 lb 6.4 oz (52.8 kg)  ?09/19/21 120 lb (54.4 kg)  ? ?Temp Readings from Last 3 Encounters:  ?09/25/21 97.7 ?F (36.5 ?C) (Oral)  ?09/20/21 (!) 97.5 ?F (36.4 ?C) (Temporal)  ?05/31/21 (!) 97 ?F (36.1 ?C) (Oral)   ? ?BP Readings from Last 3 Encounters:  ?09/25/21 110/68  ?09/20/21 110/70  ?06/25/21 90/60  ? ?Pulse Readings from Last 3 Encounters:  ?09/25/21 61  ?09/20/21 68  ?05/31/21 76  ? ? ?Physical Exam ?Vitals and nursing note reviewed.  ?Constitutional:   ?  Appearance: Normal appearance. She is well-developed and well-groomed.  ?HENT:  ?   Head: Normocephalic and atraumatic.  ?Eyes:  ?   Conjunctiva/sclera: Conjunctivae normal.  ?   Pupils: Pupils are equal, round, and reactive to light.  ?Cardiovascular:  ?   Rate and Rhythm: Normal rate and regular rhythm.  ?   Heart sounds: Normal heart sounds. No murmur heard. ?Pulmonary:  ?   Effort: Pulmonary effort is normal.  ?   Breath sounds: Normal breath sounds.  ?Abdominal:  ?   General: Abdomen is flat. Bowel sounds are normal.  ?   Tenderness: There is no abdominal tenderness.  ?Musculoskeletal:     ?   General: No tenderness.  ?Skin: ?   General: Skin is warm and dry.  ?Neurological:  ?   General: No focal deficit present.  ?   Mental Status: She is alert and oriented to person, place, and time. Mental status is at baseline.  ?   Cranial Nerves: Cranial nerves 2-12 are intact.  ?   Motor: Motor function is intact.  ?   Coordination: Coordination is intact.  ?   Gait: Gait is intact.  ?Psychiatric:     ?   Attention and Perception: Attention and perception normal.     ?   Mood and Affect: Mood and affect normal.     ?   Speech: Speech normal.     ?   Behavior: Behavior normal. Behavior is cooperative.     ?   Thought Content: Thought content normal.     ?   Cognition and Memory: Cognition and memory normal.     ?   Judgment: Judgment normal.  ? ? ?Assessment  ?Plan  ?Hypothyroidism, unspecified type - Plan: TSH, levothyroxine (SYNTHROID) 88 MCG tablet ? ?OSA on CPAP ?Pending sleep endoscopy Dr. Redmond Baseman today considering inspire ? ?Vaginal dryness, menopausal - Plan: estradiol (ESTRACE) 0.1 MG/GM vaginal cream ? ?Postmenopausal atrophic vaginitis - Sx improved wtih  estrace crm. Rx RF. Coupon card.  - Plan: estradiol (ESTRACE) 0.1 MG/GM vaginal cream ? ?Gastroesophageal reflux disease - Plan: omeprazole (PRILOSEC) 40 MG capsule ? ?Depression, major, recurrent, in complete

## 2021-09-25 NOTE — Brief Op Note (Signed)
09/25/2021 ? ?1:41 PM ? ?PATIENT:  Sandra Horton  71 y.o. female ? ?PRE-OPERATIVE DIAGNOSIS:  Obstructive Sleep Apnea ? ?POST-OPERATIVE DIAGNOSIS:  Obstructive Sleep Apnea ? ?PROCEDURE:  Procedure(s): ?DRUG INDUCED ENDOSCOPY (N/A) ? ?SURGEON:  Surgeon(s) and Role: ?   Melida Quitter, MD - Primary ? ?PHYSICIAN ASSISTANT:  ? ?ASSISTANTS: none  ? ?ANESTHESIA:   IV sedation ? ?EBL:  None  ? ?BLOOD ADMINISTERED:none ? ?DRAINS: none  ? ?LOCAL MEDICATIONS USED:  NONE ? ?SPECIMEN:  No Specimen ? ?DISPOSITION OF SPECIMEN:  N/A ? ?COUNTS:  YES ? ?TOURNIQUET:  * No tourniquets in log * ? ?DICTATION: .Note written in EPIC ? ?PLAN OF CARE: Discharge to home after PACU ? ?PATIENT DISPOSITION:  PACU - hemodynamically stable. ?  ?Delay start of Pharmacological VTE agent (>24hrs) due to surgical blood loss or risk of bleeding: no ? ?

## 2021-09-25 NOTE — Discharge Instructions (Signed)

## 2021-09-25 NOTE — Anesthesia Preprocedure Evaluation (Addendum)
Anesthesia Evaluation  ?Patient identified by MRN, date of birth, ID band ?Patient awake ? ? ? ?Reviewed: ?Allergy & Precautions, NPO status , Patient's Chart, lab work & pertinent test results ? ?Airway ?Mallampati: II ? ?TM Distance: >3 FB ?Neck ROM: Full ? ? ? Dental ? ?(+) Upper Dentures ?  ?Pulmonary ?sleep apnea and Continuous Positive Airway Pressure Ventilation ,  ?  ?Pulmonary exam normal ? ? ? ? ? ? ? Cardiovascular ?negative cardio ROS ?Normal cardiovascular exam ? ? ?  ?Neuro/Psych ?PSYCHIATRIC DISORDERS Anxiety Depression  Neuromuscular disease   ? GI/Hepatic ?Neg liver ROS, GERD  Medicated and Controlled,  ?Endo/Other  ?Hypothyroidism  ? Renal/GU ?negative Renal ROS  ? ?  ?Musculoskeletal ? ?(+) Arthritis ,  ? Abdominal ?  ?Peds ? Hematology ?negative hematology ROS ?(+)   ?Anesthesia Other Findings ?Obstructive Sleep Apnea ? Reproductive/Obstetrics ? ?  ? ? ? ? ? ? ? ? ? ? ? ? ? ?  ?  ? ? ? ? ? ? ? ?Anesthesia Physical ?Anesthesia Plan ? ?ASA: 2 ? ?Anesthesia Plan: MAC  ? ?Post-op Pain Management:   ? ?Induction: Intravenous ? ?PONV Risk Score and Plan: 2 and Propofol infusion and Treatment may vary due to age or medical condition ? ?Airway Management Planned: Nasal Cannula ? ?Additional Equipment:  ? ?Intra-op Plan:  ? ?Post-operative Plan:  ? ?Informed Consent: I have reviewed the patients History and Physical, chart, labs and discussed the procedure including the risks, benefits and alternatives for the proposed anesthesia with the patient or authorized representative who has indicated his/her understanding and acceptance.  ? ? ? ?Dental advisory given ? ?Plan Discussed with: CRNA ? ?Anesthesia Plan Comments:   ? ? ? ? ? ? ?Anesthesia Quick Evaluation ? ?

## 2021-09-25 NOTE — Anesthesia Postprocedure Evaluation (Signed)
Anesthesia Post Note ? ?Patient: Champayne Kocian Joy ? ?Procedure(s) Performed: DRUG INDUCED ENDOSCOPY (Nose) ? ?  ? ?Patient location during evaluation: PACU ?Anesthesia Type: MAC ?Level of consciousness: awake ?Pain management: pain level controlled ?Vital Signs Assessment: post-procedure vital signs reviewed and stable ?Respiratory status: spontaneous breathing, nonlabored ventilation, respiratory function stable and patient connected to nasal cannula oxygen ?Cardiovascular status: stable and blood pressure returned to baseline ?Postop Assessment: no apparent nausea or vomiting ?Anesthetic complications: no ? ? ?No notable events documented. ? ?Last Vitals:  ?Vitals:  ? 09/25/21 1355 09/25/21 1405  ?BP: 112/65 (!) 114/55  ?Pulse: 63 75  ?Resp: 14 16  ?Temp:  36.6 ?C  ?SpO2: 99% 97%  ?  ?Last Pain:  ?Vitals:  ? 09/25/21 1405  ?TempSrc:   ?PainSc: 0-No pain  ? ? ?  ?  ?  ?  ?  ?  ? ?Koby Pickup P Hazel Leveille ? ? ? ? ?

## 2021-09-25 NOTE — Patient Instructions (Signed)
F/u in 1 year  ?Mammogram due 08/31/22  ?Colonoscopy 03/2024 Dr. Allen Norris  ?

## 2021-09-25 NOTE — H&P (Signed)
Sandra Horton is an 71 y.o. female.   ?Chief Complaint: Sleep apnea ?HPI: 71 year old female with obstructive sleep apnea who has been unable to tolerate CPAP. ? ?Past Medical History:  ?Diagnosis Date  ? Allergy   ? Anti-cardiolipin antibody positive   ? Dr. Jefm Bryant   ? Anxiety   ? Arthritis   ? BRCA negative 2016  ? MyRisk neg   ? Depression   ? Family history of ovarian cancer 2016  ? MyRisk neg; affected sister is BRCA neg  ? GERD (gastroesophageal reflux disease)   ? Herpes simplex   ? Hyperlipidemia   ? Hypothyroidism   ? Osteopenia   ? DEXA 09/02/13   ? Rosacea   ? Ruptured disc, cervical   ? Sleep apnea   ? Vertigo   ? per pt cervicogenic Physical therapy helped   ? Vitamin D deficiency   ? Wears dentures   ? full upper  ? ? ?Past Surgical History:  ?Procedure Laterality Date  ? CERVICAL BIOPSY  W/ LOOP ELECTRODE EXCISION    ? COLONOSCOPY    ? COLONOSCOPY WITH PROPOFOL N/A 03/30/2018  ? Procedure: COLONOSCOPY WITH Biopsies;  Surgeon: [name], MD;  Location: [facility];  Service: Endoscopy;  Laterality: N/A;  ? DILATION AND CURETTAGE OF UTERUS  30 years ago  ? EYE SURGERY    ? b/l cataract   ? POLYPECTOMY N/A 03/30/2018  ? Procedure: POLYPECTOMY;  Surgeon: [name], MD;  Location: [place];  Service: Endoscopy;  Laterality: N/A;  ? ? ?Family History  ?Problem Relation Age of Onset  ? Cancer Mother   ?     kidney met to lungs   ? Diabetes Mother   ? Thyroid disease Mother   ? Heart disease Father   ? Hypertension Father   ? Stroke Father   ? Heart attack Father 25  ? Thyroid disease Sister   ? Ovarian cancer Sister 90  ?     BRCA neg  ? Colon cancer Maternal Aunt 6  ? Diabetes Daughter   ?     type 1   ? Breast cancer Neg Hx   ? ?Social History:  reports that she has never smoked. She has never used smokeless tobacco. She reports that she does not drink alcohol and does not use drugs. ? ?Allergies:  ?Allergies  ?Allergen Reactions  ? Penicillin V Potassium Anaphylaxis  ?  Penicillins Anaphylaxis  ? Tetanus Toxoid Nausea And Vomiting  ? ? ?Medications Prior to Admission  ?Medication Sig Dispense Refill  ? Ascorbic Acid (VITAMIN C) 500 MG CHEW     ? aspirin 81 MG tablet Take 81 mg by mouth daily.    ? Calcium 500 MG tablet Take 500 mg by mouth in the morning and at bedtime.    ? cholecalciferol (VITAMIN D3) 25 MCG (1000 UNIT) tablet Take 1,000 Units by mouth daily.    ? Ibuprofen (MOTRIN PO) Take by mouth.    ? levothyroxine (SYNTHROID) 88 MCG tablet Take 1 tablet (88 mcg total) by mouth daily before breakfast. Empty stomach 90 tablet 3  ? magnesium chloride (SLOW-MAG) 64 MG TBEC SR tablet Take 1 tablet by mouth.    ? Meclizine HCl (ANTIVERT PO) Take by mouth as needed.    ? Multiple Vitamins-Minerals (MULTIVITAMIN ADULT PO)     ? omeprazole (PRILOSEC) 40 MG capsule Take 1 capsule (40 mg total) by mouth daily. Wait 3-4 hours after thyroid med. Take 30  minutes before food lunch/dinner 90 capsule 3  ? PARoxetine (PAXIL) 20 MG tablet Take 1 tablet (20 mg total) by mouth daily. 90 tablet 3  ? polyethylene glycol powder (GLYCOLAX/MIRALAX) 17 GM/SCOOP powder Take 17 g by mouth daily as needed. 850 g 11  ? rosuvastatin (CRESTOR) 40 MG tablet Take 1 tablet (40 mg total) by mouth daily. 90 tablet 3  ? valACYclovir (VALTREX) 500 MG tablet Take 1 tab daily as preventive or BID for 3 days prn sx flare 90 tablet 3  ? estradiol (ESTRACE) 0.1 MG/GM vaginal cream Insert 1 g vaginally once weekly 42.5 g 3  ? ? ?No results found for this or any previous visit (from the past 48 hour(s)). ?No results found. ? ?Review of Systems  ?All other systems reviewed and are negative. ? ?Blood pressure 127/62, pulse (!) 58, temperature 97.7 ?F (36.5 ?C), temperature source Oral, resp. rate 16, height $RemoveBe'5\' 3"'YkhOKteVD$  (1.6 m), weight 53.6 kg, SpO2 100 %. ?Physical Exam ?Constitutional:   ?   Appearance: Normal appearance. She is normal weight.  ?HENT:  ?   Head: Normocephalic and atraumatic.  ?   Right Ear: External ear normal.   ?   Left Ear: External ear normal.  ?   Nose: Nose normal.  ?   Mouth/Throat:  ?   Mouth: Mucous membranes are moist.  ?   Pharynx: Oropharynx is clear.  ?Eyes:  ?   Extraocular Movements: Extraocular movements intact.  ?   Conjunctiva/sclera: Conjunctivae normal.  ?   Pupils: Pupils are equal, round, and reactive to light.  ?Cardiovascular:  ?   Rate and Rhythm: Normal rate.  ?Pulmonary:  ?   Effort: Pulmonary effort is normal.  ?Musculoskeletal:  ?   Cervical back: Normal range of motion.  ?Skin: ?   General: Skin is warm and dry.  ?Neurological:  ?   General: No focal deficit present.  ?   Mental Status: She is alert and oriented to person, place, and time.  ?Psychiatric:     ?   Mood and Affect: Mood normal.     ?   Behavior: Behavior normal.     ?   Thought Content: Thought content normal.     ?   Judgment: Judgment normal.  ?  ? ?Assessment/Plan ?Obstructive sleep apnea and BMI 20.93 ? ?To OR for sleep endoscopy. ? ?Melida Quitter, MD ?09/25/2021, 12:53 PM ? ? ? ?

## 2021-09-25 NOTE — Transfer of Care (Signed)
Immediate Anesthesia Transfer of Care Note ? ?Patient: Sandra Horton ? ?Procedure(s) Performed: DRUG INDUCED ENDOSCOPY (Nose) ? ?Patient Location: PACU ? ?Anesthesia Type:MAC ? ?Level of Consciousness: awake, alert , oriented and patient cooperative ? ?Airway & Oxygen Therapy: Patient Spontanous Breathing and Patient connected to face mask oxygen ? ?Post-op Assessment: Report given to RN and Post -op Vital signs reviewed and stable ? ?Post vital signs: Reviewed and stable ? ?Last Vitals:  ?Vitals Value Taken Time  ?BP 111/63 09/25/21 1343  ?Temp    ?Pulse 61 09/25/21 1344  ?Resp 18 09/25/21 1344  ?SpO2 100 % 09/25/21 1344  ?Vitals shown include unvalidated device data. ? ?Last Pain:  ?Vitals:  ? 09/25/21 1104  ?TempSrc: Oral  ?PainSc: 0-No pain  ?   ? ?Patients Stated Pain Goal: 4 (09/25/21 1104) ? ?Complications: No notable events documented. ?

## 2021-09-26 ENCOUNTER — Other Ambulatory Visit: Payer: Self-pay

## 2021-09-26 ENCOUNTER — Encounter (HOSPITAL_BASED_OUTPATIENT_CLINIC_OR_DEPARTMENT_OTHER): Payer: Self-pay | Admitting: Otolaryngology

## 2021-09-26 DIAGNOSIS — R5383 Other fatigue: Secondary | ICD-10-CM

## 2021-09-26 LAB — URINALYSIS, ROUTINE W REFLEX MICROSCOPIC
Bacteria, UA: NONE SEEN /HPF
Bilirubin Urine: NEGATIVE
Glucose, UA: NEGATIVE
Hgb urine dipstick: NEGATIVE
Hyaline Cast: NONE SEEN /LPF
Ketones, ur: NEGATIVE
Nitrite: NEGATIVE
Protein, ur: NEGATIVE
RBC / HPF: NONE SEEN /HPF (ref 0–2)
Specific Gravity, Urine: 1.008 (ref 1.001–1.035)
pH: 6.5 (ref 5.0–8.0)

## 2021-09-26 LAB — MICROSCOPIC MESSAGE

## 2021-09-26 NOTE — Progress Notes (Signed)
Ordered repeat cbc w/diff for recheck in 1 month. Pt will return call to schedule lab appt.  ?

## 2021-10-01 NOTE — Progress Notes (Signed)
?Cardiology Office Note:   ? ?Date:  10/02/2021  ? ?ID:  Marche Hottenstein Palla, DOB [birth year], MRN [id number] ? ?PCP:  McLean-Scocuzza, Nino Glow, MD  ?[facility] Cardiologist:  None  ?[facility] Electrophysiologist:  None  ? ?Referring MD: McLean-Scocuzza, Olivia Mackie *  ? ?Chief Complaint: 6 month follow-up ? ?History of Present Illness:   ? ?Sandra Horton is a 71 y.o. female with a hx of with a hx of  h/o atherosclerosis at request of Dr. Terese Door, HLD, depression, anxiety, OSA on CPAP, aortic atherosclerosis on CT who is being seen for follow-up.  ?  ?She saw Dr. Saunders Revel 10/11/20 for SOB and chest pain for the last 1.5 years. Myoview and echo were ordered. Myoview showed TWI inversion during stress, overall normal, low risk, no evidence of ischemia. Echo showed LVEF 55-60%, G1DD, normal RV function and PA pressure, mild MR, mild late prolapse of posterior leaflet of MV, mod TR, mild thicking of aortic valve, trivial AI, mild sclerosis aortic valve.   ?  ?Seen 11/16/20 via televisit for review of myoview and echo, ordered for sob and cest pain.  Myoview was low risk with no ischemia. Echo showed preserved EF, G1DD, and mild valvular disease. She did not want to undergo invasive procedures wince symptoms were not limiting her function.  ? ?Last seen 02/28/21 and was doing about the same with CP and SOB, felt to be atypical. Observation of symptoms was recommended.  ? ?Today, the patient reports she has OSA using a CPAP. She has a sleep endoscopy last week to figure out which mask to use. She was having SOB and all the tests were normal. Diagnosed with moderate to severe OSA. She feels like breathing is better. Patient is very active, chasing grandkid daily. She occasional left sided sharp shooting chest pain. Only lasts a couple seconds. No LLE, orthopnea, pnd. Diet is good.  ? ? ?Past Medical History:  ?Diagnosis Date  ? Allergy   ? Anti-cardiolipin antibody positive   ? Dr. Jefm Bryant   ? Anxiety   ? Arthritis   ? BRCA  negative 2016  ? MyRisk neg   ? Depression   ? Family history of ovarian cancer 2016  ? MyRisk neg; affected sister is BRCA neg  ? GERD (gastroesophageal reflux disease)   ? Herpes simplex   ? Hyperlipidemia   ? Hypothyroidism   ? Osteopenia   ? DEXA 09/02/13   ? Rosacea   ? Ruptured disc, cervical   ? Sleep apnea   ? Sleep apnea   ? Vertigo   ? per pt cervicogenic Physical therapy helped   ? Vitamin D deficiency   ? Wears dentures   ? full upper  ? ? ?Past Surgical History:  ?Procedure Laterality Date  ? CERVICAL BIOPSY  W/ LOOP ELECTRODE EXCISION    ? COLONOSCOPY    ? COLONOSCOPY WITH PROPOFOL N/A 03/30/2018  ? Procedure: COLONOSCOPY WITH Biopsies;  Surgeon: [name], MD;  Location: [facility];  Service: Endoscopy;  Laterality: N/A;  ? DILATION AND CURETTAGE OF UTERUS  30 years ago  ? DRUG INDUCED ENDOSCOPY N/A 09/25/2021  ? Procedure: DRUG INDUCED ENDOSCOPY;  Surgeon: Melida Quitter, MD;  Location: [place];  Service: ENT;  Laterality: N/A;  ? EYE SURGERY    ? b/l cataract   ? POLYPECTOMY N/A 03/30/2018  ? Procedure: POLYPECTOMY;  Surgeon: [name], MD;  Location: [facility];  Service: Endoscopy;  Laterality: N/A;  ? ? ?Current  Medications: ?Current Meds  ?Medication Sig  ? Ascorbic Acid (VITAMIN C) 500 MG CHEW Chew 1 tablet by mouth daily.  ? aspirin 81 MG tablet Take 81 mg by mouth daily.  ? Calcium 500 MG tablet Take 500 mg by mouth in the morning and at bedtime.  ? cholecalciferol (VITAMIN D3) 25 MCG (1000 UNIT) tablet Take 1,000 Units by mouth daily.  ? estradiol (ESTRACE) 0.1 MG/GM vaginal cream Insert 1 g vaginally once weekly  ? Ibuprofen (MOTRIN PO) Take by mouth as needed.  ? levothyroxine (SYNTHROID) 88 MCG tablet Take 1 tablet (88 mcg total) by mouth daily before breakfast. Empty stomach  ? magnesium chloride (SLOW-MAG) 64 MG TBEC SR tablet Take 1 tablet by mouth daily.  ? Meclizine HCl (ANTIVERT PO) Take by mouth as needed.  ? Multiple Vitamins-Minerals  (MULTIVITAMIN ADULT PO) Take 1 tablet by mouth daily.  ? omeprazole (PRILOSEC) 40 MG capsule Take 1 capsule (40 mg total) by mouth daily. Wait 3-4 hours after thyroid med. Take 30 minutes before food lunch/dinner  ? PARoxetine (PAXIL) 20 MG tablet Take 1 tablet (20 mg total) by mouth daily.  ? polyethylene glycol powder (GLYCOLAX/MIRALAX) 17 GM/SCOOP powder Take 17 g by mouth daily as needed.  ? rosuvastatin (CRESTOR) 40 MG tablet Take 1 tablet (40 mg total) by mouth daily.  ? valACYclovir (VALTREX) 500 MG tablet Take 1 tab daily as preventive or BID for 3 days prn sx flare  ?  ? ?Allergies:   Penicillin v potassium, Penicillins, and Tetanus toxoid  ? ?Social History  ? ?Socioeconomic History  ? Marital status: Married  ?  Spouse name: Not on file  ? Number of children: Not on file  ? Years of education: Not on file  ? Highest education level: Not on file  ?Occupational History  ? Not on file  ?Tobacco Use  ? Smoking status: Never  ? Smokeless tobacco: Never  ?Vaping Use  ? Vaping Use: Never used  ?Substance and Sexual Activity  ? Alcohol use: No  ?  Alcohol/week: 0.0 standard drinks  ? Drug use: No  ? Sexual activity: Yes  ?  Birth control/protection: Post-menopausal  ?Other Topics Concern  ? Not on file  ?Social History Narrative  ?  [employer] [facility] [employer] retired as of 04/02/18   ? ?Social Determinants of Health  ? ?Financial Resource Strain: Low Risk   ? Difficulty of Paying Living Expenses: Not hard at all  ?Food Insecurity: No Food Insecurity  ? Worried About Charity fundraiser in the Last Year: Never true  ? Ran Out of Food in the Last Year: Never true  ?Transportation Needs: No Transportation Needs  ? Lack of Transportation (Medical): No  ? Lack of Transportation (Non-Medical): No  ?Physical Activity: Sufficiently Active  ? Days of Exercise per Week: 5 days  ? Minutes of Exercise per Session: 30 min  ?Stress: No Stress Concern Present  ? Feeling of Stress : Not at all  ?Social Connections: Unknown  ? Frequency  of Communication with Friends and Family: More than three times a week  ? Frequency of Social Gatherings with Friends and Family: More than three times a week  ? Attends Religious Services: Not on file  ? Active Member of Clubs or Organizations: Not on file  ? Attends Archivist Meetings: Not on file  ? Marital Status: Not on file  ?  ? ?Family History: ?The patient's family history includes Cancer in her mother; Colon cancer (age  of onset: 33) in her maternal aunt; Diabetes in her daughter and mother; Heart attack (age of onset: 63) in her father; Heart disease in her father; Hypertension in her father; Ovarian cancer (age of onset: 3) in her sister; Stroke in her father; Thyroid disease in her mother and sister. There is no history of Breast cancer. ? ?ROS:   ?Please see the history of present illness.    ? All other systems reviewed and are negative. ? ?EKGs/Labs/Other Studies Reviewed:   ? ?The following studies were reviewed today: ? ?Echo 11/10/20 ?1. Left ventricular ejection fraction, by estimation, is 55 to 60%. The  ?left ventricle has normal function. The left ventricle has no regional  ?wall motion abnormalities. Left ventricular diastolic parameters are  ?consistent with Grade I diastolic  ?dysfunction (impaired relaxation).  ? 2. Right ventricular systolic function is normal. The right ventricular  ?size is normal. There is normal pulmonary artery systolic pressure.  ? 3. The mitral valve is abnormal. Mild mitral valve regurgitation. No  ?evidence of mitral stenosis. There is mild late systolic prolapse of  ?posterior leaflet of the mitral valve.  ? 4. Tricuspid valve regurgitation is moderate.  ? 5. The aortic valve is tricuspid. There is mild thickening of the aortic  ?valve. Aortic valve regurgitation is trivial. Mild aortic valve sclerosis  ?is present, with no evidence of aortic valve stenosis.  ? 6. The inferior vena cava is normal in size with greater than 50%  ?respiratory  variability, suggesting right atrial pressure of 3 mmHg.  ?  ?Myoview 10/18/20 ?Narrative & Impression  ?T wave inversion was noted during stress in the III, aVF, V5, V6 and II leads. ?There was no ST segment deviation noted

## 2021-10-02 ENCOUNTER — Encounter: Payer: Self-pay | Admitting: Medical

## 2021-10-02 ENCOUNTER — Ambulatory Visit (INDEPENDENT_AMBULATORY_CARE_PROVIDER_SITE_OTHER): Payer: Medicare Other | Admitting: Medical

## 2021-10-02 VITALS — BP 100/74 | HR 67 | Ht 63.0 in | Wt 115.0 lb

## 2021-10-02 DIAGNOSIS — I700 Atherosclerosis of aorta: Secondary | ICD-10-CM

## 2021-10-02 DIAGNOSIS — I779 Disorder of arteries and arterioles, unspecified: Secondary | ICD-10-CM | POA: Diagnosis not present

## 2021-10-02 DIAGNOSIS — I5032 Chronic diastolic (congestive) heart failure: Secondary | ICD-10-CM

## 2021-10-02 DIAGNOSIS — I38 Endocarditis, valve unspecified: Secondary | ICD-10-CM | POA: Diagnosis not present

## 2021-10-02 DIAGNOSIS — G4733 Obstructive sleep apnea (adult) (pediatric): Secondary | ICD-10-CM | POA: Diagnosis not present

## 2021-10-02 DIAGNOSIS — E785 Hyperlipidemia, unspecified: Secondary | ICD-10-CM | POA: Diagnosis not present

## 2021-10-02 NOTE — Patient Instructions (Signed)
Medication Instructions:  ? ?Your physician recommends that you continue on your current medications as directed. Please refer to the Current Medication list given to you today. ? ?*If you need a refill on your cardiac medications before your next appointment, please call your pharmacy* ? ? ?Lab Work: ? ?None ordered ? ?Testing/Procedures: ? ?None ordered ? ? ?Follow-Up: ?At [facility], you and your health needs are our priority.  As part of our continuing mission to provide you with exceptional heart care, we have created designated Provider Care Teams.  These Care Teams include your primary Cardiologist (physician) and Advanced Practice Providers (APPs -  Physician Assistants and Nurse Practitioners) who all work together to provide you with the care you need, when you need it. ? ?We recommend signing up for the patient portal called "MyChart".  Sign up information is provided on this After Visit Summary.  MyChart is used to connect with patients for Virtual Visits (Telemedicine).  Patients are able to view lab/test results, encounter notes, upcoming appointments, etc.  Non-urgent messages can be sent to your provider as well.   ?To learn more about what you can do with MyChart, go to [email or web address].   ? ?Your next appointment:   ?1 year(s) ? ?The format for your next appointment:   ?In Person ? ?Provider:   ?You may see Dr. Harrell Gave End or one of the following Advanced Practice Providers on your designated Care Team:   ? ?Cadence Kathlen Mody, PA-C{ ? ? ?Important Information About Sugar ? ? ? ? ? ? ?

## 2021-10-17 ENCOUNTER — Other Ambulatory Visit: Payer: Self-pay | Admitting: Internal Medicine

## 2021-10-17 ENCOUNTER — Telehealth: Payer: Self-pay | Admitting: Internal Medicine

## 2021-10-17 DIAGNOSIS — D72819 Decreased white blood cell count, unspecified: Secondary | ICD-10-CM

## 2021-10-17 NOTE — Telephone Encounter (Signed)
Patient has a lab appt 5/26, there are no orders in. ?

## 2021-10-25 ENCOUNTER — Other Ambulatory Visit: Payer: Medicare Other

## 2021-10-26 ENCOUNTER — Other Ambulatory Visit (INDEPENDENT_AMBULATORY_CARE_PROVIDER_SITE_OTHER): Payer: Medicare Other

## 2021-10-26 DIAGNOSIS — D72819 Decreased white blood cell count, unspecified: Secondary | ICD-10-CM

## 2021-10-26 LAB — CBC WITH DIFFERENTIAL/PLATELET
Basophils Absolute: 0 10*3/uL (ref 0.0–0.1)
Basophils Relative: 0.8 % (ref 0.0–3.0)
Eosinophils Absolute: 0.1 10*3/uL (ref 0.0–0.7)
Eosinophils Relative: 1.6 % (ref 0.0–5.0)
HCT: 37.1 % (ref 36.0–46.0)
Hemoglobin: 12.3 g/dL (ref 12.0–15.0)
Lymphocytes Relative: 25.9 % (ref 12.0–46.0)
Lymphs Abs: 1.2 10*3/uL (ref 0.7–4.0)
MCHC: 33.2 g/dL (ref 30.0–36.0)
MCV: 91.1 fl (ref 78.0–100.0)
Monocytes Absolute: 0.4 10*3/uL (ref 0.1–1.0)
Monocytes Relative: 7.9 % (ref 3.0–12.0)
Neutro Abs: 3 10*3/uL (ref 1.4–7.7)
Neutrophils Relative %: 63.8 % (ref 43.0–77.0)
Platelets: 250 10*3/uL (ref 150.0–400.0)
RBC: 4.08 Mil/uL (ref 3.87–5.11)
RDW: 13.9 % (ref 11.5–15.5)
WBC: 4.7 10*3/uL (ref 4.0–10.5)

## 2021-11-17 ENCOUNTER — Other Ambulatory Visit: Payer: Self-pay | Admitting: Obstetrics and Gynecology

## 2021-11-17 DIAGNOSIS — A6004 Herpesviral vulvovaginitis: Secondary | ICD-10-CM

## 2021-12-11 ENCOUNTER — Other Ambulatory Visit: Payer: Self-pay | Admitting: Otolaryngology

## 2021-12-28 ENCOUNTER — Other Ambulatory Visit: Payer: Self-pay | Admitting: Obstetrics and Gynecology

## 2021-12-28 DIAGNOSIS — A6004 Herpesviral vulvovaginitis: Secondary | ICD-10-CM

## 2022-01-10 DIAGNOSIS — B349 Viral infection, unspecified: Secondary | ICD-10-CM | POA: Diagnosis not present

## 2022-01-14 DIAGNOSIS — J020 Streptococcal pharyngitis: Secondary | ICD-10-CM | POA: Diagnosis not present

## 2022-01-14 DIAGNOSIS — R509 Fever, unspecified: Secondary | ICD-10-CM | POA: Diagnosis not present

## 2022-01-14 DIAGNOSIS — J069 Acute upper respiratory infection, unspecified: Secondary | ICD-10-CM | POA: Diagnosis not present

## 2022-01-16 DIAGNOSIS — J020 Streptococcal pharyngitis: Secondary | ICD-10-CM | POA: Diagnosis not present

## 2022-01-16 DIAGNOSIS — A389 Scarlet fever, uncomplicated: Secondary | ICD-10-CM | POA: Diagnosis not present

## 2022-02-12 DIAGNOSIS — H35371 Puckering of macula, right eye: Secondary | ICD-10-CM | POA: Diagnosis not present

## 2022-02-14 DIAGNOSIS — G4733 Obstructive sleep apnea (adult) (pediatric): Secondary | ICD-10-CM | POA: Diagnosis not present

## 2022-02-14 DIAGNOSIS — Z6820 Body mass index (BMI) 20.0-20.9, adult: Secondary | ICD-10-CM | POA: Diagnosis not present

## 2022-02-15 ENCOUNTER — Encounter (HOSPITAL_BASED_OUTPATIENT_CLINIC_OR_DEPARTMENT_OTHER): Payer: Self-pay | Admitting: Otolaryngology

## 2022-02-22 NOTE — Progress Notes (Signed)

## 2022-02-26 ENCOUNTER — Ambulatory Visit (HOSPITAL_BASED_OUTPATIENT_CLINIC_OR_DEPARTMENT_OTHER): Payer: Medicare Other | Admitting: Certified Registered"

## 2022-02-26 ENCOUNTER — Ambulatory Visit (HOSPITAL_BASED_OUTPATIENT_CLINIC_OR_DEPARTMENT_OTHER)
Admission: RE | Admit: 2022-02-26 | Discharge: 2022-02-26 | Disposition: A | Discharge status: Home / Self Care | Payer: Medicare Other | Source: Ambulatory Visit | Attending: Otolaryngology | Admitting: Otolaryngology

## 2022-02-26 ENCOUNTER — Other Ambulatory Visit: Payer: Self-pay

## 2022-02-26 ENCOUNTER — Ambulatory Visit (HOSPITAL_COMMUNITY): Payer: Medicare Other

## 2022-02-26 ENCOUNTER — Encounter (HOSPITAL_BASED_OUTPATIENT_CLINIC_OR_DEPARTMENT_OTHER): Payer: Self-pay | Admitting: Otolaryngology

## 2022-02-26 ENCOUNTER — Encounter (HOSPITAL_BASED_OUTPATIENT_CLINIC_OR_DEPARTMENT_OTHER)
Admission: RE | Disposition: A | Discharge status: Home / Self Care | Payer: Self-pay | Source: Ambulatory Visit | Attending: Otolaryngology

## 2022-02-26 DIAGNOSIS — Z6820 Body mass index (BMI) 20.0-20.9, adult: Secondary | ICD-10-CM | POA: Diagnosis not present

## 2022-02-26 DIAGNOSIS — Z6822 Body mass index (BMI) 22.0-22.9, adult: Secondary | ICD-10-CM | POA: Diagnosis not present

## 2022-02-26 DIAGNOSIS — M1990 Unspecified osteoarthritis, unspecified site: Secondary | ICD-10-CM | POA: Diagnosis not present

## 2022-02-26 DIAGNOSIS — Z8349 Family history of other endocrine, nutritional and metabolic diseases: Secondary | ICD-10-CM | POA: Insufficient documentation

## 2022-02-26 DIAGNOSIS — F418 Other specified anxiety disorders: Secondary | ICD-10-CM | POA: Insufficient documentation

## 2022-02-26 DIAGNOSIS — Z79899 Other long term (current) drug therapy: Secondary | ICD-10-CM | POA: Insufficient documentation

## 2022-02-26 DIAGNOSIS — G4733 Obstructive sleep apnea (adult) (pediatric): Secondary | ICD-10-CM

## 2022-02-26 DIAGNOSIS — E039 Hypothyroidism, unspecified: Secondary | ICD-10-CM | POA: Insufficient documentation

## 2022-02-26 DIAGNOSIS — K219 Gastro-esophageal reflux disease without esophagitis: Secondary | ICD-10-CM | POA: Insufficient documentation

## 2022-02-26 HISTORY — PX: IMPLANTATION OF HYPOGLOSSAL NERVE STIMULATOR: SHX6827

## 2022-02-26 SURGERY — INSERTION, HYPOGLOSSAL NERVE STIMULATOR
Anesthesia: General | Site: Chest | Laterality: Right

## 2022-02-26 MED ORDER — FENTANYL CITRATE (PF) 100 MCG/2ML IJ SOLN: 25.0000 ug | INTRAMUSCULAR | Status: DC | PRN

## 2022-02-26 MED ORDER — LIDOCAINE-EPINEPHRINE 1 %-1:100000 IJ SOLN
INTRAMUSCULAR | Status: DC | PRN
  Administered 2022-02-26: 5 mL

## 2022-02-26 MED ORDER — ACETAMINOPHEN 500 MG PO TABS: 1000.0000 mg | ORAL_TABLET | Freq: Once | ORAL | Status: DC

## 2022-02-26 MED ORDER — SUCCINYLCHOLINE CHLORIDE 200 MG/10ML IV SOSY
PREFILLED_SYRINGE | INTRAVENOUS | Status: DC | PRN
  Administered 2022-02-26: 80 mg via INTRAVENOUS

## 2022-02-26 MED ORDER — PHENYLEPHRINE HCL-NACL 20-0.9 MG/250ML-% IV SOLN
INTRAVENOUS | Status: DC | PRN
  Administered 2022-02-26: 40 ug/min via INTRAVENOUS
  Administered 2022-02-26: 80 ug/min via INTRAVENOUS

## 2022-02-26 MED ORDER — EPHEDRINE SULFATE (PRESSORS) 50 MG/ML IJ SOLN
INTRAMUSCULAR | Status: DC | PRN
  Administered 2022-02-26 (×2): 10 mg via INTRAVENOUS

## 2022-02-26 MED ORDER — FENTANYL CITRATE (PF) 100 MCG/2ML IJ SOLN
INTRAMUSCULAR | Status: DC | PRN
  Administered 2022-02-26: 50 ug via INTRAVENOUS
  Administered 2022-02-26 (×2): 25 ug via INTRAVENOUS

## 2022-02-26 MED ORDER — LIDOCAINE 2% (20 MG/ML) 5 ML SYRINGE
INTRAMUSCULAR | Status: DC | PRN
  Administered 2022-02-26: 60 mg via INTRAVENOUS

## 2022-02-26 MED ORDER — FENTANYL CITRATE (PF) 100 MCG/2ML IJ SOLN
INTRAMUSCULAR | Status: AC
  Filled 2022-02-26: qty 2

## 2022-02-26 MED ORDER — PROPOFOL 500 MG/50ML IV EMUL
INTRAVENOUS | Status: DC | PRN
  Administered 2022-02-26: 25 ug/kg/min via INTRAVENOUS

## 2022-02-26 MED ORDER — HYDROCODONE-ACETAMINOPHEN 5-325 MG PO TABS: 1.0000 | ORAL_TABLET | Freq: Four times a day (QID) | ORAL | 0 refills | Status: DC | PRN

## 2022-02-26 MED ORDER — AMISULPRIDE (ANTIEMETIC) 5 MG/2ML IV SOLN: 10.0000 mg | Freq: Once | INTRAVENOUS | Status: DC | PRN

## 2022-02-26 MED ORDER — OXYCODONE HCL 5 MG/5ML PO SOLN: 5.0000 mg | Freq: Once | ORAL | Status: AC | PRN

## 2022-02-26 MED ORDER — ONDANSETRON HCL 4 MG/2ML IJ SOLN
INTRAMUSCULAR | Status: DC | PRN
  Administered 2022-02-26: 4 mg via INTRAVENOUS

## 2022-02-26 MED ORDER — VANCOMYCIN HCL IN DEXTROSE 1-5 GM/200ML-% IV SOLN
1000.0000 mg | INTRAVENOUS | Status: AC
  Administered 2022-02-26: 1000 mg via INTRAVENOUS

## 2022-02-26 MED ORDER — OXYCODONE HCL 5 MG PO TABS
5.0000 mg | ORAL_TABLET | Freq: Once | ORAL | Status: AC | PRN
  Administered 2022-02-26: 5 mg via ORAL

## 2022-02-26 MED ORDER — 0.9 % SODIUM CHLORIDE (POUR BTL) OPTIME
TOPICAL | Status: DC | PRN
  Administered 2022-02-26: 120 mL

## 2022-02-26 MED ORDER — OXYCODONE HCL 5 MG PO TABS
ORAL_TABLET | ORAL | Status: AC
  Filled 2022-02-26: qty 1

## 2022-02-26 MED ORDER — LACTATED RINGERS IV SOLN: INTRAVENOUS | Status: DC | PRN

## 2022-02-26 MED ORDER — VANCOMYCIN HCL IN DEXTROSE 1-5 GM/200ML-% IV SOLN
INTRAVENOUS | Status: AC
  Filled 2022-02-26: qty 200

## 2022-02-26 MED ORDER — PROPOFOL 10 MG/ML IV BOLUS
INTRAVENOUS | Status: DC | PRN
  Administered 2022-02-26: 20 mg via INTRAVENOUS
  Administered 2022-02-26: 80 mg via INTRAVENOUS

## 2022-02-26 SURGICAL SUPPLY — 68 items
ACC NRSTM 4 TRQ WRNCH STRL (MISCELLANEOUS)
ADH SKN CLS APL DERMABOND .7 (GAUZE/BANDAGES/DRESSINGS) ×2
BLADE CLIPPER SURG (BLADE) IMPLANT
BLADE SURG 15 STRL LF DISP TIS (BLADE) ×1 IMPLANT
BLADE SURG 15 STRL SS (BLADE) ×1
CANISTER SUCT 1200ML W/VALVE (MISCELLANEOUS) ×1 IMPLANT
CORD BIPOLAR FORCEPS 12FT (ELECTRODE) ×1 IMPLANT
COVER PROBE W GEL 5X96 (DRAPES) ×1 IMPLANT
DERMABOND ADVANCED .7 DNX12 (GAUZE/BANDAGES/DRESSINGS) ×2 IMPLANT
DRAPE C-ARM 35X43 STRL (DRAPES) ×1 IMPLANT
DRAPE HEAD BAR (DRAPES) IMPLANT
DRAPE INCISE IOBAN 66X45 STRL (DRAPES) ×1 IMPLANT
DRAPE MICROSCOPE WILD 40.5X102 (DRAPES) ×1 IMPLANT
DRAPE UTILITY XL STRL (DRAPES) ×1 IMPLANT
DRSG TEGADERM 2-3/8X2-3/4 SM (GAUZE/BANDAGES/DRESSINGS) ×2 IMPLANT
DRSG TEGADERM 4X4.75 (GAUZE/BANDAGES/DRESSINGS) IMPLANT
ELECT COATED BLADE 2.86 ST (ELECTRODE) ×1 IMPLANT
ELECT EMG 18 NIMS (NEUROSURGERY SUPPLIES) ×1
ELECT REM PT RETURN 9FT ADLT (ELECTROSURGICAL) ×1
ELECTRODE EMG 18 NIMS (NEUROSURGERY SUPPLIES) ×1 IMPLANT
ELECTRODE REM PT RTRN 9FT ADLT (ELECTROSURGICAL) ×1 IMPLANT
FORCEPS BIPOLAR SPETZLER 8 1.0 (NEUROSURGERY SUPPLIES) ×1 IMPLANT
GAUZE 4X4 16PLY ~~LOC~~+RFID DBL (SPONGE) ×1 IMPLANT
GAUZE SPONGE 4X4 12PLY STRL (GAUZE/BANDAGES/DRESSINGS) ×1 IMPLANT
GENERATOR PULSE INSPIRE (Generator) ×1 IMPLANT
GENERATOR PULSE INSPIRE IV (Generator) ×1 IMPLANT
GLOVE BIO SURGEON STRL SZ 6.5 (GLOVE) IMPLANT
GLOVE BIO SURGEON STRL SZ7.5 (GLOVE) ×1 IMPLANT
GLOVE BIOGEL PI IND STRL 7.0 (GLOVE) IMPLANT
GLOVE BIOGEL PI IND STRL 7.5 (GLOVE) IMPLANT
GLOVE SURG SS PI 6.5 STRL IVOR (GLOVE) IMPLANT
GLOVE SURG SS PI 7.0 STRL IVOR (GLOVE) IMPLANT
GOWN STRL REUS W/ TWL LRG LVL3 (GOWN DISPOSABLE) ×3 IMPLANT
GOWN STRL REUS W/TWL LRG LVL3 (GOWN DISPOSABLE) ×3
IV CATH 18G SAFETY (IV SOLUTION) ×1 IMPLANT
KIT NEURO ACCESSORY W/WRENCH (MISCELLANEOUS) IMPLANT
LEAD SENSING RESP INSPIRE (Lead) ×1 IMPLANT
LEAD SENSING RESP INSPIRE IV (Lead) ×1 IMPLANT
LEAD SLEEP STIM INSPIRE IV/V (Lead) ×1 IMPLANT
LEAD SLEEP STIMULATION INSPIRE (Lead) ×1 IMPLANT
LOOP VESSEL MAXI BLUE (MISCELLANEOUS) ×1 IMPLANT
LOOP VESSEL MINI RED (MISCELLANEOUS) ×1 IMPLANT
MARKER SKIN DUAL TIP RULER LAB (MISCELLANEOUS) ×1 IMPLANT
NDL HYPO 25X1 1.5 SAFETY (NEEDLE) ×1 IMPLANT
NEEDLE HYPO 25X1 1.5 SAFETY (NEEDLE) ×1 IMPLANT
NS IRRIG 1000ML POUR BTL (IV SOLUTION) ×1 IMPLANT
PACK BASIN DAY SURGERY FS (CUSTOM PROCEDURE TRAY) ×1 IMPLANT
PACK ENT DAY SURGERY (CUSTOM PROCEDURE TRAY) ×1 IMPLANT
PASSER CATH 36 CODMAN DISP (NEUROSURGERY SUPPLIES) IMPLANT
PASSER CATH 38CM DISP (INSTRUMENTS) IMPLANT
PENCIL SMOKE EVACUATOR (MISCELLANEOUS) ×1 IMPLANT
PROBE NERVE STIMULATOR (NEUROSURGERY SUPPLIES) ×1 IMPLANT
REMOTE CONTROL SLEEP INSPIRE (MISCELLANEOUS) ×1 IMPLANT
SET WALTER ACTIVATION W/DRAPE (SET/KITS/TRAYS/PACK) ×1 IMPLANT
SLEEVE SCD COMPRESS KNEE MED (STOCKING) ×1 IMPLANT
SPONGE INTESTINAL PEANUT (DISPOSABLE) ×1 IMPLANT
SUT SILK 2 0 SH (SUTURE) ×1 IMPLANT
SUT SILK 3 0 REEL (SUTURE) ×1 IMPLANT
SUT SILK 3 0 SH 30 (SUTURE) ×1 IMPLANT
SUT SILK 3-0 (SUTURE) ×2
SUT SILK 3-0 RB1 30XBRD (SUTURE) ×2
SUT VIC AB 3-0 SH 27 (SUTURE) ×1
SUT VIC AB 3-0 SH 27X BRD (SUTURE) ×1 IMPLANT
SUT VIC AB 4-0 PS2 27 (SUTURE) ×1 IMPLANT
SUTURE SILK 3-0 RB1 30XBRD (SUTURE) ×2 IMPLANT
SYR 10ML LL (SYRINGE) ×1 IMPLANT
SYR BULB EAR ULCER 3OZ GRN STR (SYRINGE) ×1 IMPLANT
TOWEL GREEN STERILE FF (TOWEL DISPOSABLE) ×2 IMPLANT

## 2022-02-26 NOTE — Op Note (Signed)

## 2022-02-26 NOTE — Discharge Instructions (Addendum)

## 2022-02-26 NOTE — Transfer of Care (Signed)
Immediate Anesthesia Transfer of Care Note  Patient: Sandra Horton  Procedure(s) Performed: IMPLANTATION OF HYPOGLOSSAL NERVE STIMULATOR (Right: Chest)  Patient Location: PACU  Anesthesia Type:General  Level of Consciousness: awake, alert , oriented and patient cooperative  Airway & Oxygen Therapy: Patient Spontanous Breathing and Patient connected to face mask oxygen  Post-op Assessment: Report given to RN and Post -op Vital signs reviewed and stable  Post vital signs: Reviewed and stable  Last Vitals:  Vitals Value Taken Time  BP    Temp    Pulse 84 02/26/22 1500  Resp 9 02/26/22 1500  SpO2 100 % 02/26/22 1500  Vitals shown include unvalidated device data.  Last Pain:  Vitals:   02/26/22 1136  TempSrc: Oral  PainSc: 0-No pain      Patients Stated Pain Goal: 3 (37/36/68 1594)  Complications: No notable events documented.

## 2022-02-26 NOTE — H&P (Signed)
Sandra Horton is an 71 y.o. female.   Chief Complaint: Sleep apnea HPI: 71 year old female with obstructive sleep apnea who has been unable to tolerate CPAP.  Past Medical History:  Diagnosis Date   Allergy    Anti-cardiolipin antibody positive    Dr. Jefm Bryant    Anxiety    Arthritis    BRCA negative 2016   MyRisk neg    Depression    Family history of ovarian cancer 2016   MyRisk neg; affected sister is BRCA neg   GERD (gastroesophageal reflux disease)    Herpes simplex    Hyperlipidemia    Hypothyroidism    Osteopenia    DEXA 09/02/13    Rosacea    Ruptured disc, cervical    Sleep apnea    no CPAP   Sleep apnea    Vertigo    per pt cervicogenic Physical therapy helped    Vitamin D deficiency    Wears dentures    full upper    Past Surgical History:  Procedure Laterality Date   CERVICAL BIOPSY  W/ LOOP ELECTRODE EXCISION     COLONOSCOPY     COLONOSCOPY WITH PROPOFOL N/A 03/30/2018   Procedure: COLONOSCOPY WITH Biopsies;  Surgeon: [name], MD;  Location: [place];  Service: Endoscopy;  Laterality: N/A;   DILATION AND CURETTAGE OF UTERUS  30 years ago   DRUG INDUCED ENDOSCOPY N/A 09/25/2021   Procedure: DRUG INDUCED ENDOSCOPY;  Surgeon: Melida Quitter, MD;  Location: [place];  Service: ENT;  Laterality: N/A;   EYE SURGERY     b/l cataract    POLYPECTOMY N/A 03/30/2018   Procedure: POLYPECTOMY;  Surgeon: [name], MD;  Location: [place];  Service: Endoscopy;  Laterality: N/A;    Family History  Problem Relation Age of Onset   Cancer Mother        kidney met to lungs    Diabetes Mother    Thyroid disease Mother    Heart disease Father    Hypertension Father    Stroke Father    Heart attack Father 59   Thyroid disease Sister    Ovarian cancer Sister 70       BRCA neg   Colon cancer Maternal Aunt 21   Diabetes Daughter        type 1    Breast cancer Neg Hx    Social History:  reports that she has never smoked.  She has never used smokeless tobacco. She reports that she does not drink alcohol and does not use drugs.  Allergies:  Allergies  Allergen Reactions   Penicillin V Potassium Anaphylaxis   Penicillins Anaphylaxis   Tetanus Toxoid Nausea And Vomiting    Medications Prior to Admission  Medication Sig Dispense Refill   Ascorbic Acid (VITAMIN C) 500 MG CHEW Chew 1 tablet by mouth daily.     aspirin 81 MG tablet Take 81 mg by mouth daily.     Calcium 500 MG tablet Take 500 mg by mouth in the morning and at bedtime.     cholecalciferol (VITAMIN D3) 25 MCG (1000 UNIT) tablet Take 1,000 Units by mouth daily.     estradiol (ESTRACE) 0.1 MG/GM vaginal cream Insert 1 g vaginally once weekly 42.5 g 3   Ibuprofen (MOTRIN PO) Take by mouth as needed.     levothyroxine (SYNTHROID) 88 MCG tablet Take 1 tablet (88 mcg total) by mouth daily before breakfast. Empty stomach 90 tablet 3  magnesium chloride (SLOW-MAG) 64 MG TBEC SR tablet Take 1 tablet by mouth daily.     Multiple Vitamins-Minerals (MULTIVITAMIN ADULT PO) Take 1 tablet by mouth daily.     omeprazole (PRILOSEC) 40 MG capsule Take 1 capsule (40 mg total) by mouth daily. Wait 3-4 hours after thyroid med. Take 30 minutes before food lunch/dinner 90 capsule 3   PARoxetine (PAXIL) 20 MG tablet Take 1 tablet (20 mg total) by mouth daily. 90 tablet 3   polyethylene glycol powder (GLYCOLAX/MIRALAX) 17 GM/SCOOP powder Take 17 g by mouth daily as needed. 850 g 11   rosuvastatin (CRESTOR) 40 MG tablet Take 1 tablet (40 mg total) by mouth daily. 90 tablet 3   valACYclovir (VALTREX) 500 MG tablet TAKE 1 TAB DAILY AS PREVENTIVE OR TWICE DAILY FOR 3 DAYS AS NEEDED FOR SYMPTOM FLARE 180 tablet 1   Meclizine HCl (ANTIVERT PO) Take by mouth as needed.      No results found for this or any previous visit (from the past 48 hour(s)). No results found.  Review of Systems  All other systems reviewed and are negative.   Blood pressure 112/65, pulse 65,  temperature 98.1 F (36.7 C), temperature source Oral, height 5' 3" (1.6 m), weight 52.7 kg, SpO2 100 %. Physical Exam Constitutional:      Appearance: Normal appearance. She is normal weight.  HENT:     Head: Normocephalic and atraumatic.     Right Ear: External ear normal.     Left Ear: External ear normal.     Nose: Nose normal.     Mouth/Throat:     Mouth: Mucous membranes are moist.     Pharynx: Oropharynx is clear.  Eyes:     Extraocular Movements: Extraocular movements intact.     Conjunctiva/sclera: Conjunctivae normal.     Pupils: Pupils are equal, round, and reactive to light.  Cardiovascular:     Rate and Rhythm: Normal rate.  Pulmonary:     Effort: Pulmonary effort is normal.  Musculoskeletal:     Cervical back: Normal range of motion.  Neurological:     General: No focal deficit present.     Mental Status: She is alert and oriented to person, place, and time.  Psychiatric:        Mood and Affect: Mood normal.        Behavior: Behavior normal.        Thought Content: Thought content normal.        Judgment: Judgment normal.      Assessment/Plan Obstructive sleep apnea and BMI 20.58  To OR for hypoglossal nerve stimulator placement.  Dwight Bates, MD 02/26/2022, 12:31 PM    

## 2022-02-26 NOTE — Brief Op Note (Signed)
02/26/2022  2:46 PM  PATIENT:  Sandra Horton  71 y.o. female  PRE-OPERATIVE DIAGNOSIS:  Obstructive Sleep Apnea  POST-OPERATIVE DIAGNOSIS:  Obstructive Sleep Apnea  PROCEDURE:  Procedure(s): IMPLANTATION OF HYPOGLOSSAL NERVE STIMULATOR (Right)  SURGEON:  Surgeon(s) and Role:    Melida Quitter, MD - Primary  PHYSICIAN ASSISTANT:   ASSISTANTS: none   ANESTHESIA:   general  EBL:  Minimal   BLOOD ADMINISTERED:none  DRAINS: none   LOCAL MEDICATIONS USED:  LIDOCAINE   SPECIMEN:  No Specimen  DISPOSITION OF SPECIMEN:  N/A  COUNTS:  YES  TOURNIQUET:  * No tourniquets in log *  DICTATION: .Note written in EPIC  PLAN OF CARE: Discharge to home after PACU  PATIENT DISPOSITION:  PACU - hemodynamically stable.   Delay start of Pharmacological VTE agent (>24hrs) due to surgical blood loss or risk of bleeding: no

## 2022-02-26 NOTE — Anesthesia Preprocedure Evaluation (Addendum)
Anesthesia Evaluation  Patient identified by MRN, date of birth, ID band Patient awake    Reviewed: Allergy & Precautions, NPO status , Patient's Chart, lab work & pertinent test results  History of Anesthesia Complications Negative for: history of anesthetic complications  Airway Mallampati: II  TM Distance: >3 FB Neck ROM: Full    Dental  (+) Edentulous Upper, Missing,    Pulmonary sleep apnea ,    Pulmonary exam normal        Cardiovascular negative cardio ROS Normal cardiovascular exam     Neuro/Psych Anxiety Depression    GI/Hepatic Neg liver ROS, GERD  Medicated and Controlled,  Endo/Other  Hypothyroidism   Renal/GU negative Renal ROS  negative genitourinary   Musculoskeletal  (+) Arthritis ,   Abdominal   Peds  Hematology negative hematology ROS (+)   Anesthesia Other Findings Day of surgery medications reviewed with patient.  Reproductive/Obstetrics negative OB ROS                            Anesthesia Physical Anesthesia Plan  ASA: 2  Anesthesia Plan: General   Post-op Pain Management: Tylenol PO (pre-op)*   Induction: Intravenous  PONV Risk Score and Plan: 3 and Treatment may vary due to age or medical condition, Dexamethasone and Ondansetron  Airway Management Planned: Oral ETT  Additional Equipment: None  Intra-op Plan:   Post-operative Plan: Extubation in OR  Informed Consent: I have reviewed the patients History and Physical, chart, labs and discussed the procedure including the risks, benefits and alternatives for the proposed anesthesia with the patient or authorized representative who has indicated his/her understanding and acceptance.     Dental advisory given  Plan Discussed with: CRNA  Anesthesia Plan Comments:        Anesthesia Quick Evaluation

## 2022-02-26 NOTE — Anesthesia Procedure Notes (Signed)
Procedure Name: Intubation Date/Time: 02/26/2022 1:21 PM  Performed by: Krishawna Stiefel, Ernesta Amble, CRNAPre-anesthesia Checklist: Patient identified, Emergency Drugs available, Suction available and Patient being monitored Patient Re-evaluated:Patient Re-evaluated prior to induction Oxygen Delivery Method: Circle system utilized Preoxygenation: Pre-oxygenation with 100% oxygen Induction Type: IV induction Ventilation: Mask ventilation without difficulty Laryngoscope Size: Mac and 3 Grade View: Grade I Tube type: Oral Tube size: 7.0 mm Number of attempts: 1 Airway Equipment and Method: Stylet and Oral airway Placement Confirmation: ETT inserted through vocal cords under direct vision, positive ETCO2 and breath sounds checked- equal and bilateral Secured at: 21 cm Tube secured with: Tape Dental Injury: Teeth and Oropharynx as per pre-operative assessment

## 2022-02-26 NOTE — Anesthesia Postprocedure Evaluation (Signed)
Anesthesia Post Note  Patient: Sandra Horton  Procedure(s) Performed: IMPLANTATION OF HYPOGLOSSAL NERVE STIMULATOR (Right: Chest)     Patient location during evaluation: PACU Anesthesia Type: General Level of consciousness: awake and alert Pain management: pain level controlled Vital Signs Assessment: post-procedure vital signs reviewed and stable Respiratory status: spontaneous breathing, nonlabored ventilation and respiratory function stable Cardiovascular status: blood pressure returned to baseline Postop Assessment: no apparent nausea or vomiting Anesthetic complications: no   No notable events documented.  Last Vitals:  Vitals:   02/26/22 1545 02/26/22 1600  BP: 136/83 131/82  Pulse: 84 82  Resp: 16 18  Temp:    SpO2: 100% 97%    Last Pain:  Vitals:   02/26/22 1600  TempSrc:   PainSc: [place]

## 2022-02-27 ENCOUNTER — Encounter (HOSPITAL_BASED_OUTPATIENT_CLINIC_OR_DEPARTMENT_OTHER): Payer: Self-pay | Admitting: Otolaryngology

## 2022-03-29 ENCOUNTER — Ambulatory Visit: Payer: Medicare Other | Admitting: Adult Health

## 2022-04-05 ENCOUNTER — Encounter: Payer: Self-pay | Admitting: Adult Health

## 2022-04-05 ENCOUNTER — Ambulatory Visit (INDEPENDENT_AMBULATORY_CARE_PROVIDER_SITE_OTHER): Payer: Medicare Other | Admitting: Adult Health

## 2022-04-05 DIAGNOSIS — G4733 Obstructive sleep apnea (adult) (pediatric): Secondary | ICD-10-CM

## 2022-04-05 NOTE — Patient Instructions (Addendum)
Use INSPIRE device all night while sleeping .  Titrate Inspire levels as discussed weekly  Call for any questions or issues with INSPIRE  Follow up with Dr. Halford Chessman  or Ashaunte Standley NP in 4 weeks and As needed

## 2022-04-05 NOTE — Progress Notes (Signed)
Reviewed and agree with assessment/plan.   Chesley Mires, MD [facility] Pulmonary/Critical Care 04/05/2022, 12:54 PM Pager:  [phone]

## 2022-04-05 NOTE — Assessment & Plan Note (Addendum)
Moderate OSA (CPAP intolerant) s/p INSPIRE device placement.  Device site looks good. Activation in office, tongue is midline.  Waveform form simulation appeared correct .  Remote activation was completed. Patient education was given.  Start delay 30 min , Pause 59mn , duration 10 hr.  Sensation 0.2 and functional level 0.2 v, Lower limit 0.2 and upper limit 1.2v   Plan  Patient Instructions  Use INSPIRE device all night while sleeping .  Titrate Inspire levels as discussed weekly  Call for any questions or issues with INSPIRE  Follow up with Dr. SHalford Chessman or Dearies Meikle NP in 4 weeks and As needed

## 2022-04-05 NOTE — Progress Notes (Signed)
<[id number]>_[id number]<\/[id number]>_7  ID: Sandra Horton, female    DOB: 05-31-1951, 71 y.o.   MRN: [id number]  Chief Complaint  Patient presents with   Office Visit    Referring provider: McLean-Scocuzza, Olivia Mackie *  HPI: 71 yo female followed for OSA intolerant to CPAP. Underwent INSPIRE device placement 02/2022 (Dr. Redmond Baseman)   TEST/EVENTS :  S/p hypoglossal nerve stimulator placement 02/26/22   PFT 01/15/21 >> FEV1 2.28 (106%), FEV1% 83, TLC 4.90 (90%), DLCO 94%   Sleep Tests:  HST 02/27/21 >> AHI 23.4, SpO2 low 81%. Auto CPAP 06/11/21 to 09/08/21 >> used on 49 of 90 nights with average 5 hrs 45 min.  Average AHI 2.8 with mean CPAP 8 and 95 th percentile CPAP 11 cm H2O   Cardiac Tests:  Echo 11/10/20 >> EF 55 to 60%, grade 1 DD, mild MR  04/05/2022 Follow up ; OSA  Patient returns for a follow up visit for sleep apnea. She was diagnosed with OSA in September 2022, started on CPAP but was intolerant. Referred for the INSPIRE device. Hypoglossal nerve stimulator placed 02/26/22 by Dr. Redmond Baseman . She returns today to office for activation of her device. Says she has done well , no issues since placement . No redness or swelling /drainage at surgical sites. She denies any involuntary tongue movements or deviation .  Inspire team here for visit today .    Allergies  Allergen Reactions   Penicillin V Potassium Anaphylaxis   Penicillins Anaphylaxis   Tetanus Toxoid Nausea And Vomiting    Immunization History  Administered Date(s) Administered   Hepatitis B, adult 05/01/2017, 05/29/2017, 10/29/2017   Influenza, High Dose Seasonal PF 04/02/2018, 03/27/2021   Influenza, Seasonal, Injecte, Preservative Fre 03/03/2012   Influenza,inj,Quad PF,6+ Mos 06/15/2019   Influenza-Unspecified 03/23/2020   Pneumococcal Conjugate-13 02/22/2016, 04/21/2017   Pneumococcal Polysaccharide-23 04/22/2018   Zoster Recombinat (Shingrix) 12/06/2019, 02/14/2020   Zoster, Live 07/30/2012    Past Medical History:  Diagnosis Date   Allergy     Anti-cardiolipin antibody positive    Dr. Jefm Bryant    Anxiety    Arthritis    BRCA negative 2016   MyRisk neg    Depression    Family history of ovarian cancer 2016   MyRisk neg; affected sister is BRCA neg   GERD (gastroesophageal reflux disease)    Herpes simplex    Hyperlipidemia    Hypothyroidism    Osteopenia    DEXA 09/02/13    Rosacea    Ruptured disc, cervical    Sleep apnea    no CPAP   Sleep apnea    Vertigo    per pt cervicogenic Physical therapy helped    Vitamin D deficiency    Wears dentures    full upper    Tobacco History: Social History   Tobacco Use  Smoking Status Never  Smokeless Tobacco Never   Counseling given: Not Answered   Outpatient Medications Prior to Visit  Medication Sig Dispense Refill   Ascorbic Acid (VITAMIN C) 500 MG CHEW Chew 1 tablet by mouth daily.     aspirin 81 MG tablet Take 81 mg by mouth daily.     Calcium 500 MG tablet Take 500 mg by mouth in the morning and at bedtime.     cholecalciferol (VITAMIN D3) 25 MCG (1000 UNIT) tablet Take 1,000 Units by mouth daily.     estradiol (ESTRACE) 0.1 MG/GM vaginal cream Insert 1 g vaginally once weekly 42.5 g 3   Ibuprofen (MOTRIN PO) Take  by mouth as needed.     levothyroxine (SYNTHROID) 88 MCG tablet Take 1 tablet (88 mcg total) by mouth daily before breakfast. Empty stomach 90 tablet 3   magnesium chloride (SLOW-MAG) 64 MG TBEC SR tablet Take 1 tablet by mouth daily.     Meclizine HCl (ANTIVERT PO) Take by mouth as needed.     Multiple Vitamins-Minerals (MULTIVITAMIN ADULT PO) Take 1 tablet by mouth daily.     omeprazole (PRILOSEC) 40 MG capsule Take 1 capsule (40 mg total) by mouth daily. Wait 3-4 hours after thyroid med. Take 30 minutes before food lunch/dinner 90 capsule 3   PARoxetine (PAXIL) 20 MG tablet Take 1 tablet (20 mg total) by mouth daily. 90 tablet 3   polyethylene glycol powder (GLYCOLAX/MIRALAX) 17 GM/SCOOP powder Take 17 g by mouth daily as needed. 850 g 11    rosuvastatin (CRESTOR) 40 MG tablet Take 1 tablet (40 mg total) by mouth daily. 90 tablet 3   valACYclovir (VALTREX) 500 MG tablet TAKE 1 TAB DAILY AS PREVENTIVE OR TWICE DAILY FOR 3 DAYS AS NEEDED FOR SYMPTOM FLARE 180 tablet 1   HYDROcodone-acetaminophen (NORCO/VICODIN) 5-325 MG tablet Take 1-2 tablets by mouth every 6 (six) hours as needed for moderate pain. 12 tablet 0   No facility-administered medications prior to visit.     Review of Systems:   Constitutional:   No  weight loss, night sweats,  Fevers, chills, fatigue, or  lassitude.  HEENT:   No headaches,  Difficulty swallowing,  Tooth/dental problems, or  Sore throat,                No sneezing, itching, ear ache, nasal congestion, post nasal drip,   CV:  No chest pain,  Orthopnea, PND, swelling in lower extremities, anasarca, dizziness, palpitations, syncope.   GI  No heartburn, indigestion, abdominal pain, nausea, vomiting, diarrhea, change in bowel habits, loss of appetite, bloody stools.   Resp: No shortness of breath with exertion or at rest.  No excess mucus, no productive cough,  No non-productive cough,  No coughing up of blood.  No change in color of mucus.  No wheezing.  No chest wall deformity  Skin: no rash or lesions.  GU: no dysuria, change in color of urine, no urgency or frequency.  No flank pain, no hematuria   MS:  No joint pain or swelling.  No decreased range of motion.  No back pain.    Physical Exam  BP (!) 106/58 (BP Location: Left Arm, Patient Position: Sitting, Cuff Size: Normal)   Pulse 67   Temp 97.8 F (36.6 C) (Oral)   Ht _0  (1.6 m)   Wt 118 lb 3.2 oz (53.6 kg)   SpO2 100% Comment: RA  BMI 20.94 kg/m   GEN: A/Ox3; pleasant , NAD, well nourished    HEENT:  [place]/AT,  EACs-clear, TMs-wnl, NOSE-clear, THROAT-clear, no lesions, no postnasal drip or exudate noted.   NECK:  Supple w/ fair ROM; no JVD; normal carotid impulses w/o bruits; no thyromegaly or nodules palpated; no  lymphadenopathy.    RESP  Clear  P & A; w/o, wheezes/ rales/ or rhonchi. no accessory muscle use, no dullness to percussion  CARD:  RRR, no m/r/g, no peripheral edema, pulses intact, no cyanosis or clubbing.  GI:   Soft & nt; nml bowel sounds; no organomegaly or masses detected.   Musco: Warm bil, no deformities or joint swelling noted.   Neuro: alert, no focal deficits noted.  Skin: Warm, no lesions or rashes    Lab Results:  CBC    Component Value Date/Time   WBC 4.7 10/26/2021 1007   RBC 4.08 10/26/2021 1007   HGB 12.3 10/26/2021 1007   HGB 13.1 06/07/2019 0954   HCT 37.1 10/26/2021 1007   HCT 39.6 06/07/2019 0954   PLT 250.0 10/26/2021 1007   PLT 230 06/07/2019 0954   MCV 91.1 10/26/2021 1007   MCV 98 (H) 06/07/2019 0954   MCH 32.4 06/07/2019 0954   MCH 32.0 07/12/2016 0509   MCHC 33.2 10/26/2021 1007   RDW 13.9 10/26/2021 1007   RDW 11.8 06/07/2019 0954   LYMPHSABS 1.2 10/26/2021 1007   LYMPHSABS 1.3 06/07/2019 0954   MONOABS 0.4 10/26/2021 1007   EOSABS 0.1 10/26/2021 1007   EOSABS 0.2 06/07/2019 0954   BASOSABS 0.0 10/26/2021 1007   BASOSABS 0.0 06/07/2019 0954    BMET    Component Value Date/Time   NA 140 09/25/2021 0843   NA 141 06/07/2019 0954   K 4.0 09/25/2021 0843   CL 105 09/25/2021 0843   CO2 29 09/25/2021 0843   GLUCOSE 88 09/25/2021 0843   BUN 8 09/25/2021 0843   BUN 11 06/07/2019 0954   CREATININE 0.83 09/25/2021 0843   CALCIUM 9.5 09/25/2021 0843   GFRNONAA 63 06/07/2019 0954   GFRAA 73 06/07/2019 0954    BNP No results found for: "BNP"  ProBNP No results found for: "PROBNP"  Imaging: No results found.        No data to display          No results found for: "NITRICOXIDE"      Assessment & Plan:   OSA (obstructive sleep apnea) Moderate OSA (CPAP intolerant) s/p INSPIRE device placement.  Device site looks good. Activation in office, tongue is midline.  Waveform form simulation appeared correct .  Remote  activation was completed. Patient education was given.  Start delay 30 min , Pause 29mn , duration 10 hr.  Sensation 0.2 and functional level 0.2 v, Lower limit 0.2 and upper limit 1.2v   Plan  Patient Instructions  Use INSPIRE device all night while sleeping .  Titrate Inspire levels as discussed weekly  Call for any questions or issues with INSPIRE  Follow up with Dr. SHalford Chessman or Kemond Amorin NP in 4 weeks and As needed         I spent  32 minutes dedicated to the care of this patient on the date of this encounter to include pre-visit review of records, face-to-face time with the patient discussing conditions above, post visit ordering of testing, clinical documentation with the electronic health record, making appropriate referrals as documented, and communicating necessary findings to members of the patients care team.    TRexene Edison NP 04/05/2022

## 2022-05-06 ENCOUNTER — Ambulatory Visit (INDEPENDENT_AMBULATORY_CARE_PROVIDER_SITE_OTHER): Payer: Medicare Other | Admitting: Pulmonary Disease

## 2022-05-06 ENCOUNTER — Encounter (HOSPITAL_BASED_OUTPATIENT_CLINIC_OR_DEPARTMENT_OTHER): Payer: Self-pay | Admitting: Pulmonary Disease

## 2022-05-06 VITALS — BP 108/74 | HR 78 | Temp 98.3°F | Ht 63.0 in | Wt 121.2 lb

## 2022-05-06 DIAGNOSIS — G4733 Obstructive sleep apnea (adult) (pediatric): Secondary | ICD-10-CM

## 2022-05-06 NOTE — Patient Instructions (Signed)
Will schedule sleep study  Follow up in 6 weeks with Dr. Halford Chessman or [facility]

## 2022-05-06 NOTE — Progress Notes (Signed)
[place] [facility], Critical Care, and Sleep Medicine  Chief Complaint  Patient presents with   Follow-up    Inspire Follow up     Constitutional:  BP 108/74 (BP Location: Left Arm, Patient Position: Sitting, Cuff Size: Normal)   Pulse 78   Temp 98.3 F (36.8 C) (Oral)   Ht '5\' 3"'$  (1.6 m)   Wt 121 lb 3.2 oz (55 kg)   SpO2 98%   BMI 21.47 kg/m   Past Medical History:  Allergies, Anti cardiolipin Ab, Anxiety, Depression, GERD, HSV, HLD, Hypothyroidism, Osteopenia, Rosacea, Vertigo, Vit D deficiency  Past Surgical History:  She  has a past surgical history that includes Colonoscopy; Dilation and curettage of uterus (30 years ago); Cervical biopsy w/ loop electrode excision; Eye surgery; Colonoscopy with propofol (N/A, 03/30/2018); polypectomy (N/A, 03/30/2018); Drug induced endoscopy (N/A, 09/25/2021); and Implantation of hypoglossal nerve stimulator (Right, 02/26/2022).  Brief Summary:  Sandra Horton is a 71 y.o. female with obstructive sleep apnea.      Subjective:   She had Inspire device implanted in September.  She is doing well.  Device site has healed nicely.  She is not snore.  Sleeping better, and her husband feels her sleep is improved.  Not having difficulty with speech or swallowing.  Denies tongue or neck pain.  Physical Exam:   Appearance - well kempt   ENMT - no sinus tenderness, no oral exudate, no LAN, Mallampati 3 airway, no stridor, wears dentures  Respiratory - equal breath sounds bilaterally, no wheezing or rales  CV - s1s2 regular rate and rhythm, no murmurs  Ext - no clubbing, no edema  Skin - no rashes  Psych - normal mood and affect     Pulmonary testing:  PFT 01/15/21 >> FEV1 2.28 (106%), FEV1% 83, TLC 4.90 (90%), DLCO 94%  Sleep Tests:  HST 02/27/21 >> AHI 23.4, SpO2 low 81%. Auto CPAP 06/11/21 to 09/08/21 >> used on 49 of 90 nights with average 5 hrs 45 min.  Average AHI 2.8 with mean CPAP 8 and 95 th percentile CPAP 11 cm H2O  Cardiac  Tests:  Echo 11/10/20 >> EF 55 to 60%, grade 1 DD, mild MR  Social History:  She  reports that she has never smoked. She has never used smokeless tobacco. She reports that she does not drink alcohol and does not use drugs.  Family History:  Her family history includes Cancer in her mother; Colon cancer (age of onset: 81) in her maternal aunt; Diabetes in her daughter and mother; Heart attack (age of onset: 11) in her father; Heart disease in her father; Hypertension in her father; Ovarian cancer (age of onset: 58) in her sister; Stroke in her father; Thyroid disease in her mother and sister.      Assessment/Plan:   Obstructive sleep apnea. - intolerant of CPAP - had Inspire implanted in September 2023 and doing very well - will have follow up in 6 weeks - will arrange for Inspire titration study in the sleep lab  Time Spent Involved in Patient Care on Day of Examination:  26 minutes  Follow up:   Patient Instructions  Will schedule sleep study  Follow up in 6 weeks with Dr. Halford Chessman or Tammy Parrett  Medication List:   Allergies as of 05/06/2022       Reactions   Penicillin V Potassium Anaphylaxis   Penicillins Anaphylaxis   Tetanus Toxoid Nausea And Vomiting        Medication List  Accurate as of May 06, 2022 11:41 AM. If you have any questions, ask your nurse or doctor.          STOP taking these medications    estradiol 0.1 MG/GM vaginal cream Commonly known as: ESTRACE Stopped by: Chesley Mires, MD       TAKE these medications    ANTIVERT PO Take by mouth as needed.   aspirin 81 MG tablet Take 81 mg by mouth daily.   Calcium 500 MG tablet Take 500 mg by mouth in the morning and at bedtime.   cholecalciferol 25 MCG (1000 UNIT) tablet Commonly known as: VITAMIN D3 Take 1,000 Units by mouth daily.   levothyroxine 88 MCG tablet Commonly known as: SYNTHROID Take 1 tablet (88 mcg total) by mouth daily before breakfast. Empty stomach    magnesium chloride 64 MG Tbec SR tablet Commonly known as: SLOW-MAG Take 1 tablet by mouth daily.   MOTRIN PO Take by mouth as needed.   MULTIVITAMIN ADULT PO Take 1 tablet by mouth daily.   omeprazole 40 MG capsule Commonly known as: PRILOSEC Take 1 capsule (40 mg total) by mouth daily. Wait 3-4 hours after thyroid med. Take 30 minutes before food lunch/dinner   PARoxetine 20 MG tablet Commonly known as: PAXIL Take 1 tablet (20 mg total) by mouth daily.   polyethylene glycol powder 17 GM/SCOOP powder Commonly known as: GLYCOLAX/MIRALAX Take 17 g by mouth daily as needed.   rosuvastatin 40 MG tablet Commonly known as: CRESTOR Take 1 tablet (40 mg total) by mouth daily.   valACYclovir 500 MG tablet Commonly known as: VALTREX TAKE 1 TAB DAILY AS PREVENTIVE OR TWICE DAILY FOR 3 DAYS AS NEEDED FOR SYMPTOM FLARE   Vitamin C 500 MG Chew Chew 1 tablet by mouth daily.        Signature:  Chesley Mires, MD [place] Pager - [phone] 05/06/2022, 11:41 AM

## 2022-05-10 ENCOUNTER — Ambulatory Visit (HOSPITAL_BASED_OUTPATIENT_CLINIC_OR_DEPARTMENT_OTHER): Payer: Medicare Other | Admitting: Pulmonary Disease

## 2022-05-12 DIAGNOSIS — Z23 Encounter for immunization: Secondary | ICD-10-CM | POA: Diagnosis not present

## 2022-05-19 ENCOUNTER — Ambulatory Visit
Admission: EM | Admit: 2022-05-19 | Discharge: 2022-05-19 | Disposition: A | Discharge status: Home / Self Care | Payer: Medicare Other | Attending: Family Medicine | Admitting: Family Medicine

## 2022-05-19 ENCOUNTER — Ambulatory Visit (INDEPENDENT_AMBULATORY_CARE_PROVIDER_SITE_OTHER): Payer: Medicare Other

## 2022-05-19 ENCOUNTER — Ambulatory Visit: Payer: Medicare Other

## 2022-05-19 DIAGNOSIS — R079 Chest pain, unspecified: Secondary | ICD-10-CM | POA: Insufficient documentation

## 2022-05-19 DIAGNOSIS — B9789 Other viral agents as the cause of diseases classified elsewhere: Secondary | ICD-10-CM | POA: Diagnosis not present

## 2022-05-19 DIAGNOSIS — J988 Other specified respiratory disorders: Secondary | ICD-10-CM | POA: Insufficient documentation

## 2022-05-19 DIAGNOSIS — R0781 Pleurodynia: Secondary | ICD-10-CM | POA: Diagnosis not present

## 2022-05-19 DIAGNOSIS — R059 Cough, unspecified: Secondary | ICD-10-CM | POA: Insufficient documentation

## 2022-05-19 DIAGNOSIS — Z1152 Encounter for screening for COVID-19: Secondary | ICD-10-CM | POA: Insufficient documentation

## 2022-05-19 LAB — RESP PANEL BY RT-PCR (RSV, FLU A&B, COVID)  RVPGX2
Influenza A by PCR: NEGATIVE
Influenza B by PCR: NEGATIVE
Resp Syncytial Virus by PCR: NEGATIVE
SARS Coronavirus 2 by RT PCR: NEGATIVE

## 2022-05-19 MED ORDER — PROMETHAZINE-DM 6.25-15 MG/5ML PO SYRP: 2.5000 mL | ORAL_SOLUTION | Freq: Four times a day (QID) | ORAL | 0 refills | Status: DC | PRN

## 2022-05-19 NOTE — ED Triage Notes (Addendum)
Pt states she developed a cold x 1 week ago today, pt states having pain when coughing. Pt also reports x2 negative home covid tests this week.

## 2022-05-19 NOTE — Discharge Instructions (Addendum)
Your chest x-ray did not show a rib fracture or pneumonia.  No cause for your pain was identified.  If you continue to have this pain, go to the emergency department for a heart evaluation.    I sent a cough syrup to your pharmacy to help control your cough.  Stop by and pick it up.  I will call you if your RSV test is positive.

## 2022-05-19 NOTE — ED Provider Notes (Signed)
[facility]    CSN: [id number] Arrival date & time: 05/19/22  1093      History   Chief Complaint No chief complaint on file.   HPI ALETHEIA TANGREDI is a 71 y.o. female.   HPI   Deshawn presents for non-productive cough and nasal congestion for the past week.  About a week ago, she got her flu vaccine. A couple days ago she started having bodyaches and chills.  Yesterday, she began having chest pain with deep breathing that radiated from the front to the back..  She has taken 2 negative COVID test this week.  Denies any fever, vomiting, shortness of breath, diarrhea, belly pain and sore throat.  Chest pain only occurs with deep breathing.      Past Medical History:  Diagnosis Date   Allergy    Anti-cardiolipin antibody positive    Dr. Jefm Bryant    Anxiety    Arthritis    BRCA negative 2016   MyRisk neg    Depression    Family history of ovarian cancer 2016   MyRisk neg; affected sister is BRCA neg   GERD (gastroesophageal reflux disease)    Herpes simplex    Hyperlipidemia    Hypothyroidism    Osteopenia    DEXA 09/02/13    Rosacea    Ruptured disc, cervical    Sleep apnea    no CPAP   Sleep apnea    Vertigo    per pt cervicogenic Physical therapy helped    Vitamin D deficiency    Wears dentures    full upper    Patient Active Problem List   Diagnosis Date Noted   OSA (obstructive sleep apnea) 09/25/2021   Herpes simplex vulvovaginitis 06/25/2021   Vaginal dryness, menopausal 06/25/2021   Precordial pain 10/13/2020   Dyspnea on exertion 10/13/2020   Lumbar radiculopathy 10/05/2020   Aortic atherosclerosis ([place]) 10/05/2020   Proctitis 10/05/2020   Colitis 10/05/2020   Malrotation colon 10/05/2020   Encounter for screening colonoscopy    Benign neoplasm of ascending colon    Near syncope 07/07/2016   Vertigo 07/07/2016   Acute bronchitis 06/13/2016   Osteopenia 09/05/2014   Vitamin D deficiency 09/05/2014   Hypothyroidism 09/05/2014    Chronic neck and back pain 09/05/2014   Hyperlipidemia 09/05/2014   Rosacea 09/05/2014   Allergic rhinitis 09/05/2014   Arthritis 03/14/2014   Depression, major, recurrent, in complete remission ([place]) 08/27/2007    Past Surgical History:  Procedure Laterality Date   CERVICAL BIOPSY  W/ LOOP ELECTRODE EXCISION     COLONOSCOPY     COLONOSCOPY WITH PROPOFOL N/A 03/30/2018   Procedure: COLONOSCOPY WITH Biopsies;  Surgeon: [name], MD;  Location: [place];  Service: Endoscopy;  Laterality: N/A;   DILATION AND CURETTAGE OF UTERUS  30 years ago   DRUG INDUCED ENDOSCOPY N/A 09/25/2021   Procedure: DRUG INDUCED ENDOSCOPY;  Surgeon: Melida Quitter, MD;  Location: [place];  Service: ENT;  Laterality: N/A;   EYE SURGERY     b/l cataract    IMPLANTATION OF HYPOGLOSSAL NERVE STIMULATOR Right 02/26/2022   Procedure: IMPLANTATION OF HYPOGLOSSAL NERVE STIMULATOR;  Surgeon: Melida Quitter, MD;  Location: [place];  Service: ENT;  Laterality: Right;   POLYPECTOMY N/A 03/30/2018   Procedure: POLYPECTOMY;  Surgeon: [name], MD;  Location: [place];  Service: Endoscopy;  Laterality: N/A;    OB History     Gravida  2   Para  2   Term  2   Preterm      AB      Living  2      SAB      IAB      Ectopic      Multiple      Live Births  2            Home Medications    Prior to Admission medications   Medication Sig Start Date End Date Taking? Authorizing Provider  promethazine-dextromethorphan (PROMETHAZINE-DM) 6.25-15 MG/5ML syrup Take 2.5-5 mLs by mouth 4 (four) times daily as needed. 05/19/22  Yes Kendel Bessey, DO  Ascorbic Acid (VITAMIN C) 500 MG CHEW Chew 1 tablet by mouth daily. 06/04/19   [provider]  aspirin 81 MG tablet Take 81 mg by mouth daily.    [provider]  Calcium 500 MG tablet Take 500 mg by mouth in the morning and at bedtime. 09/20/19   [provider]   cholecalciferol (VITAMIN D3) 25 MCG (1000 UNIT) tablet Take 1,000 Units by mouth daily.    [provider]  Ibuprofen (MOTRIN PO) Take by mouth as needed.    [provider]  levothyroxine (SYNTHROID) 88 MCG tablet Take 1 tablet (88 mcg total) by mouth daily before breakfast. Empty stomach 09/25/21   McLean-Scocuzza, Nino Glow, MD  magnesium chloride (SLOW-MAG) 64 MG TBEC SR tablet Take 1 tablet by mouth daily.    [provider]  Meclizine HCl (ANTIVERT PO) Take by mouth as needed.    [provider]  Multiple Vitamins-Minerals (MULTIVITAMIN ADULT PO) Take 1 tablet by mouth daily. 06/03/18   [provider]  omeprazole (PRILOSEC) 40 MG capsule Take 1 capsule (40 mg total) by mouth daily. Wait 3-4 hours after thyroid med. Take 30 minutes before food lunch/dinner 09/25/21   McLean-Scocuzza, Nino Glow, MD  PARoxetine (PAXIL) 20 MG tablet Take 1 tablet (20 mg total) by mouth daily. 09/25/21   McLean-Scocuzza, Nino Glow, MD  polyethylene glycol powder (GLYCOLAX/MIRALAX) 17 GM/SCOOP powder Take 17 g by mouth daily as needed. 09/25/21   McLean-Scocuzza, Nino Glow, MD  rosuvastatin (CRESTOR) 40 MG tablet Take 1 tablet (40 mg total) by mouth daily. 09/25/21 09/20/22  McLean-Scocuzza, Nino Glow, MD  valACYclovir (VALTREX) 500 MG tablet TAKE 1 TAB DAILY AS PREVENTIVE OR TWICE DAILY FOR 3 DAYS AS NEEDED FOR SYMPTOM FLARE 2/99/37   Copland, Deirdre Evener, PA-C    Family History Family History  Problem Relation Age of Onset   Cancer Mother        kidney met to lungs    Diabetes Mother    Thyroid disease Mother    Heart disease Father    Hypertension Father    Stroke Father    Heart attack Father 99   Thyroid disease Sister    Ovarian cancer Sister 54       BRCA neg   Colon cancer Maternal Aunt 49   Diabetes Daughter        type 1    Breast cancer Neg Hx     Social History Social History   Tobacco Use   Smoking status: Never   Smokeless tobacco: Never  Vaping Use    Vaping Use: Never used  Substance Use Topics   Alcohol use: No    Alcohol/week: 0.0 standard drinks of alcohol   Drug use: No     Allergies   Penicillin v potassium, Penicillins, and Tetanus toxoid   Review of  Systems Review of Systems: negative unless otherwise stated in HPI.      Physical Exam Triage Vital Signs ED Triage Vitals  Enc Vitals Group     BP 05/19/22 1102 118/73     Pulse Rate 05/19/22 1102 78     Resp --      Temp 05/19/22 1102 97.9 F (36.6 C)     Temp Source 05/19/22 1102 Oral     SpO2 05/19/22 1102 98 %     Weight 05/19/22 1102 115 lb (52.2 kg)     Height 05/19/22 1102 _0  (1.6 m)     Head Circumference --      Peak Flow --      Pain Score 05/19/22 1101 6     Pain Loc --      Pain Edu? --      Excl. in [place]? --    No data found.  Updated Vital Signs BP 118/73 (BP Location: Right Arm)   Pulse 78   Temp 97.9 F (36.6 C) (Oral)   Ht _1  (1.6 m)   Wt 52.2 kg   SpO2 98%   BMI 20.37 kg/m   Visual Acuity Right Eye Distance:   Left Eye Distance:   Bilateral Distance:    Right Eye Near:   Left Eye Near:    Bilateral Near:     Physical Exam GEN:     alert, non-toxic appearing female in no distress    HENT:  mucus membranes moist, clear nasal discharge EYES:   no scleral injection or discharge NECK:  ROM at baseline  RESP:  no increased work of breathing, clear to auscultation bilaterally CVS:   regular rate and rhythm CHEST WALL: Nontender to palpation, no deformity, no overlying skin changes Skin:   warm and dry    UC Treatments / Results  Labs (all labs ordered are listed, but only abnormal results are displayed) Labs Reviewed  RESP PANEL BY RT-PCR (RSV, FLU A&B, COVID)  RVPGX2    EKG   Radiology DG Ribs Unilateral W/Chest Left  Result Date: 05/19/2022 CLINICAL DATA:  Pain.  Cough. EXAM: LEFT RIBS AND CHEST - 3+ VIEW COMPARISON:  Chest x-ray February 26, 2022 FINDINGS: A generator/stimulator device projects over the  right chest with the lead extending into the right neck. The heart, hila, mediastinum, lungs, and pleura are unremarkable. No pneumothorax. No fractures are identified. IMPRESSION: No cause for the patient's cough identified. No fractures identified. Electronically Signed   By: Dorise Bullion III M.D.   On: 05/19/2022 11:27    Procedures Procedures (including critical care time)  Medications Ordered in UC Medications - No data to display  Initial Impression / Assessment and Plan / UC Course  I have reviewed the triage vital signs and the nursing notes.  Pertinent labs & imaging results that were available during my care of the patient were reviewed by me and considered in my medical decision making (see chart for details).       Pt is a 71 y.o. female who presents for 1 week of cough and nasal congestion. Keshana is afebrile here without recent antipyretics. Satting well on room air. Overall pt is non-toxic appearing, well hydrated, without respiratory distress. Pulmonary exam is unremarkable.  Reports acute pleurisy and posterior rib pain on the left.  Patient requesting RSV testing as she has been around her grandchildren and wants to be sure that she does not get them sick.  RSV COVID and influenza are  all negative. Chest xray personally reviewed by me without focal pneumonia, pleural effusion, cardiomegaly or pneumothorax.  There was no rib fractures noted by radiology.  History consistent with viral respiratory illness. Discussed symptomatic treatment.  Explained lack of efficacy of antibiotics in viral disease.  Typical duration of symptoms discussed.  Treat cough with Promethazine DM.  Precautions with this medication were used as she is elderly.  Return and ED precautions given and voiced understanding. Discussed MDM, treatment plan and plan for follow-up with patient who agrees with plan.     Final Clinical Impressions(s) / UC Diagnoses   Final diagnoses:  Viral respiratory  illness  Rib pain on left side     Discharge Instructions      Your chest x-ray did not show a rib fracture or pneumonia.  No cause for your pain was identified.  If you continue to have this pain, go to the emergency department for a heart evaluation.    I sent a cough syrup to your pharmacy to help control your cough.  Stop by and pick it up.  I will call you if your RSV test is positive.      ED Prescriptions     Medication Sig Dispense Auth. Provider   promethazine-dextromethorphan (PROMETHAZINE-DM) 6.25-15 MG/5ML syrup Take 2.5-5 mLs by mouth 4 (four) times daily as needed. 118 mL Lyndee Hensen, DO      PDMP not reviewed this encounter.   Lyndee Hensen, DO 05/19/22 1329

## 2022-05-30 ENCOUNTER — Telehealth: Payer: Medicare Other | Admitting: Physician Assistant

## 2022-05-30 DIAGNOSIS — J019 Acute sinusitis, unspecified: Secondary | ICD-10-CM | POA: Diagnosis not present

## 2022-05-30 DIAGNOSIS — B9689 Other specified bacterial agents as the cause of diseases classified elsewhere: Secondary | ICD-10-CM | POA: Diagnosis not present

## 2022-05-30 MED ORDER — DOXYCYCLINE HYCLATE 100 MG PO TABS: 100.0000 mg | ORAL_TABLET | Freq: Two times a day (BID) | ORAL | 0 refills | Status: DC

## 2022-05-30 NOTE — Progress Notes (Signed)
E-Visit for Sinus Problems  We are sorry that you are not feeling well.  Here is how we plan to help!  Based on what you have shared with me it looks like you have sinusitis.  Sinusitis is inflammation and infection in the sinus cavities of the head.  Based on your presentation I believe you most likely have Acute Bacterial Sinusitis.  This is an infection caused by bacteria and is treated with antibiotics. I have prescribed Doxycycline 100mg by mouth twice a day for 10 days. You may use an oral decongestant such as Mucinex D or if you have glaucoma or high blood pressure use plain Mucinex. Saline nasal spray help and can safely be used as often as needed for congestion.  If you develop worsening sinus pain, fever or notice severe headache and vision changes, or if symptoms are not better after completion of antibiotic, please schedule an appointment with a health care provider.    Sinus infections are not as easily transmitted as other respiratory infection, however we still recommend that you avoid close contact with loved ones, especially the very young and elderly.  Remember to wash your hands thoroughly throughout the day as this is the number one way to prevent the spread of infection!  Home Care: Only take medications as instructed by your medical team. Complete the entire course of an antibiotic. Do not take these medications with alcohol. A steam or ultrasonic humidifier can help congestion.  You can place a towel over your head and breathe in the steam from hot water coming from a faucet. Avoid close contacts especially the very young and the elderly. Cover your mouth when you cough or sneeze. Always remember to wash your hands.  Get Help Right Away If: You develop worsening fever or sinus pain. You develop a severe head ache or visual changes. Your symptoms persist after you have completed your treatment plan.  Make sure you Understand these instructions. Will watch your  condition. Will get help right away if you are not doing well or get worse.  Thank you for choosing an e-visit.  Your e-visit answers were reviewed by a board certified advanced clinical practitioner to complete your personal care plan. Depending upon the condition, your plan could have included both over the counter or prescription medications.  Please review your pharmacy choice. Make sure the pharmacy is open so you can pick up prescription now. If there is a problem, you may contact your provider through MyChart messaging and have the prescription routed to another pharmacy.  Your safety is important to us. If you have drug allergies check your prescription carefully.   For the next 24 hours you can use MyChart to ask questions about today's visit, request a non-urgent call back, or ask for a work or school excuse. You will get an email in the next two days asking about your experience. I hope that your e-visit has been valuable and will speed your recovery.  I have spent 5 minutes in review of e-visit questionnaire, review and updating patient chart, medical decision making and response to patient.   Boykin Baetz M Mittie Knittel, PA-C  

## 2022-06-03 HISTORY — PX: SKIN BIOPSY: SHX1

## 2022-06-05 ENCOUNTER — Encounter: Payer: Self-pay | Admitting: Adult Health

## 2022-06-17 ENCOUNTER — Ambulatory Visit: Payer: Medicare Other | Admitting: Adult Health

## 2022-06-21 IMAGING — MG MM DIGITAL SCREENING BILAT W/ TOMO AND CAD
6 of 10 series · 6 of 30 positions shown · non-contrast
Comparison: Previous exam(s).

CLINICAL DATA: Screening.

EXAM:
DIGITAL SCREENING BILATERAL MAMMOGRAM WITH TOMOSYNTHESIS AND CAD
TECHNIQUE: Bilateral screening digital craniocaudal and mediolateral oblique
mammograms were obtained. Bilateral screening digital breast
tomosynthesis was performed. The images were evaluated with
computer-aided detection.

[R MLO synth-2D]
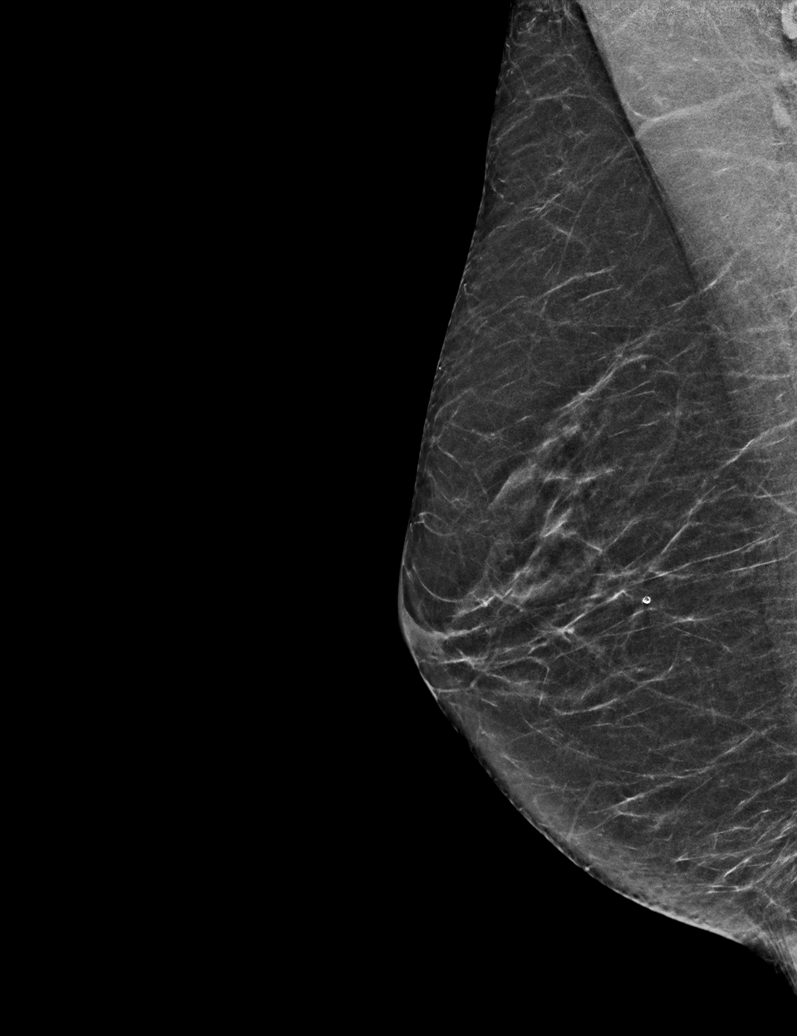

[L CC synth-2D]
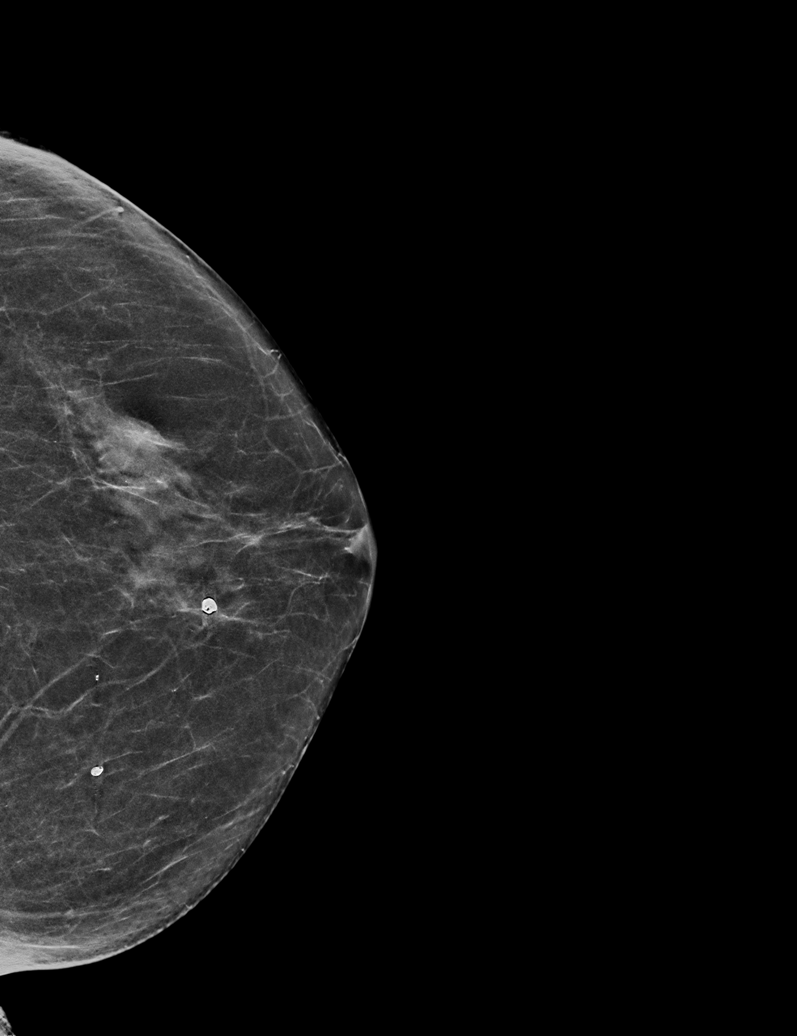

[R CC synth-2D (1 of 2)]
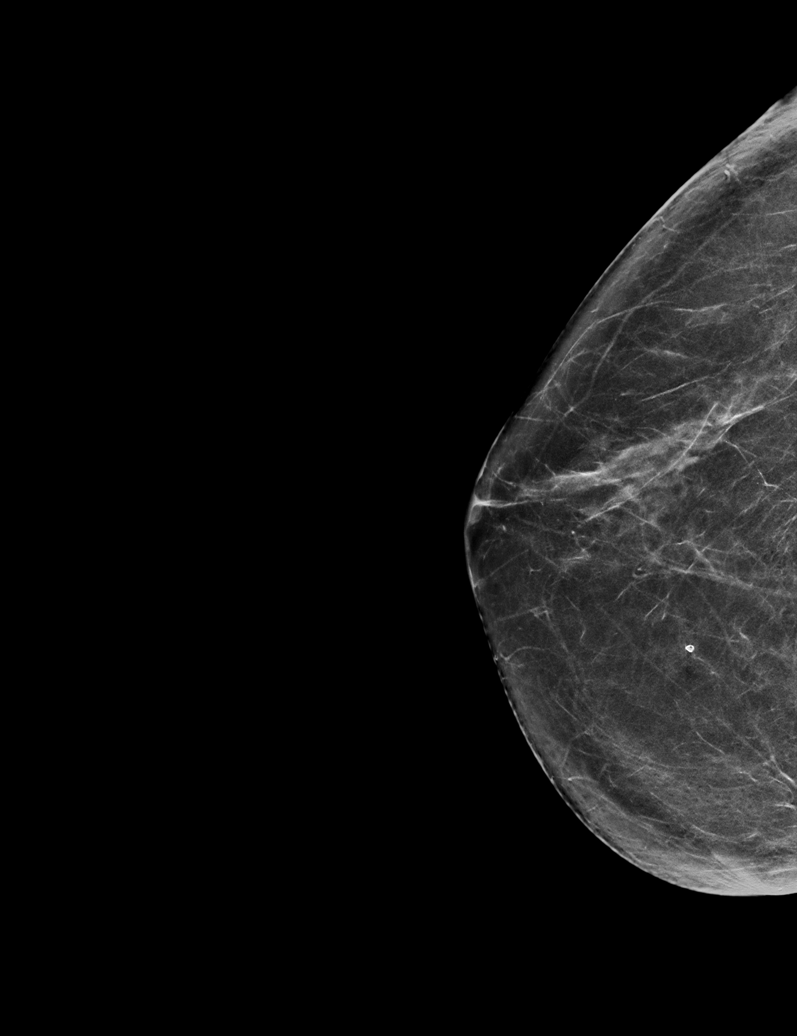

[R CC synth-2D (2 of 2)]
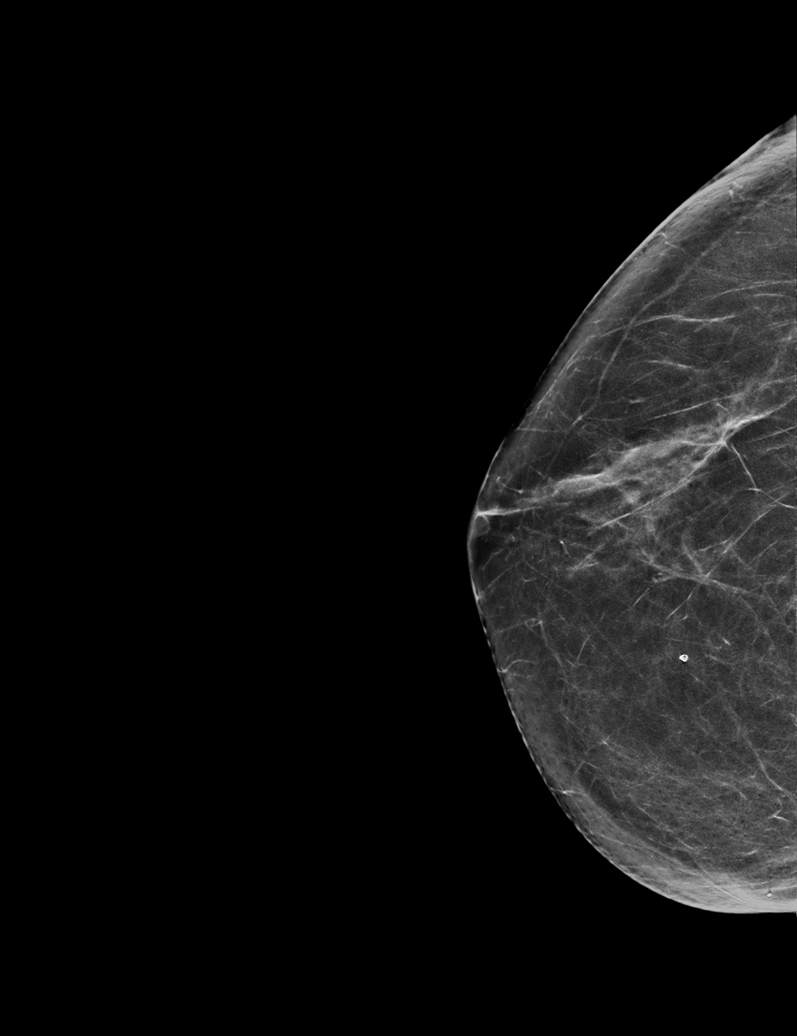

[L MLO synth-2D]
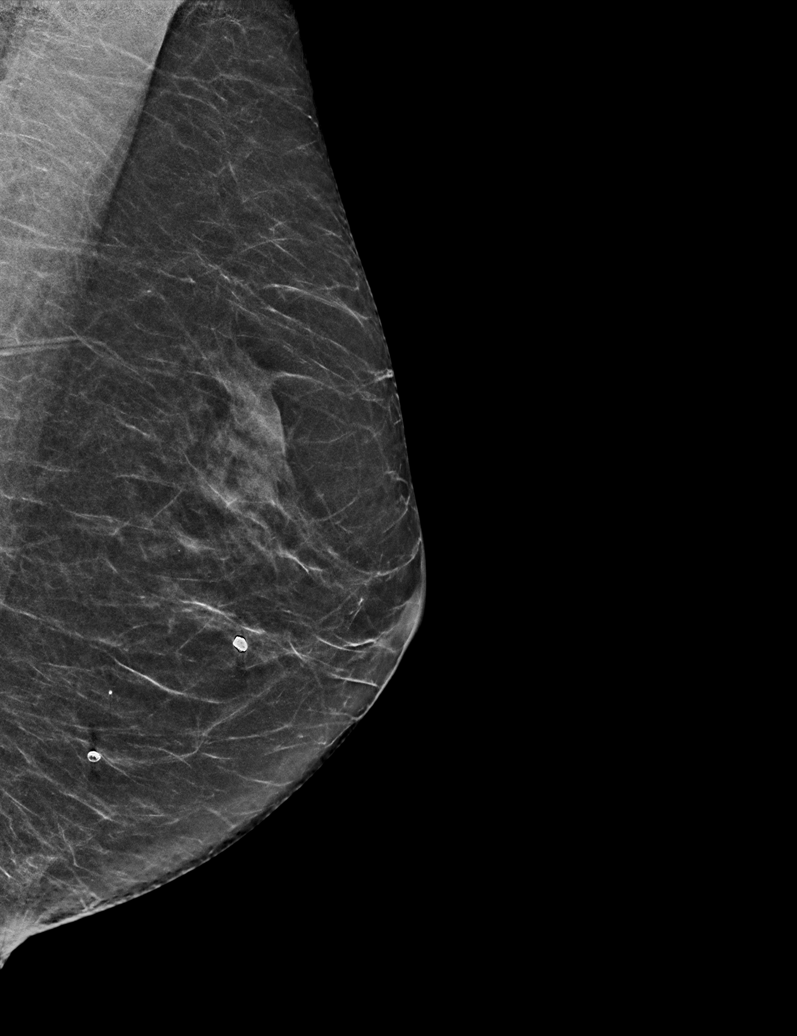

[R CC tomo · tomo slice 33/64.0]
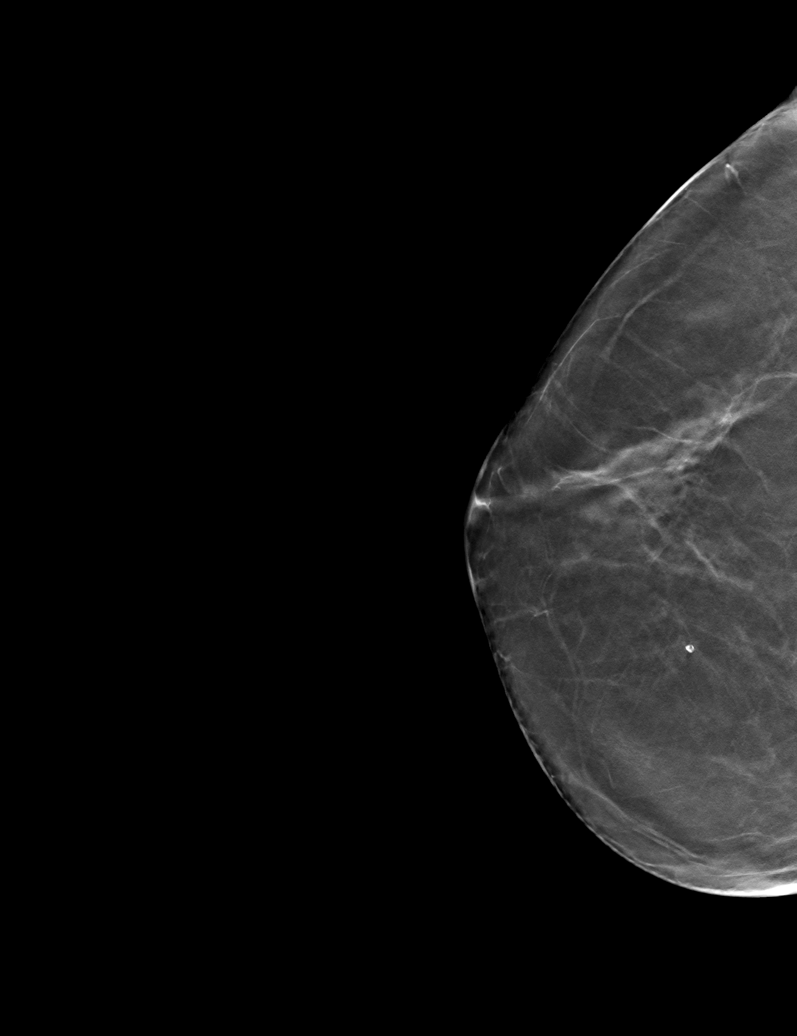

[6 of 30 positions shown; findings below may reference images not displayed]

ACR Breast Density Category b: There are scattered areas of
fibroglandular density.
FINDINGS: There are no findings suspicious for malignancy. The images were
evaluated with computer-aided detection.
IMPRESSION: No mammographic evidence of malignancy. A result letter of this
screening mammogram will be mailed directly to the patient.

RECOMMENDATION:
Screening mammogram in one year. (Code:[id number])

BI-RADS CATEGORY  1: Negative.

## 2022-06-24 ENCOUNTER — Encounter: Payer: Self-pay | Admitting: Adult Health

## 2022-06-24 ENCOUNTER — Ambulatory Visit (INDEPENDENT_AMBULATORY_CARE_PROVIDER_SITE_OTHER): Payer: Medicare Other | Admitting: Adult Health

## 2022-06-24 VITALS — BP 104/68 | HR 67 | Temp 98.4°F | Ht 63.0 in | Wt 122.8 lb

## 2022-06-24 DIAGNOSIS — G4733 Obstructive sleep apnea (adult) (pediatric): Secondary | ICD-10-CM

## 2022-06-24 NOTE — Assessment & Plan Note (Signed)
Patient has excellent compliance on her inspire device.  She has reported perceived benefit.  And feels comfortable with no known issues.  Will can continue on current settings.  She is to go for her inspire titration study as planned later this month.  Plan  Patient Instructions  Use INSPIRE device all night while sleeping .  INSPIRE titration study this month as planned  Call for any questions or issues with INSPIRE  Follow up with Dr. Halford Chessman  or Elliot Meldrum NP in 4 weeks and As needed

## 2022-06-24 NOTE — Patient Instructions (Signed)
Use INSPIRE device all night while sleeping .  INSPIRE titration study this month as planned  Call for any questions or issues with INSPIRE  Follow up with Dr. Halford Chessman  or Albina Gosney NP in 4 weeks and As needed

## 2022-06-24 NOTE — Progress Notes (Signed)
$'@Patient'R$  ID: Sandra Horton, female    DOB: 09-22-1950, 72 y.o.   MRN: [id number]  Chief Complaint  Patient presents with   Follow-up    Referring provider: No ref. provider found  HPI: 72 yo female followed for OSA intolerant to CPAP  Underwent INSPIRE device placement 02/2022 (Dr. Redmond Baseman)   TEST/EVENTS :  S/p hypoglossal nerve stimulator placement 02/26/22    PFT 01/15/21 >> FEV1 2.28 (106%), FEV1% 83, TLC 4.90 (90%), DLCO 94%   Sleep Tests:  HST 02/27/21 >> AHI 23.4, SpO2 low 81%. Auto CPAP 06/11/21 to 09/08/21 >> used on 49 of 90 nights with average 5 hrs 45 min.  Average AHI 2.8 with mean CPAP 8 and 95 th percentile CPAP 11 cm H2O   Cardiac Tests:  Echo 11/10/20 >> EF 55 to 60%, grade 1 DD, mild MR  06/24/2022 Follow up ; OSA  Patient returns for a 6-week follow-up.  Patient was diagnosed with sleep apnea in September 2022.  She was started on CPAP but was unable to tolerate.  She was referred to ENT for the inspire device.  Hypoglossal nerve stimulator was placed on February 26, 2022 by Dr. Redmond Baseman.  Patient says she has been doing well with her hands prior to device.  She cannot believe how much better that she feels.  She is very pleased with the device.  She says that she uses it all night long and does not cause any disruption in her sleep.  She wakes up feeling refreshed and is not sleepy during the daytime.  Download shows 100% compliance.  Daily average usage at 8 hours and 10 minutes.  Patient has titrated up to 1.2 V.  She says this is comfortable for her.  She can even get up to go the bathroom does not even put it on Pause. She goes right back to sleep after going to the bathroom.  She says her remote is working well.  She feels very comfortable.  Today in the office stimulation test appears normal with midline tongue protrusion .  She reports feels comfortable.  Waveform was reviewed for 3 minutes.  Patient has upcoming inspire sleep titration study on January 31. Inspire team is  here today for her visit .   Allergies  Allergen Reactions   Penicillin V Potassium Anaphylaxis   Penicillins Anaphylaxis   Tetanus Toxoid Nausea And Vomiting    Immunization History  Administered Date(s) Administered   Hepatitis B, adult 05/01/2017, 05/29/2017, 10/29/2017   Influenza, High Dose Seasonal PF 04/02/2018, 03/27/2021   Influenza, Seasonal, Injecte, Preservative Fre 03/03/2012   Influenza,inj,Quad PF,6+ Mos 06/15/2019   Influenza-Unspecified 03/23/2020, 05/12/2022   Pneumococcal Conjugate-13 02/22/2016, 04/21/2017   Pneumococcal Polysaccharide-23 04/22/2018   Zoster Recombinat (Shingrix) 12/06/2019, 02/14/2020   Zoster, Live 07/30/2012    Past Medical History:  Diagnosis Date   Allergy    Anti-cardiolipin antibody positive    Dr. Jefm Bryant    Anxiety    Arthritis    BRCA negative 2016   MyRisk neg    Depression    Family history of ovarian cancer 2016   MyRisk neg; affected sister is BRCA neg   GERD (gastroesophageal reflux disease)    Herpes simplex    Hyperlipidemia    Hypothyroidism    Osteopenia    DEXA 09/02/13    Rosacea    Ruptured disc, cervical    Sleep apnea    no CPAP   Sleep apnea    Vertigo  per pt cervicogenic Physical therapy helped    Vitamin D deficiency    Wears dentures    full upper    Tobacco History: Social History   Tobacco Use  Smoking Status Never  Smokeless Tobacco Never   Counseling given: Not Answered   Outpatient Medications Prior to Visit  Medication Sig Dispense Refill   Ascorbic Acid (VITAMIN C) 500 MG CHEW Chew 1 tablet by mouth daily.     aspirin 81 MG tablet Take 81 mg by mouth daily.     Calcium 500 MG tablet Take 500 mg by mouth in the morning and at bedtime.     cholecalciferol (VITAMIN D3) 25 MCG (1000 UNIT) tablet Take 1,000 Units by mouth daily.     doxycycline (VIBRA-TABS) 100 MG tablet Take 1 tablet (100 mg total) by mouth 2 (two) times daily. 20 tablet 0   Ibuprofen (MOTRIN PO) Take by mouth as  needed.     levothyroxine (SYNTHROID) 88 MCG tablet Take 1 tablet (88 mcg total) by mouth daily before breakfast. Empty stomach 90 tablet 3   magnesium chloride (SLOW-MAG) 64 MG TBEC SR tablet Take 1 tablet by mouth daily.     Meclizine HCl (ANTIVERT PO) Take by mouth as needed.     Multiple Vitamins-Minerals (MULTIVITAMIN ADULT PO) Take 1 tablet by mouth daily.     omeprazole (PRILOSEC) 40 MG capsule Take 1 capsule (40 mg total) by mouth daily. Wait 3-4 hours after thyroid med. Take 30 minutes before food lunch/dinner 90 capsule 3   PARoxetine (PAXIL) 20 MG tablet Take 1 tablet (20 mg total) by mouth daily. 90 tablet 3   polyethylene glycol powder (GLYCOLAX/MIRALAX) 17 GM/SCOOP powder Take 17 g by mouth daily as needed. 850 g 11   promethazine-dextromethorphan (PROMETHAZINE-DM) 6.25-15 MG/5ML syrup Take 2.5-5 mLs by mouth 4 (four) times daily as needed. 118 mL 0   rosuvastatin (CRESTOR) 40 MG tablet Take 1 tablet (40 mg total) by mouth daily. 90 tablet 3   valACYclovir (VALTREX) 500 MG tablet TAKE 1 TAB DAILY AS PREVENTIVE OR TWICE DAILY FOR 3 DAYS AS NEEDED FOR SYMPTOM FLARE 180 tablet 1   No facility-administered medications prior to visit.     Review of Systems:   Constitutional:   No  weight loss, night sweats,  Fevers, chills, fatigue, or  lassitude.  HEENT:   No headaches,  Difficulty swallowing,  Tooth/dental problems, or  Sore throat,                No sneezing, itching, ear ache, nasal congestion, post nasal drip,   CV:  No chest pain,  Orthopnea, PND, swelling in lower extremities, anasarca, dizziness, palpitations, syncope.   GI  No heartburn, indigestion, abdominal pain, nausea, vomiting, diarrhea, change in bowel habits, loss of appetite, bloody stools.   Resp: No shortness of breath with exertion or at rest.  No excess mucus, no productive cough,  No non-productive cough,  No coughing up of blood.  No change in color of mucus.  No wheezing.  No chest wall deformity  Skin:  no rash or lesions.  GU: no dysuria, change in color of urine, no urgency or frequency.  No flank pain, no hematuria   MS:  No joint pain or swelling.  No decreased range of motion.  No back pain.    Physical Exam  BP 104/68   Pulse 67   Temp 98.4 F (36.9 C) (Oral)   Ht '5\' 3"'$  (1.6 m)   Wt  122 lb 12.8 oz (55.7 kg)   SpO2 100%   BMI 21.75 kg/m   GEN: A/Ox3; pleasant , NAD, well nourished    HEENT:  /AT,  NOSE-clear, THROAT-clear, no lesions, no postnasal drip or exudate noted.   NECK:  Supple w/ fair ROM; no JVD; normal carotid impulses w/o bruits; no thyromegaly or nodules palpated; no lymphadenopathy.    RESP  Clear  P & A; w/o, wheezes/ rales/ or rhonchi. no accessory muscle use, no dullness to percussion  CARD:  RRR, no m/r/g, no peripheral edema, pulses intact, no cyanosis or clubbing.  GI:   Soft & nt; nml bowel sounds; no organomegaly or masses detected.   Musco: Warm bil, no deformities or joint swelling noted.   Neuro: alert, no focal deficits noted.    Skin: Warm, no lesions or rashes-right-sided chest wall device and submandibular incisions are well-healed    Lab Results:    BMET   BNP No results found for: "BNP"  ProBNP No results found for: "PROBNP"  Imaging: No results found.        No data to display          No results found for: "NITRICOXIDE"      Assessment & Plan:   OSA (obstructive sleep apnea) Patient has excellent compliance on her inspire device.  She has reported perceived benefit.  And feels comfortable with no known issues.  Will can continue on current settings.  She is to go for her inspire titration study as planned later this month.  Plan  Patient Instructions  Use INSPIRE device all night while sleeping .  INSPIRE titration study this month as planned  Call for any questions or issues with INSPIRE  Follow up with Dr. Halford Chessman  or Joshuan Bolander NP in 4 weeks and As needed        Rexene Edison, NP 06/24/2022

## 2022-06-25 ENCOUNTER — Telehealth: Payer: Self-pay

## 2022-06-25 ENCOUNTER — Encounter: Payer: Self-pay | Admitting: Nurse Practitioner

## 2022-06-25 ENCOUNTER — Ambulatory Visit: Payer: Medicare Other | Admitting: Nurse Practitioner

## 2022-06-25 VITALS — BP 100/62 | HR 65 | Temp 98.6°F | Ht 63.0 in | Wt 122.2 lb

## 2022-06-25 DIAGNOSIS — E785 Hyperlipidemia, unspecified: Secondary | ICD-10-CM

## 2022-06-25 DIAGNOSIS — E559 Vitamin D deficiency, unspecified: Secondary | ICD-10-CM

## 2022-06-25 DIAGNOSIS — E039 Hypothyroidism, unspecified: Secondary | ICD-10-CM

## 2022-06-25 DIAGNOSIS — Z1231 Encounter for screening mammogram for malignant neoplasm of breast: Secondary | ICD-10-CM

## 2022-06-25 DIAGNOSIS — K219 Gastro-esophageal reflux disease without esophagitis: Secondary | ICD-10-CM

## 2022-06-25 DIAGNOSIS — Z1211 Encounter for screening for malignant neoplasm of colon: Secondary | ICD-10-CM

## 2022-06-25 LAB — COMPREHENSIVE METABOLIC PANEL
ALT: 19 U/L (ref 0–35)
AST: 22 U/L (ref 0–37)
Albumin: 4.3 g/dL (ref 3.5–5.2)
Alkaline Phosphatase: 57 U/L (ref 39–117)
BUN: 9 mg/dL (ref 6–23)
CO2: 29 mEq/L (ref 19–32)
Calcium: 9.4 mg/dL (ref 8.4–10.5)
Chloride: 103 mEq/L (ref 96–112)
Creatinine, Ser: 0.85 mg/dL (ref 0.40–1.20)
GFR: 68.24 mL/min (ref >60.00)
Glucose, Bld: 81 mg/dL (ref 70–99)
Potassium: 3.9 mEq/L (ref 3.5–5.1)
Sodium: 141 mEq/L (ref 135–145)
Total Bilirubin: 0.6 mg/dL (ref 0.2–1.2)
Total Protein: 6.9 g/dL (ref 6.0–8.3)

## 2022-06-25 LAB — CBC WITH DIFFERENTIAL/PLATELET
Basophils Absolute: 0.1 10*3/uL (ref 0.0–0.1)
Basophils Relative: 1.1 % (ref 0.0–3.0)
Eosinophils Absolute: 0.1 10*3/uL (ref 0.0–0.7)
Eosinophils Relative: 1.5 % (ref 0.0–5.0)
HCT: 39.1 % (ref 36.0–46.0)
Hemoglobin: 12.8 g/dL (ref 12.0–15.0)
Lymphocytes Relative: 26.4 % (ref 12.0–46.0)
Lymphs Abs: 1.2 10*3/uL (ref 0.7–4.0)
MCHC: 32.8 g/dL (ref 30.0–36.0)
MCV: 91.7 fl (ref 78.0–100.0)
Monocytes Absolute: 0.3 10*3/uL (ref 0.1–1.0)
Monocytes Relative: 7 % (ref 3.0–12.0)
Neutro Abs: 3 10*3/uL (ref 1.4–7.7)
Neutrophils Relative %: 64 % (ref 43.0–77.0)
Platelets: 234 10*3/uL (ref 150.0–400.0)
RBC: 4.27 Mil/uL (ref 3.87–5.11)
RDW: 14.5 % (ref 11.5–15.5)
WBC: 4.7 10*3/uL (ref 4.0–10.5)

## 2022-06-25 LAB — LIPID PANEL
Cholesterol: 147 mg/dL (ref 0–200)
HDL: 69.8 mg/dL (ref >39.00)
LDL Cholesterol: 61 mg/dL (ref 0–99)
NonHDL: 76.76
Total CHOL/HDL Ratio: 2
Triglycerides: 78 mg/dL (ref 0.0–149.0)
VLDL: 15.6 mg/dL (ref 0.0–40.0)

## 2022-06-25 LAB — VITAMIN D 25 HYDROXY (VIT D DEFICIENCY, FRACTURES): VITD: 61.8 ng/mL (ref 30.00–100.00)

## 2022-06-25 LAB — TSH: TSH: 1.44 u[IU]/mL (ref 0.35–5.50)

## 2022-06-25 NOTE — Progress Notes (Signed)
Tomasita Morrow, NP-C Phone: [phone]  Sandra Horton is a 72 y.o. female who presents today for transfer of care. She has no complaints or new concerns today. She is doing well on all of her medications. She is followed by Pulmonology for OSA. She had an INSPIRE device placement due to being intolerant to CPAP, she reports doing great.   HYPOTHYROIDISM Disease Monitoring Weight changes: No  Skin Changes: No Palpitations: No Heat/Cold intolerance: No Medication Monitoring Compliance:  Levothyroxine 88 mcg   Last TSH:   Lab Results  Component Value Date   TSH 1.25 09/25/2021   GERD:   Reflux symptoms: No- well managed on medication   Abd pain: No   Blood in stool: No  Dysphagia: No   EGD: Never  Medication: Omeprazole 40 mg  HYPERLIPIDEMIA Symptoms Chest pain on exertion:  No   Leg claudication:   No Medications: Compliance- Crestor 40 mg Right upper quadrant pain- No  Muscle aches- No Lipid Panel     Component Value Date/Time   CHOL 150 09/25/2021 0843   CHOL 161 03/15/2021 1009   TRIG 63.0 09/25/2021 0843   HDL 75.60 09/25/2021 0843   HDL 67 03/15/2021 1009   CHOLHDL 2 09/25/2021 0843   VLDL 12.6 09/25/2021 0843   LDLCALC 62 09/25/2021 0843   LDLCALC 82 03/15/2021 1009   LABVLDL 12 03/15/2021 1009     Social History   Tobacco Use  Smoking Status Never  Smokeless Tobacco Never    Current Outpatient Medications on File Prior to Visit  Medication Sig Dispense Refill   Ascorbic Acid (VITAMIN C) 500 MG CHEW Chew 1 tablet by mouth daily.     aspirin 81 MG tablet Take 81 mg by mouth daily.     Calcium 500 MG tablet Take 500 mg by mouth in the morning and at bedtime.     cholecalciferol (VITAMIN D3) 25 MCG (1000 UNIT) tablet Take 1,000 Units by mouth daily.     Ibuprofen (MOTRIN PO) Take by mouth as needed.     levothyroxine (SYNTHROID) 88 MCG tablet Take 1 tablet (88 mcg total) by mouth daily before breakfast. Empty stomach 90 tablet 3   magnesium chloride  (SLOW-MAG) 64 MG TBEC SR tablet Take 1 tablet by mouth daily.     Meclizine HCl (ANTIVERT PO) Take by mouth as needed.     Multiple Vitamins-Minerals (MULTIVITAMIN ADULT PO) Take 1 tablet by mouth daily.     omeprazole (PRILOSEC) 40 MG capsule Take 1 capsule (40 mg total) by mouth daily. Wait 3-4 hours after thyroid med. Take 30 minutes before food lunch/dinner 90 capsule 3   PARoxetine (PAXIL) 20 MG tablet Take 1 tablet (20 mg total) by mouth daily. 90 tablet 3   polyethylene glycol powder (GLYCOLAX/MIRALAX) 17 GM/SCOOP powder Take 17 g by mouth daily as needed. 850 g 11   rosuvastatin (CRESTOR) 40 MG tablet Take 1 tablet (40 mg total) by mouth daily. 90 tablet 3   valACYclovir (VALTREX) 500 MG tablet TAKE 1 TAB DAILY AS PREVENTIVE OR TWICE DAILY FOR 3 DAYS AS NEEDED FOR SYMPTOM FLARE 180 tablet 1   No current facility-administered medications on file prior to visit.     ROS see history of present illness  Objective  Physical Exam Vitals:   06/25/22 0940  BP: 100/62  Pulse: 65  Temp: 98.6 F (37 C)  SpO2: 98%    BP Readings from Last 3 Encounters:  06/25/22 100/62  06/24/22 104/68  05/19/22 118/73  Wt Readings from Last 3 Encounters:  06/25/22 122 lb 3.2 oz (55.4 kg)  06/24/22 122 lb 12.8 oz (55.7 kg)  05/19/22 115 lb (52.2 kg)    Physical Exam Constitutional:      General: She is not in acute distress.    Appearance: Normal appearance.  HENT:     Head: Normocephalic.  Cardiovascular:     Rate and Rhythm: Normal rate and regular rhythm.     Heart sounds: Normal heart sounds.  Pulmonary:     Effort: Pulmonary effort is normal.     Breath sounds: Normal breath sounds.  Abdominal:     General: Abdomen is flat. Bowel sounds are normal.     Palpations: Abdomen is soft.     Tenderness: There is no abdominal tenderness.  Skin:    General: Skin is warm and dry.  Neurological:     Mental Status: She is alert.  Psychiatric:        Mood and Affect: Mood normal.         Behavior: Behavior normal.    Assessment/Plan: Please see individual problem list.  Hypothyroidism, unspecified type Assessment & Plan: Chronic. Stable on Levothyroxine 88 mcg daily. Continue. Will check TSH today.  Orders: -     TSH  Hyperlipidemia, unspecified hyperlipidemia type Assessment & Plan: Chronic. Stable on Crestor 40 mg daily. Continue. Follow up with Cardiology. Will check fasting lipids today.   Orders: -     CBC with Differential/Platelet -     Comprehensive metabolic panel -     Lipid panel  Gastroesophageal reflux disease without esophagitis Assessment & Plan: Chronic. Stable on Omeprazole 40 mg daily. Continue.   Vitamin D deficiency -     CBC with Differential/Platelet -     VITAMIN D 25 Hydroxy (Vit-D Deficiency, Fractures)  Screening mammogram for breast cancer -     3D Screening Mammogram, Left and Right; Future  Screen for colon cancer -     Ambulatory referral to Gastroenterology    Return in about 1 year (around 06/26/2023).   Tomasita Morrow, NP-C [place]

## 2022-06-25 NOTE — Telephone Encounter (Signed)
Contacted patient to schedule her colonoscopy.  She is not due to have it until after 03/31/23 with Dr. Allen Norris.  I've created a reminder to contact her when its time to schedule.  Referral will be closed until October.  Thanks,  [place], [place]

## 2022-06-25 NOTE — Assessment & Plan Note (Signed)
Chronic. Stable on Levothyroxine 88 mcg daily. Continue. Will check TSH today.

## 2022-06-25 NOTE — Assessment & Plan Note (Signed)
Chronic. Stable on Omeprazole 40 mg daily. Continue.

## 2022-06-25 NOTE — Assessment & Plan Note (Signed)
Chronic. Stable on Crestor 40 mg daily. Continue. Follow up with Cardiology. Will check fasting lipids today.

## 2022-06-28 NOTE — Progress Notes (Signed)
Reviewed and agree with assessment/plan.   Chesley Mires, MD [facility] Pulmonary/Critical Care 06/28/2022, 7:19 AM Pager:  [phone]

## 2022-07-01 ENCOUNTER — Encounter (HOSPITAL_BASED_OUTPATIENT_CLINIC_OR_DEPARTMENT_OTHER): Payer: Medicare Other | Admitting: Pulmonary Disease

## 2022-07-02 ENCOUNTER — Encounter: Payer: Medicare Other | Admitting: Family Medicine

## 2022-07-03 ENCOUNTER — Ambulatory Visit (HOSPITAL_BASED_OUTPATIENT_CLINIC_OR_DEPARTMENT_OTHER): Payer: Medicare Other | Attending: Pulmonary Disease | Admitting: Pulmonary Disease

## 2022-07-03 DIAGNOSIS — G4733 Obstructive sleep apnea (adult) (pediatric): Secondary | ICD-10-CM

## 2022-07-07 ENCOUNTER — Telehealth: Payer: Self-pay | Admitting: Pulmonary Disease

## 2022-07-07 DIAGNOSIS — G4733 Obstructive sleep apnea (adult) (pediatric): Secondary | ICD-10-CM | POA: Diagnosis not present

## 2022-07-07 NOTE — Telephone Encounter (Signed)
Inspire titration study 07/03/22 >> 1.3 V >> AHI 0   Please let [place] and Inspire team now that titration study completed and she did well at 1.3 V.  Please also let the patient know.

## 2022-07-07 NOTE — Procedures (Signed)
Patient Name: Sandra Horton, Sandra Horton Date: 07/03/2022 Gender: Female D.O.B: 02/25/1951 Age (years): 74 Referring Provider: Chesley Mires MD, ABSM Height (inches): 63 Interpreting Physician: Chesley Mires MD, ABSM Weight (lbs): 115 RPSGT: Laren Everts BMI: 20 MRN: [id number] Neck Size: 12.50  CLINICAL INFORMATION The patient is referred for a hypoglossal nerve stimulator titration to treat sleep apnea.  Date of HST 02/27/21: AHI 23.4, SpO2 low 81%  SLEEP STUDY TECHNIQUE As per the AASM Manual for the Scoring of Sleep and Associated Events v2.3 (April 2016) with a hypopnea requiring 4% desaturations.  The channels recorded and monitored were frontal, central and occipital EEG, electrooculogram (EOG), submentalis EMG (chin), nasal and oral airflow, thoracic and abdominal wall motion, anterior tibialis EMG, snore microphone, electrocardiogram, and pulse oximetry. Continuous positive airway pressure (CPAP) was initiated at the beginning of the study and titrated to treat sleep-disordered breathing.  MEDICATIONS Medications self-administered by patient taken the night of the study : N/A  TECHNICIAN COMMENTS Comments added by technician: NONE Comments added by scorer: N/A  RESPIRATORY PARAMETERS  Her incoming amplitude was 1.2 V.  The study was started with an amplitude of 1.0 V.  She had this increased to 1.3 V.  The AHI 0 with an SpO2 low of 93% at the optimal amplitude setting.  She slept exclusive in the supine position at the optimal amplitude setting.  SLEEP ARCHITECTURE The study was initiated at 9:31:43 PM and ended at 5:14:13 AM.  Sleep onset time was 25.7 minutes and the sleep efficiency was 84.1%. The total sleep time was 388.8 minutes.  The patient spent 7.3% of the night in stage N1 sleep, 65.5% in stage N2 sleep, 0.0% in stage N3 and 27.1% in REM.Stage REM latency was 254.0 minutes  Wake after sleep onset was 48.0. Alpha intrusion was absent. Supine sleep was  56.03%.  CARDIAC DATA The 2 lead EKG demonstrated sinus rhythm. The mean heart rate was 72.4 beats per minute. Other EKG findings include: None.  LEG MOVEMENT DATA The total Periodic Limb Movements of Sleep (PLMS) were 0. The PLMS index was 0.0. A PLMS index of <15 is considered normal in adults.  IMPRESSIONS - Successful hypoglossal nerve stimulator tiration study.  She did well with the amplitude set at 1.3 V. - No snoring was audible during this study. - No cardiac abnormalities were observed during this study. - Clinically significant periodic limb movements were not noted during this study. Arousals associated with PLMs were rare.  DIAGNOSIS - Obstructive Sleep Apnea (G47.33)  RECOMMENDATIONS - Set the hypoglossal nerve stimulator at 1.3 V. - Avoid alcohol, sedatives and other CNS depressants that may worsen sleep apnea and disrupt normal sleep architecture. - Sleep hygiene should be reviewed to assess factors that may improve sleep quality. - Weight management and regular exercise should be initiated or continued.  [Electronically signed] 07/07/2022 04:44 PM  Chesley Mires MD, [place], American Board of Sleep Medicine NPI: [id number]

## 2022-07-08 NOTE — Telephone Encounter (Signed)
ATC patient and left her a detailed vm (ok per dpr [phone]) letting her know she did well on titration study and we will forward a message to inspire team. Advised her to call back for questions. Will route to [facility].

## 2022-07-23 ENCOUNTER — Ambulatory Visit: Payer: Medicare Other | Admitting: Adult Health

## 2022-08-01 ENCOUNTER — Encounter: Payer: Self-pay | Admitting: Adult Health

## 2022-08-01 ENCOUNTER — Ambulatory Visit (INDEPENDENT_AMBULATORY_CARE_PROVIDER_SITE_OTHER): Payer: Medicare Other | Admitting: Adult Health

## 2022-08-01 VITALS — BP 108/66 | HR 84 | Temp 97.7°F | Ht 63.0 in | Wt 123.8 lb

## 2022-08-01 DIAGNOSIS — G4733 Obstructive sleep apnea (adult) (pediatric): Secondary | ICD-10-CM | POA: Diagnosis not present

## 2022-08-01 NOTE — Progress Notes (Signed)
$'@Patient'o$  ID: Sandra Horton, female    DOB: 04-Oct-1950, 72 y.o.   MRN: [id number]  Chief Complaint  Patient presents with   Follow-up    Referring provider: Tomasita Morrow, NP  HPI: 72 year old female followed for obstructive sleep apnea intolerant to CPAP.  Underwent inspire device placement September 2023 with Dr. Redmond Baseman  TEST/EVENTS :  S/p hypoglossal nerve stimulator placement 02/26/22    PFT 01/15/21 >> FEV1 2.28 (106%), FEV1% 83, TLC 4.90 (90%), DLCO 94%   Sleep Tests:  HST 02/27/21 >> AHI 23.4, SpO2 low 81%. Auto CPAP 06/11/21 to 09/08/21 >> used on 49 of 90 nights with average 5 hrs 45 min.  Average AHI 2.8 with mean CPAP 8 and 95 th percentile CPAP 11 cm H2O   Cardiac Tests:  Echo 11/10/20 >> EF 55 to 60%, grade 1 DD, mild MR  Inspire titration study 07/03/22 >> 1.3 V >> AHI 0    08/01/2022 Follow up : OSA /INSPIRE  Patient returns for a 4-week follow-up.  Patient has underlying sleep apnea and has been intolerant to CPAP.  She underwent an inspire implantation on February 26, 2022.  Patient says she continues to do very well with her inspire.  She is very pleased at how much better she feels.  She feels that she has more energy.  She underwent an inspire titration study on July 03, 2022 that showed optimal control at 1.3 V with a AHI at 0.   Inspire download shows excellent compliance with daily average usage at 8.5 hours.  Current level is at 1.3 V.  Patient says she has no tongue soreness.   Allergies  Allergen Reactions   Penicillin V Potassium Anaphylaxis   Penicillins Anaphylaxis   Tetanus Toxoid Nausea And Vomiting    Immunization History  Administered Date(s) Administered   Fluad Quad(high Dose 65+) 03/04/2022   Hepatitis B, ADULT 05/01/2017, 05/29/2017, 10/29/2017   Influenza, High Dose Seasonal PF 04/02/2018, 03/27/2021   Influenza, Seasonal, Injecte, Preservative Fre 03/03/2012   Influenza,inj,Quad PF,6+ Mos 06/15/2019   Influenza-Unspecified 03/23/2020,  05/12/2022   Pneumococcal Conjugate-13 02/22/2016, 04/21/2017   Pneumococcal Polysaccharide-23 04/22/2018   Zoster Recombinat (Shingrix) 12/06/2019, 02/14/2020   Zoster, Live 07/30/2012    Past Medical History:  Diagnosis Date   Allergy    Anti-cardiolipin antibody positive    Dr. Jefm Bryant    Anxiety    Arthritis    BRCA negative 2016   MyRisk neg    Depression    Family history of ovarian cancer 2016   MyRisk neg; affected sister is BRCA neg   GERD (gastroesophageal reflux disease)    Herpes simplex    Hyperlipidemia    Hypothyroidism    Osteopenia    DEXA 09/02/13    Rosacea    Ruptured disc, cervical    Sleep apnea    no CPAP   Sleep apnea    Vertigo    per pt cervicogenic Physical therapy helped    Vitamin D deficiency    Wears dentures    full upper    Tobacco History: Social History   Tobacco Use  Smoking Status Never  Smokeless Tobacco Never   Counseling given: Not Answered   Outpatient Medications Prior to Visit  Medication Sig Dispense Refill   Ascorbic Acid (VITAMIN C) 500 MG CHEW Chew 1 tablet by mouth daily.     aspirin 81 MG tablet Take 81 mg by mouth daily.     Calcium 500 MG tablet Take 500 mg  by mouth in the morning and at bedtime.     cholecalciferol (VITAMIN D3) 25 MCG (1000 UNIT) tablet Take 1,000 Units by mouth daily.     Ibuprofen (MOTRIN PO) Take by mouth as needed.     levothyroxine (SYNTHROID) 88 MCG tablet Take 1 tablet (88 mcg total) by mouth daily before breakfast. Empty stomach 90 tablet 3   magnesium chloride (SLOW-MAG) 64 MG TBEC SR tablet Take 1 tablet by mouth daily.     Meclizine HCl (ANTIVERT PO) Take by mouth as needed.     Multiple Vitamins-Minerals (MULTIVITAMIN ADULT PO) Take 1 tablet by mouth daily.     omeprazole (PRILOSEC) 40 MG capsule Take 1 capsule (40 mg total) by mouth daily. Wait 3-4 hours after thyroid med. Take 30 minutes before food lunch/dinner 90 capsule 3   PARoxetine (PAXIL) 20 MG tablet Take 1 tablet (20  mg total) by mouth daily. 90 tablet 3   polyethylene glycol powder (GLYCOLAX/MIRALAX) 17 GM/SCOOP powder Take 17 g by mouth daily as needed. 850 g 11   rosuvastatin (CRESTOR) 40 MG tablet Take 1 tablet (40 mg total) by mouth daily. 90 tablet 3   valACYclovir (VALTREX) 500 MG tablet TAKE 1 TAB DAILY AS PREVENTIVE OR TWICE DAILY FOR 3 DAYS AS NEEDED FOR SYMPTOM FLARE 180 tablet 1   No facility-administered medications prior to visit.     Review of Systems:   Constitutional:   No  weight loss, night sweats,  Fevers, chills, fatigue, or  lassitude.  HEENT:   No headaches,  Difficulty swallowing,  Tooth/dental problems, or  Sore throat,                No sneezing, itching, ear ache, nasal congestion, post nasal drip,   CV:  No chest pain,  Orthopnea, PND, swelling in lower extremities, anasarca, dizziness, palpitations, syncope.   GI  No heartburn, indigestion, abdominal pain, nausea, vomiting, diarrhea, change in bowel habits, loss of appetite, bloody stools.   Resp: No shortness of breath with exertion or at rest.  No excess mucus, no productive cough,  No non-productive cough,  No coughing up of blood.  No change in color of mucus.  No wheezing.  No chest wall deformity  Skin: no rash or lesions.  GU: no dysuria, change in color of urine, no urgency or frequency.  No flank pain, no hematuria   MS:  No joint pain or swelling.  No decreased range of motion.  No back pain.    Physical Exam  BP 108/66 (BP Location: Left Arm, Patient Position: Sitting, Cuff Size: Normal)   Pulse 84   Temp 97.7 F (36.5 C) (Oral)   Ht '5\' 3"'$  (1.6 m)   Wt 123 lb 12.8 oz (56.2 kg)   SpO2 100%   BMI 21.93 kg/m   GEN: A/Ox3; pleasant , NAD, well nourished    HEENT:  [place]/AT,  NOSE-clear, THROAT-clear, no lesions, no postnasal drip or exudate noted.  Tongue movement is midline  NECK:  Supple w/ fair ROM; no JVD; normal carotid impulses w/o bruits; no thyromegaly or nodules palpated; no lymphadenopathy.     RESP  Clear  P & A; w/o, wheezes/ rales/ or rhonchi. no accessory muscle use, no dullness to percussion  CARD:  RRR, no m/r/g, no peripheral edema, pulses intact, no cyanosis or clubbing.  GI:   Soft & nt; nml bowel sounds; no organomegaly or masses detected.   Musco: Warm bil, no deformities or joint swelling noted.  Neuro: alert, no focal deficits noted.    Skin: Warm, no lesions or rashes    Lab Results:    BNP No results found for: "BNP"  ProBNP No results found for: "PROBNP"  Imaging:       No data to display          No results found for: "NITRICOXIDE"      Assessment & Plan:   No problem-specific Assessment & Plan notes found for this encounter.     Rexene Edison, NP 08/01/2022

## 2022-08-01 NOTE — Patient Instructions (Signed)
Use INSPIRE device all night while sleeping .  Call for any questions or issues with INSPIRE  Follow up with Dr. Halford Chessman  or Jove Beyl NP in 6 months and As needed  (Virtual)

## 2022-08-05 NOTE — Assessment & Plan Note (Signed)
Patient has excellent compliance with her inspire device.  Recent inspire titration study shows optimal control at 1.3 V.  Patient has excellent perceived benefit.  Will continue on current settings.  Plan  Patient Instructions  Use INSPIRE device all night while sleeping .  Call for any questions or issues with INSPIRE  Follow up with Dr. Halford Chessman  or Dmoni Fortson NP in 6 months and As needed  (Virtual)

## 2022-08-05 NOTE — Progress Notes (Signed)
Reviewed and agree with assessment/plan.   Chesley Mires, MD [facility] Pulmonary/Critical Care 08/05/2022, 10:24 AM Pager:  [phone]

## 2022-08-14 ENCOUNTER — Encounter: Payer: Self-pay | Admitting: Adult Health

## 2022-09-12 ENCOUNTER — Encounter: Payer: Self-pay | Admitting: Nurse Practitioner

## 2022-09-12 DIAGNOSIS — K219 Gastro-esophageal reflux disease without esophagitis: Secondary | ICD-10-CM

## 2022-09-12 DIAGNOSIS — E039 Hypothyroidism, unspecified: Secondary | ICD-10-CM

## 2022-09-12 DIAGNOSIS — F3342 Major depressive disorder, recurrent, in full remission: Secondary | ICD-10-CM

## 2022-09-12 MED ORDER — LEVOTHYROXINE SODIUM 88 MCG PO TABS: 88.0000 ug | ORAL_TABLET | Freq: Every day | ORAL | 3 refills | Status: DC

## 2022-09-12 MED ORDER — POLYETHYLENE GLYCOL 3350 17 GM/SCOOP PO POWD: 17.0000 g | Freq: Every day | ORAL | 11 refills | Status: AC | PRN

## 2022-09-12 MED ORDER — PAROXETINE HCL 20 MG PO TABS: 20.0000 mg | ORAL_TABLET | Freq: Every day | ORAL | 3 refills | Status: DC

## 2022-09-12 MED ORDER — OMEPRAZOLE 40 MG PO CPDR: 40.0000 mg | DELAYED_RELEASE_CAPSULE | Freq: Every day | ORAL | 3 refills | Status: DC

## 2022-09-12 MED ORDER — ROSUVASTATIN CALCIUM 40 MG PO TABS: 40.0000 mg | ORAL_TABLET | Freq: Every day | ORAL | 3 refills | Status: DC

## 2022-09-26 ENCOUNTER — Telehealth: Payer: Self-pay | Admitting: Nurse Practitioner

## 2022-09-26 NOTE — Telephone Encounter (Signed)
Contacted Sandra Horton to schedule their annual wellness visit. Appointment made for 10/14/2022.  Thank you,  [facility] Support [facility] Direct dial  [phone]

## 2022-09-27 ENCOUNTER — Ambulatory Visit: Payer: Medicare Other | Admitting: Internal Medicine

## 2022-10-14 ENCOUNTER — Ambulatory Visit (INDEPENDENT_AMBULATORY_CARE_PROVIDER_SITE_OTHER): Payer: Medicare Other

## 2022-10-14 VITALS — Wt 123.0 lb

## 2022-10-14 DIAGNOSIS — Z0000 Encounter for general adult medical examination without abnormal findings: Secondary | ICD-10-CM

## 2022-10-14 NOTE — Progress Notes (Signed)
Subjective:   Sandra Horton is a 72 y.o. female who presents for Medicare Annual (Subsequent) preventive examination.  Review of Systems    I connected with  Maralyn Fasel Trickel on 10/14/22 by a audio enabled telemedicine application and verified that I am speaking with the correct person using two identifiers.  Patient Medicare AWV questionnaire was completed by the patient on 10/10/22; I have confirmed that all information answered by patient is correct and no changes since this date.     Patient Location: Home  Provider Location: Home Office  I discussed the limitations of evaluation and management by telemedicine. The patient expressed understanding and agreed to proceed.  Cardiac Risk Factors include: advanced age (>47men, >10 women)     Objective:    Today's Vitals   10/10/22 1913 10/14/22 0810  Weight:  123 lb (55.8 kg)  PainSc: 2     Body mass index is 21.79 kg/m.     10/14/2022    8:17 AM 07/03/2022    8:06 PM 02/26/2022   11:34 AM 09/19/2021    3:39 PM 09/14/2021    2:55 PM 09/18/2020    3:41 PM 09/15/2019    3:46 PM  Advanced Directives  Does Patient Have a Medical Advance Directive? Yes Yes Yes Yes Yes Yes Yes  Type of Estate agent of [place];Living will Healthcare Power of [place];Living will    Healthcare Power of [place];Living will Living will;Healthcare Power of Attorney  Does patient want to make changes to medical advance directive?  No - Patient declined No - Patient declined  No - Patient declined No - Patient declined No - Patient declined  Copy of Healthcare Power of Attorney in Chart? No - copy requested No - copy requested   No - copy requested No - copy requested No - copy requested  Would patient like information on creating a medical advance directive?     No - Patient declined      Current Medications (verified) Outpatient Encounter Medications as of 10/14/2022  Medication Sig   Ascorbic Acid (VITAMIN C) 500 MG CHEW Chew 1  tablet by mouth daily.   aspirin 81 MG tablet Take 81 mg by mouth daily.   Calcium 500 MG tablet Take 500 mg by mouth in the morning and at bedtime.   cholecalciferol (VITAMIN D3) 25 MCG (1000 UNIT) tablet Take 1,000 Units by mouth daily.   Ibuprofen (MOTRIN PO) Take by mouth as needed.   levothyroxine (SYNTHROID) 88 MCG tablet Take 1 tablet (88 mcg total) by mouth daily before breakfast. Empty stomach   magnesium chloride (SLOW-MAG) 64 MG TBEC SR tablet Take 1 tablet by mouth daily.   Meclizine HCl (ANTIVERT PO) Take by mouth as needed.   Multiple Vitamins-Minerals (MULTIVITAMIN ADULT PO) Take 1 tablet by mouth daily.   omeprazole (PRILOSEC) 40 MG capsule Take 1 capsule (40 mg total) by mouth daily. Wait 3-4 hours after thyroid med. Take 30 minutes before food lunch/dinner   PARoxetine (PAXIL) 20 MG tablet Take 1 tablet (20 mg total) by mouth daily.   polyethylene glycol powder (GLYCOLAX/MIRALAX) 17 GM/SCOOP powder Take 17 g by mouth daily as needed.   rosuvastatin (CRESTOR) 40 MG tablet Take 1 tablet (40 mg total) by mouth daily.   valACYclovir (VALTREX) 500 MG tablet TAKE 1 TAB DAILY AS PREVENTIVE OR TWICE DAILY FOR 3 DAYS AS NEEDED FOR SYMPTOM FLARE   No facility-administered encounter medications on file as of 10/14/2022.    Allergies (verified)  Penicillin v potassium, Penicillins, and Tetanus toxoid   History: Past Medical History:  Diagnosis Date   Allergy    Anti-cardiolipin antibody positive    Dr. Gavin Potters    Anxiety    Arthritis    BRCA negative 2016   MyRisk neg    Depression    Family history of ovarian cancer 2016   MyRisk neg; affected sister is BRCA neg   GERD (gastroesophageal reflux disease)    Herpes simplex    Hyperlipidemia    Hypothyroidism    Osteopenia    DEXA 09/02/13    Rosacea    Ruptured disc, cervical    Sleep apnea    no CPAP   Sleep apnea    Vertigo    per pt cervicogenic Physical therapy helped    Vitamin D deficiency    Wears dentures     full upper   Past Surgical History:  Procedure Laterality Date   CERVICAL BIOPSY  W/ LOOP ELECTRODE EXCISION     COLONOSCOPY     COLONOSCOPY WITH PROPOFOL N/A 03/30/2018   Procedure: COLONOSCOPY WITH Biopsies;  Surgeon: Midge Minium, MD;  Location: [facility];  Service: Endoscopy;  Laterality: N/A;   DILATION AND CURETTAGE OF UTERUS  30 years ago   DRUG INDUCED ENDOSCOPY N/A 09/25/2021   Procedure: DRUG INDUCED ENDOSCOPY;  Surgeon: Christia Reading, MD;  Location: [facility];  Service: ENT;  Laterality: N/A;   EYE SURGERY     b/l cataract    IMPLANTATION OF HYPOGLOSSAL NERVE STIMULATOR Right 02/26/2022   Procedure: IMPLANTATION OF HYPOGLOSSAL NERVE STIMULATOR;  Surgeon: Christia Reading, MD;  Location: [facility];  Service: ENT;  Laterality: Right;   POLYPECTOMY N/A 03/30/2018   Procedure: POLYPECTOMY;  Surgeon: Midge Minium, MD;  Location: [facility];  Service: Endoscopy;  Laterality: N/A;   Family History  Problem Relation Age of Onset   Cancer Mother        kidney met to lungs    Diabetes Mother    Thyroid disease Mother    Heart disease Father    Hypertension Father    Stroke Father    Heart attack Father 46   Thyroid disease Sister    Ovarian cancer Sister 16       BRCA neg   Colon cancer Maternal Aunt 35   Diabetes Daughter        type 1    Breast cancer Neg Hx    Social History   Socioeconomic History   Marital status: Married    Spouse name: Not on file   Number of children: Not on file   Years of education: Not on file   Highest education level: Not on file  Occupational History   Not on file  Tobacco Use   Smoking status: Never   Smokeless tobacco: Never  Vaping Use   Vaping Use: Never used  Substance and Sexual Activity   Alcohol use: No    Alcohol/week: 0.0 standard drinks of alcohol   Drug use: No   Sexual activity: Yes    Birth control/protection: Post-menopausal  Other Topics Concern   Not on file   Social History Narrative    [employer] retired as of 04/02/18    Social Determinants of Health   Financial Resource Strain: Low Risk  (10/14/2022)   Overall Financial Resource Strain (CARDIA)    Difficulty of Paying Living Expenses: Not hard at all  Food Insecurity: No Food Insecurity (10/14/2022)  Hunger Vital Sign    Worried About Running Out of Food in the Last Year: Never true    Ran Out of Food in the Last Year: Never true  Transportation Needs: No Transportation Needs (10/14/2022)   PRAPARE - Administrator, Civil Service (Medical): No    Lack of Transportation (Non-Medical): No  Physical Activity: Sufficiently Active (10/14/2022)   Exercise Vital Sign    Days of Exercise per Week: 7 days    Minutes of Exercise per Session: 30 min  Stress: No Stress Concern Present (10/14/2022)   [employer] of Occupational Health - Occupational Stress Questionnaire    Feeling of Stress : Not at all  Social Connections: Socially Integrated (10/14/2022)   Social Connection and Isolation Panel [NHANES]    Frequency of Communication with Friends and Family: More than three times a week    Frequency of Social Gatherings with Friends and Family: More than three times a week    Attends Religious Services: More than 4 times per year    Active Member of [employer] or Organizations: Yes    Attends Engineer, structural: More than 4 times per year    Marital Status: Married    Tobacco Counseling Counseling given: Yes   Clinical Intake:  Pre-visit preparation completed: Yes  Pain : 0-10 Pain Score: 2  Pain Type: Acute pain Pain Location: Back Pain Orientation: Lower Pain Descriptors / Indicators: Aching Pain Onset: In the past 7 days Pain Frequency: Intermittent     BMI - recorded: 21.79 Nutritional Status: BMI of 19-24  Normal Nutritional Risks: None Diabetes: No  How often do you need to have someone help you when you read instructions, pamphlets, or  other written materials from your doctor or pharmacy?: 1 - Never  Diabetic?no  Interpreter Needed?: No  Information entered by :: Fredirick Maudlin   Activities of Daily Living    10/10/2022    7:13 PM 02/26/2022   11:37 AM  In your present state of health, do you have any difficulty performing the following activities:  Hearing? 1 0  Vision? 0 0  Difficulty concentrating or making decisions? 0 0  Walking or climbing stairs? 0 0  Dressing or bathing? 0 0  Doing errands, shopping? 0   Preparing Food and eating ? N   Using the Toilet? N   In the past six months, have you accidently leaked urine? N   Do you have problems with loss of bowel control? N   Managing your Medications? N   Managing your Finances? N   Housekeeping or managing your Housekeeping? N     Patient Care Team: Bethanie Dicker, NP as PCP - General (Nurse Practitioner)  Indicate any recent Medical Services you may have received from other than [facility] providers in the past year (date may be approximate).     Assessment:   This is a routine wellness examination for [place].  Hearing/Vision screen Hearing Screening - Comments:: Denies hearing difficulties   Vision Screening - Comments:: Wears rx glasses - up to date with routine eye exams with [facility]   Dietary issues and exercise activities discussed: Current Exercise Habits: Home exercise routine, Type of exercise: walking, Time (Minutes): 30, Frequency (Times/Week): 5, Weekly Exercise (Minutes/Week): 150, Intensity: Moderate, Exercise limited by: None identified   Goals Addressed   None   Depression Screen    10/14/2022    8:15 AM 06/25/2022    9:42 AM 09/25/2021    8:08 AM  09/19/2021    3:36 PM 09/18/2020    3:37 PM 10/05/2019    8:10 AM 09/15/2019    3:47 PM  PHQ 2/9 Scores  PHQ - 2 Score 0 0 0 0 0 0 0    Fall Risk    10/10/2022    7:13 PM 06/25/2022    9:42 AM 09/25/2021    8:08 AM 09/19/2021    3:40 PM 09/18/2020    3:43 PM  Fall Risk    Falls in the past year? 0 0 0 0 0  Number falls in past yr: 0 0 0 0 0  Injury with Fall? 0 0 0  0  Risk for fall due to : No Fall Risks No Fall Risks No Fall Risks    Follow up Falls prevention discussed;Falls evaluation completed Falls evaluation completed Falls evaluation completed Falls evaluation completed Falls evaluation completed    FALL RISK PREVENTION PERTAINING TO THE HOME:  Any stairs in or around the home? Yes  If so, are there any without handrails? No  Home free of loose throw rugs in walkways, pet beds, electrical cords, etc? No  Adequate lighting in your home to reduce risk of falls? Yes   ASSISTIVE DEVICES UTILIZED TO PREVENT FALLS:  Life alert? No  Use of a cane, walker or w/c? No  Grab bars in the bathroom? No  Shower chair or bench in shower? No  Elevated toilet seat or a handicapped toilet? No   TIMED UP AND GO:  Was the test performed?  No televisit  .   Cognitive Function:    09/15/2019    3:48 PM  MMSE - Mini Mental State Exam  Not completed: Unable to complete        10/14/2022    8:15 AM  6CIT Screen  What Year? 0 points  What month? 0 points  What time? 0 points  Count back from 20 0 points  Months in reverse 0 points  Repeat phrase 0 points  Total Score 0 points    Immunizations Immunization History  Administered Date(s) Administered   Fluad Quad(high Dose 65+) 03/04/2022   Hepatitis B, ADULT 05/01/2017, 05/29/2017, 10/29/2017   Influenza, High Dose Seasonal PF 04/02/2018, 03/27/2021   Influenza, Seasonal, Injecte, Preservative Fre 03/03/2012   Influenza,inj,Quad PF,6+ Mos 06/15/2019   Influenza-Unspecified 03/23/2020, 05/12/2022   Pneumococcal Conjugate-13 02/22/2016, 04/21/2017   Pneumococcal Polysaccharide-23 04/22/2018   Zoster Recombinat (Shingrix) 12/06/2019, 02/14/2020   Zoster, Live 07/30/2012    TDAP status: Due, Education has been provided regarding the importance of this vaccine. Advised may receive this vaccine at  local pharmacy or Health Dept. Aware to provide a copy of the vaccination record if obtained from local pharmacy or Health Dept. Verbalized acceptance and understanding.  Flu Vaccine status: Up to date  Pneumococcal vaccine status: Up to date  Covid-19 vaccine status: Declined, Education has been provided regarding the importance of this vaccine but patient still declined. Advised may receive this vaccine at local pharmacy or Health Dept.or vaccine clinic. Aware to provide a copy of the vaccination record if obtained from local pharmacy or Health Dept. Verbalized acceptance and understanding.  Qualifies for Shingles Vaccine? Yes   Zostavax completed Yes   Shingrix Completed?: Yes  Screening Tests Health Maintenance  Topic Date Due   INFLUENZA VACCINE  01/02/2023   COLONOSCOPY (Pts 45-47yrs Insurance coverage will need to be confirmed)  03/31/2023   MAMMOGRAM  08/31/2023   Medicare Annual Wellness (AWV)  10/14/2023  Pneumonia Vaccine 48+ Years old  Completed   DEXA SCAN  Completed   Hepatitis C Screening  Completed   Zoster Vaccines- Shingrix  Completed   HPV VACCINES  Aged Out   DTaP/Tdap/Td  Discontinued   COVID-19 Vaccine  Discontinued    Health Maintenance  There are no preventive care reminders to display for this patient.   Colorectal cancer screening: Type of screening: Colonoscopy. Completed 03/30/2018. Repeat every 5 years  Mammogram status: Completed 09/25/2021. Repeat every year  Bone Density status: Completed 08/23/19. Results reflect: Bone density results: OSTEOPOROSIS. Repeat every 10 years.  Lung Cancer Screening: (Low Dose CT Chest recommended if Age 5-80 years, 30 pack-year currently smoking OR have quit w/in 15years.) does not qualify.     Additional Screening:  Hepatitis C Screening: does qualify; Completed 04/22/17  Vision Screening: Recommended annual ophthalmology exams for early detection of glaucoma and other disorders of the eye. Is the  patient up to date with their annual eye exam?  Yes  Who is the provider or what is the name of the office in which the patient attends annual eye exams? [facility] If pt is not established with a provider, would they like to be referred to a provider to establish care? No .   Dental Screening: Recommended annual dental exams for proper oral hygiene  Community Resource Referral / Chronic Care Management: CRR required this visit?  No   CCM required this visit?  No      Plan:     I have personally reviewed and noted the following in the patient's chart:   Medical and social history Use of alcohol, tobacco or illicit drugs  Current medications and supplements including opioid prescriptions. Patient is not currently taking opioid prescriptions. Functional ability and status Nutritional status Physical activity Advanced directives List of other physicians Hospitalizations, surgeries, and ER visits in previous 12 months Vitals Screenings to include cognitive, depression, and falls Referrals and appointments  In addition, I have reviewed and discussed with patient certain preventive protocols, quality metrics, and best practice recommendations. A written personalized care plan for preventive services as well as general preventive health recommendations were provided to patient.     Annabell Sabal, CMA   10/14/2022   Nurse Notes: none

## 2022-10-14 NOTE — Patient Instructions (Signed)
Sandra Horton , Thank you for taking time to come for your Medicare Wellness Visit. I appreciate your ongoing commitment to your health goals. Please review the following plan we discussed and let me know if I can assist you in the future.   These are the goals we discussed:  Goals      Follow up with Primary Care Provider     As needed        This is a list of the screening recommended for you and due dates:  Health Maintenance  Topic Date Due   Flu Shot  01/02/2023   Colon Cancer Screening  03/31/2023   Mammogram  08/31/2023   Medicare Annual Wellness Visit  10/14/2023   Pneumonia Vaccine  Completed   DEXA scan (bone density measurement)  Completed   Hepatitis C Screening: USPSTF Recommendation to screen - Ages 64-79 yo.  Completed   Zoster (Shingles) Vaccine  Completed   HPV Vaccine  Aged Out   DTaP/Tdap/Td vaccine  Discontinued   COVID-19 Vaccine  Discontinued    Advanced directives: Please bring a copy of your health care power of attorney and living will to the office to be added to your chart at your convenience.   Conditions/risks identified: Aim for 30 minutes of exercise or brisk walking, 6-8 glasses of water, and 5 servings of fruits and vegetables each day.   Next appointment: Follow up in one year for your annual wellness visit    Preventive Care 65 Years and Older, Female Preventive care refers to lifestyle choices and visits with your health care provider that can promote health and wellness. What does preventive care include? A yearly physical exam. This is also called an annual well check. Dental exams once or twice a year. Routine eye exams. Ask your health care provider how often you should have your eyes checked. Personal lifestyle choices, including: Daily care of your teeth and gums. Regular physical activity. Eating a healthy diet. Avoiding tobacco and drug use. Limiting alcohol use. Practicing safe sex. Taking low-dose aspirin every day. Taking  vitamin and mineral supplements as recommended by your health care provider. What happens during an annual well check? The services and screenings done by your health care provider during your annual well check will depend on your age, overall health, lifestyle risk factors, and family history of disease. Counseling  Your health care provider may ask you questions about your: Alcohol use. Tobacco use. Drug use. Emotional well-being. Home and relationship well-being. Sexual activity. Eating habits. History of falls. Memory and ability to understand (cognition). Work and work Astronomer. Reproductive health. Screening  You may have the following tests or measurements: Height, weight, and BMI. Blood pressure. Lipid and cholesterol levels. These may be checked every 5 years, or more frequently if you are over 85 years old. Skin check. Lung cancer screening. You may have this screening every year starting at age 38 if you have a 30-pack-year history of smoking and currently smoke or have quit within the past 15 years. Fecal occult blood test (FOBT) of the stool. You may have this test every year starting at age 63. Flexible sigmoidoscopy or colonoscopy. You may have a sigmoidoscopy every 5 years or a colonoscopy every 10 years starting at age 41. Hepatitis C blood test. Hepatitis B blood test. Sexually transmitted disease (STD) testing. Diabetes screening. This is done by checking your blood sugar (glucose) after you have not eaten for a while (fasting). You may have this done every 1-3 years.  Bone density scan. This is done to screen for osteoporosis. You may have this done starting at age 37. Mammogram. This may be done every 1-2 years. Talk to your health care provider about how often you should have regular mammograms. Talk with your health care provider about your test results, treatment options, and if necessary, the need for more tests. Vaccines  Your health care provider may  recommend certain vaccines, such as: Influenza vaccine. This is recommended every year. Tetanus, diphtheria, and acellular pertussis (Tdap, Td) vaccine. You may need a Td booster every 10 years. Zoster vaccine. You may need this after age 2. Pneumococcal 13-valent conjugate (PCV13) vaccine. One dose is recommended after age 39. Pneumococcal polysaccharide (PPSV23) vaccine. One dose is recommended after age 70. Talk to your health care provider about which screenings and vaccines you need and how often you need them. This information is not intended to replace advice given to you by your health care provider. Make sure you discuss any questions you have with your health care provider. Document Released: 06/16/2015 Document Revised: 02/07/2016 Document Reviewed: 03/21/2015 Elsevier Interactive Patient Education  2017 [place] Prevention in the Home Falls can cause injuries. They can happen to people of all ages. There are many things you can do to make your home safe and to help prevent falls. What can I do on the outside of my home? Regularly fix the edges of walkways and driveways and fix any cracks. Remove anything that might make you trip as you walk through a door, such as a raised step or threshold. Trim any bushes or trees on the path to your home. Use bright outdoor lighting. Clear any walking paths of anything that might make someone trip, such as rocks or tools. Regularly check to see if handrails are loose or broken. Make sure that both sides of any steps have handrails. Any raised decks and porches should have guardrails on the edges. Have any leaves, snow, or ice cleared regularly. Use sand or salt on walking paths during winter. Clean up any spills in your garage right away. This includes oil or grease spills. What can I do in the bathroom? Use night lights. Install grab bars by the toilet and in the tub and shower. Do not use towel bars as grab bars. Use non-skid  mats or decals in the tub or shower. If you need to sit down in the shower, use a plastic, non-slip stool. Keep the floor dry. Clean up any water that spills on the floor as soon as it happens. Remove soap buildup in the tub or shower regularly. Attach bath mats securely with double-sided non-slip rug tape. Do not have throw rugs and other things on the floor that can make you trip. What can I do in the bedroom? Use night lights. Make sure that you have a light by your bed that is easy to reach. Do not use any sheets or blankets that are too big for your bed. They should not hang down onto the floor. Have a firm chair that has side arms. You can use this for support while you get dressed. Do not have throw rugs and other things on the floor that can make you trip. What can I do in the kitchen? Clean up any spills right away. Avoid walking on wet floors. Keep items that you use a lot in easy-to-reach places. If you need to reach something above you, use a strong step stool that has a grab bar.  Keep electrical cords out of the way. Do not use floor polish or wax that makes floors slippery. If you must use wax, use non-skid floor wax. Do not have throw rugs and other things on the floor that can make you trip. What can I do with my stairs? Do not leave any items on the stairs. Make sure that there are handrails on both sides of the stairs and use them. Fix handrails that are broken or loose. Make sure that handrails are as long as the stairways. Check any carpeting to make sure that it is firmly attached to the stairs. Fix any carpet that is loose or worn. Avoid having throw rugs at the top or bottom of the stairs. If you do have throw rugs, attach them to the floor with carpet tape. Make sure that you have a light switch at the top of the stairs and the bottom of the stairs. If you do not have them, ask someone to add them for you. What else can I do to help prevent falls? Wear shoes  that: Do not have high heels. Have rubber bottoms. Are comfortable and fit you well. Are closed at the toe. Do not wear sandals. If you use a stepladder: Make sure that it is fully opened. Do not climb a closed stepladder. Make sure that both sides of the stepladder are locked into place. Ask someone to hold it for you, if possible. Clearly mark and make sure that you can see: Any grab bars or handrails. First and last steps. Where the edge of each step is. Use tools that help you move around (mobility aids) if they are needed. These include: Canes. Walkers. Scooters. Crutches. Turn on the lights when you go into a dark area. Replace any light bulbs as soon as they burn out. Set up your furniture so you have a clear path. Avoid moving your furniture around. If any of your floors are uneven, fix them. If there are any pets around you, be aware of where they are. Review your medicines with your doctor. Some medicines can make you feel dizzy. This can increase your chance of falling. Ask your doctor what other things that you can do to help prevent falls. This information is not intended to replace advice given to you by your health care provider. Make sure you discuss any questions you have with your health care provider. Document Released: 03/16/2009 Document Revised: 10/26/2015 Document Reviewed: 06/24/2014 Elsevier Interactive Patient Education  2017 [employer].

## 2022-10-16 ENCOUNTER — Ambulatory Visit
Admission: RE | Admit: 2022-10-16 | Discharge: 2022-10-16 | Disposition: A | Discharge status: Home / Self Care | Payer: Medicare Other | Source: Ambulatory Visit | Attending: Nurse Practitioner | Admitting: Nurse Practitioner

## 2022-10-16 DIAGNOSIS — Z1231 Encounter for screening mammogram for malignant neoplasm of breast: Secondary | ICD-10-CM | POA: Insufficient documentation

## 2022-10-27 ENCOUNTER — Encounter: Payer: Self-pay | Admitting: Nurse Practitioner

## 2022-11-12 ENCOUNTER — Other Ambulatory Visit: Payer: Self-pay | Admitting: Obstetrics and Gynecology

## 2022-11-12 DIAGNOSIS — A6004 Herpesviral vulvovaginitis: Secondary | ICD-10-CM

## 2022-11-17 ENCOUNTER — Encounter: Payer: Self-pay | Admitting: Nurse Practitioner

## 2022-11-18 ENCOUNTER — Telehealth: Payer: Self-pay

## 2022-11-18 NOTE — Telephone Encounter (Signed)
LMOM for pt to CB in regards to mychart msg in refill for omeprazole. Pt sattes her pharamcy wanted her to call us for a refill but on 09/12/22 90 supply was sent with 3 refills. Pt should still have some and plenty refills. I would like to confirm how much of the omperazole she is taking per day.

## 2023-01-28 ENCOUNTER — Encounter: Payer: Self-pay | Admitting: Adult Health

## 2023-01-28 ENCOUNTER — Telehealth: Payer: Medicare Other | Admitting: Adult Health

## 2023-01-28 DIAGNOSIS — G4733 Obstructive sleep apnea (adult) (pediatric): Secondary | ICD-10-CM | POA: Diagnosis not present

## 2023-01-28 NOTE — Progress Notes (Signed)
Virtual Visit via Video Note  I connected with Sandra Horton on 01/28/23 at  8:30 AM EDT by a video enabled telemedicine application and verified that I am speaking with the correct person using two identifiers.  Location: Patient: Home  Provider: Office    I discussed the limitations of evaluation and management by telemedicine and the availability of in person appointments. The patient expressed understanding and agreed to proceed.  History of Present Illness: 72 yo female followed for obstructive sleep apnea intolerant to CPAP.  Underwent inspire device placement September 2023 with ENT, Dr. Jenne Pane.  Today's video visit is a 61-month follow-up for sleep apnea.  Patient underwent hypoglossal nerve stimulator placement February 26, 2022.  Activation on April 05, 2022.  She has done exceptionally with inspire.  She says it is made a huge difference in her quality of life.  She feels it is very easy and convenient to use.  Her snoring has totally resolved.  Her sleep is more restful and she feels less daytime sleepiness.  She says she has been doing well since our last visit.  She uses her inspire every single night.  Inspire compliance shows 100% usage.  Daily average usage at 8.5 hours.  Current level is at 1.3.  Previous inspire titration on July 03, 2022 showed optimal control on 1.3 V with AHI at 0.  Patient's current range is 1.0 to 2.0 V.  Delayed time is 30 minutes.  Pause time is 15 minutes.  Duration is 10 hours.      Observations/Objective:  01/28/2023 Appears very well.   S/p hypoglossal nerve stimulator placement 02/26/22    PFT 01/15/21 >> FEV1 2.28 (106%), FEV1% 83, TLC 4.90 (90%), DLCO 94%   Sleep Tests:  HST 02/27/21 >> AHI 23.4, SpO2 low 81%. Auto CPAP 06/11/21 to 09/08/21 >> used on 49 of 90 nights with average 5 hrs 45 min.  Average AHI 2.8 with mean CPAP 8 and 95 th percentile CPAP 11 cm H2O  Inspire titration study 07/03/22 >> 1.3 V >> AHI 0    Cardiac Tests:  Echo  11/10/20 >> EF 55 to 60%, grade 1 DD, mild MR   Assessment and Plan: Moderate obstructive sleep apnea with excellent compliance on inspire device.  Patient has perceived benefit.  Continue on current settings.  Patient education was given.  Will check home sleep study prior to next office visit   Plan  Patient Instructions  Use INSPIRE device all night while sleeping .  Keep up great work, you are doing amazing.  Call for any questions or issues with INSPIRE  HST -Watch Pad prior to next visit .  Follow up with Dr. Wynona Neat or [name] NP in 6 months .     Follow Up Instructions:    I discussed the assessment and treatment plan with the patient. The patient was provided an opportunity to ask questions and all were answered. The patient agreed with the plan and demonstrated an understanding of the instructions.   The patient was advised to call back or seek an in-person evaluation if the symptoms worsen or if the condition fails to improve as anticipated.  I provided 22  minutes of non-face-to-face time during this encounter.   Rubye Oaks, NP

## 2023-01-28 NOTE — Patient Instructions (Signed)
Use INSPIRE device all night while sleeping .  Keep up great work, you are doing amazing.  Call for any questions or issues with INSPIRE  HST -Watch Pad prior to next visit .  Follow up with Dr. Wynona Neat or Abigael Mogle NP in 6 months .

## 2023-01-28 NOTE — Progress Notes (Signed)
Reviewed and agree with assessment/plan.   Coralyn Helling, MD [facility] Pulmonary/Critical Care 01/28/2023, 9:33 AM Pager:  [phone]

## 2023-02-14 DIAGNOSIS — D485 Neoplasm of uncertain behavior of skin: Secondary | ICD-10-CM | POA: Diagnosis not present

## 2023-02-14 DIAGNOSIS — H43813 Vitreous degeneration, bilateral: Secondary | ICD-10-CM | POA: Diagnosis not present

## 2023-02-14 DIAGNOSIS — D225 Melanocytic nevi of trunk: Secondary | ICD-10-CM | POA: Diagnosis not present

## 2023-02-14 DIAGNOSIS — L821 Other seborrheic keratosis: Secondary | ICD-10-CM | POA: Diagnosis not present

## 2023-02-14 DIAGNOSIS — Z961 Presence of intraocular lens: Secondary | ICD-10-CM | POA: Diagnosis not present

## 2023-02-14 DIAGNOSIS — D2261 Melanocytic nevi of right upper limb, including shoulder: Secondary | ICD-10-CM | POA: Diagnosis not present

## 2023-02-14 DIAGNOSIS — D2272 Melanocytic nevi of left lower limb, including hip: Secondary | ICD-10-CM | POA: Diagnosis not present

## 2023-02-14 DIAGNOSIS — C44529 Squamous cell carcinoma of skin of other part of trunk: Secondary | ICD-10-CM | POA: Diagnosis not present

## 2023-02-14 DIAGNOSIS — D2271 Melanocytic nevi of right lower limb, including hip: Secondary | ICD-10-CM | POA: Diagnosis not present

## 2023-02-14 DIAGNOSIS — D2262 Melanocytic nevi of left upper limb, including shoulder: Secondary | ICD-10-CM | POA: Diagnosis not present

## 2023-02-14 DIAGNOSIS — H35371 Puckering of macula, right eye: Secondary | ICD-10-CM | POA: Diagnosis not present

## 2023-02-14 DIAGNOSIS — L659 Nonscarring hair loss, unspecified: Secondary | ICD-10-CM | POA: Diagnosis not present

## 2023-03-10 ENCOUNTER — Other Ambulatory Visit: Payer: Self-pay

## 2023-03-10 ENCOUNTER — Telehealth: Payer: Self-pay

## 2023-03-10 DIAGNOSIS — Z860100 Personal history of colon polyps, unspecified: Secondary | ICD-10-CM

## 2023-03-10 MED ORDER — NA SULFATE-K SULFATE-MG SULF 17.5-3.13-1.6 GM/177ML PO SOLN
1.0000 | Freq: Once | ORAL | 0 refills | Status: AC
Start: 2023-03-10 — End: 2023-03-10

## 2023-03-10 NOTE — Telephone Encounter (Signed)
Gastroenterology Pre-Procedure Review  Request Date: 04/07/23 Requesting Physician: Dr. Servando Snare  PATIENT REVIEW QUESTIONS: The patient responded to the following health history questions as indicated:    1. Are you having any GI issues? no 2. Do you have a personal history of Polyps? yes (last colonoscopy 03/30/2018 with Dr. Servando Snare) 3. Do you have a family history of Colon Cancer or Polyps? yes (maternal aunt colon cancer) 4. Diabetes Mellitus? no 5. Joint replacements in the past 12 months?no 6. Major health problems in the past 3 months?no 7. Any artificial heart valves, MVP, or defibrillator?no    MEDICATIONS & ALLERGIES:    Patient reports the following regarding taking any anticoagulation/antiplatelet therapy:   Plavix, Coumadin, Eliquis, Xarelto, Lovenox, Pradaxa, Brilinta, or Effient? no Aspirin? no  Patient confirms/reports the following medications:  Current Outpatient Medications  Medication Sig Dispense Refill   Ascorbic Acid (VITAMIN C) 500 MG CHEW Chew 1 tablet by mouth daily.     aspirin 81 MG tablet Take 81 mg by mouth daily.     Calcium 500 MG tablet Take 500 mg by mouth in the morning and at bedtime.     cholecalciferol (VITAMIN D3) 25 MCG (1000 UNIT) tablet Take 1,000 Units by mouth daily.     Ibuprofen (MOTRIN PO) Take by mouth as needed.     levothyroxine (SYNTHROID) 88 MCG tablet Take 1 tablet (88 mcg total) by mouth daily before breakfast. Empty stomach 90 tablet 3   magnesium chloride (SLOW-MAG) 64 MG TBEC SR tablet Take 1 tablet by mouth daily.     Meclizine HCl (ANTIVERT PO) Take by mouth as needed.     Multiple Vitamins-Minerals (MULTIVITAMIN ADULT PO) Take 1 tablet by mouth daily.     omeprazole (PRILOSEC) 40 MG capsule Take 1 capsule (40 mg total) by mouth daily. Wait 3-4 hours after thyroid med. Take 30 minutes before food lunch/dinner 90 capsule 3   PARoxetine (PAXIL) 20 MG tablet Take 1 tablet (20 mg total) by mouth daily. 90 tablet 3   polyethylene glycol  powder (GLYCOLAX/MIRALAX) 17 GM/SCOOP powder Take 17 g by mouth daily as needed. 850 g 11   rosuvastatin (CRESTOR) 40 MG tablet Take 1 tablet (40 mg total) by mouth daily. 90 tablet 3   valACYclovir (VALTREX) 500 MG tablet TAKE 1 TAB DAILY AS PREVENTIVE OR TWICE DAILY FOR 3 DAYS AS NEEDED FOR SYMPTOM FLARE 180 tablet 1   No current facility-administered medications for this visit.    Patient confirms/reports the following allergies:  Allergies  Allergen Reactions   Penicillin V Potassium Anaphylaxis   Penicillins Anaphylaxis   Tetanus Toxoid Nausea And Vomiting    No orders of the defined types were placed in this encounter.   AUTHORIZATION INFORMATION Primary Insurance: 1D#: Group #:  Secondary Insurance: 1D#: Group #:  SCHEDULE INFORMATION: Date: 04/07/23 Time: Location: [facility]

## 2023-03-19 DIAGNOSIS — C44529 Squamous cell carcinoma of skin of other part of trunk: Secondary | ICD-10-CM | POA: Diagnosis not present

## 2023-03-19 DIAGNOSIS — L905 Scar conditions and fibrosis of skin: Secondary | ICD-10-CM | POA: Diagnosis not present

## 2023-03-24 DIAGNOSIS — Z23 Encounter for immunization: Secondary | ICD-10-CM | POA: Diagnosis not present

## 2023-03-31 ENCOUNTER — Encounter: Payer: Self-pay | Admitting: Gastroenterology

## 2023-04-01 NOTE — Anesthesia Preprocedure Evaluation (Addendum)
Anesthesia Evaluation  Patient identified by MRN, date of birth, ID band Patient awake    Reviewed: Allergy & Precautions, H&P , NPO status , Patient's Chart, lab work & pertinent test results  Airway Mallampati: III  TM Distance: <3 FB Neck ROM: Full   Comment: Patient has "Inspire" for sleep apnea Dental no notable dental hx.    Pulmonary neg pulmonary ROS, sleep apnea    Pulmonary exam normal breath sounds clear to auscultation       Cardiovascular negative cardio ROS Normal cardiovascular exam Rhythm:Regular Rate:Normal     Neuro/Psych  PSYCHIATRIC DISORDERS Anxiety Depression     Neuromuscular disease negative neurological ROS  negative psych ROS   GI/Hepatic Neg liver ROS,GERD  ,,  Endo/Other  negative endocrine ROSHypothyroidism    Renal/GU negative Renal ROS  negative genitourinary   Musculoskeletal negative musculoskeletal ROS (+) Arthritis ,    Abdominal   Peds negative pediatric ROS (+)  Hematology negative hematology ROS (+)   Anesthesia Other Findings GERD (gastroesophageal reflux disease)  Anxiety Rosacea  Depression Hyperlipidemia  Allergy Vertigo  Herpes simplex Family history of ovarian cancer  BRCA negative Arthritis  Hypothyroidism Vitamin D deficiency  Osteopenia Wears dentures  Ruptured disc, cervical Anti-cardiolipin antibody positive  Sleep apnea, was unable to use CPAP, mask did not fit, but has "Inspire" now and does well with that.    Reproductive/Obstetrics negative OB ROS                             Anesthesia Physical Anesthesia Plan  ASA: 2  Anesthesia Plan: General   Post-op Pain Management:    Induction: Intravenous  PONV Risk Score and Plan:   Airway Management Planned: Natural Airway and Nasal Cannula  Additional Equipment:   Intra-op Plan:   Post-operative Plan:   Informed Consent: I have reviewed the patients History and  Physical, chart, labs and discussed the procedure including the risks, benefits and alternatives for the proposed anesthesia with the patient or authorized representative who has indicated his/her understanding and acceptance.     Dental Advisory Given  Plan Discussed with: Anesthesiologist, CRNA and Surgeon  Anesthesia Plan Comments: (Patient consented for risks of anesthesia including but not limited to:  - adverse reactions to medications - risk of airway placement if required - damage to eyes, teeth, lips or other oral mucosa - nerve damage due to positioning  - sore throat or hoarseness - Damage to heart, brain, nerves, lungs, other parts of body or loss of life  Patient voiced understanding and assent.)       Anesthesia Quick Evaluation

## 2023-04-07 ENCOUNTER — Encounter: Payer: Self-pay | Admitting: Gastroenterology

## 2023-04-07 ENCOUNTER — Ambulatory Visit: Payer: Medicare Other | Admitting: Anesthesiology

## 2023-04-07 ENCOUNTER — Ambulatory Visit
Admission: RE | Admit: 2023-04-07 | Discharge: 2023-04-07 | Disposition: A | Discharge status: Home / Self Care | Payer: Medicare Other | Attending: Gastroenterology | Admitting: Gastroenterology

## 2023-04-07 ENCOUNTER — Other Ambulatory Visit: Payer: Self-pay

## 2023-04-07 ENCOUNTER — Encounter
Admission: RE | Disposition: A | Discharge status: Home / Self Care | Payer: Self-pay | Source: Home / Self Care | Attending: Gastroenterology

## 2023-04-07 DIAGNOSIS — K635 Polyp of colon: Secondary | ICD-10-CM

## 2023-04-07 DIAGNOSIS — K5730 Diverticulosis of large intestine without perforation or abscess without bleeding: Secondary | ICD-10-CM | POA: Insufficient documentation

## 2023-04-07 DIAGNOSIS — Z1211 Encounter for screening for malignant neoplasm of colon: Secondary | ICD-10-CM | POA: Diagnosis not present

## 2023-04-07 DIAGNOSIS — G709 Myoneural disorder, unspecified: Secondary | ICD-10-CM | POA: Diagnosis not present

## 2023-04-07 DIAGNOSIS — Z860101 Personal history of adenomatous and serrated colon polyps: Secondary | ICD-10-CM | POA: Diagnosis present

## 2023-04-07 DIAGNOSIS — M1990 Unspecified osteoarthritis, unspecified site: Secondary | ICD-10-CM | POA: Insufficient documentation

## 2023-04-07 DIAGNOSIS — Z8349 Family history of other endocrine, nutritional and metabolic diseases: Secondary | ICD-10-CM | POA: Insufficient documentation

## 2023-04-07 DIAGNOSIS — K219 Gastro-esophageal reflux disease without esophagitis: Secondary | ICD-10-CM | POA: Diagnosis not present

## 2023-04-07 DIAGNOSIS — F419 Anxiety disorder, unspecified: Secondary | ICD-10-CM | POA: Diagnosis not present

## 2023-04-07 DIAGNOSIS — K641 Second degree hemorrhoids: Secondary | ICD-10-CM | POA: Diagnosis not present

## 2023-04-07 DIAGNOSIS — Z800 Family history of malignant neoplasm of digestive organs: Secondary | ICD-10-CM | POA: Diagnosis not present

## 2023-04-07 DIAGNOSIS — F32A Depression, unspecified: Secondary | ICD-10-CM | POA: Insufficient documentation

## 2023-04-07 DIAGNOSIS — Z860100 Personal history of colon polyps, unspecified: Secondary | ICD-10-CM

## 2023-04-07 DIAGNOSIS — G4730 Sleep apnea, unspecified: Secondary | ICD-10-CM | POA: Insufficient documentation

## 2023-04-07 DIAGNOSIS — E039 Hypothyroidism, unspecified: Secondary | ICD-10-CM | POA: Insufficient documentation

## 2023-04-07 HISTORY — PX: COLONOSCOPY WITH PROPOFOL: SHX5780

## 2023-04-07 SURGERY — COLONOSCOPY WITH PROPOFOL
Anesthesia: General | Site: Rectum

## 2023-04-07 MED ORDER — LIDOCAINE HCL (PF) 2 % IJ SOLN
INTRAMUSCULAR | Status: AC
  Filled 2023-04-07: qty 5

## 2023-04-07 MED ORDER — PROPOFOL 10 MG/ML IV BOLUS
INTRAVENOUS | Status: DC | PRN
  Administered 2023-04-07: 80 mg via INTRAVENOUS
  Administered 2023-04-07 (×5): 20 mg via INTRAVENOUS

## 2023-04-07 MED ORDER — PROPOFOL 10 MG/ML IV BOLUS
INTRAVENOUS | Status: AC
  Filled 2023-04-07: qty 40

## 2023-04-07 MED ORDER — SODIUM CHLORIDE 0.9% FLUSH: 10.0000 mL | INTRAVENOUS | Status: DC | PRN

## 2023-04-07 MED ORDER — STERILE WATER FOR IRRIGATION IR SOLN
Status: DC | PRN
  Administered 2023-04-07: 1

## 2023-04-07 MED ORDER — SODIUM CHLORIDE 0.9 % IV SOLN: INTRAVENOUS | Status: DC

## 2023-04-07 MED ORDER — LACTATED RINGERS IV SOLN: INTRAVENOUS | Status: DC

## 2023-04-07 MED ORDER — LIDOCAINE HCL (CARDIAC) PF 100 MG/5ML IV SOSY
PREFILLED_SYRINGE | INTRAVENOUS | Status: DC | PRN
  Administered 2023-04-07: 50 mg via INTRAVENOUS

## 2023-04-07 SURGICAL SUPPLY — 21 items

## 2023-04-07 NOTE — Transfer of Care (Signed)
Immediate Anesthesia Transfer of Care Note  Patient: Sandra Horton  Procedure(s) Performed: COLONOSCOPY WITH PROPOFOL (Rectum)  Patient Location: PACU  Anesthesia Type: General  Level of Consciousness: awake, alert  and patient cooperative  Airway and Oxygen Therapy: Patient Spontanous Breathing and Patient connected to supplemental oxygen  Post-op Assessment: Post-op Vital signs reviewed, Patient's Cardiovascular Status Stable, Respiratory Function Stable, Patent Airway and No signs of Nausea or vomiting  Post-op Vital Signs: Reviewed and stable  Complications: No notable events documented.

## 2023-04-07 NOTE — Anesthesia Postprocedure Evaluation (Signed)
Anesthesia Post Note  Patient: Sandra Horton  Procedure(s) Performed: COLONOSCOPY WITH PROPOFOL with polypectomy (Rectum)  Patient location during evaluation: PACU Anesthesia Type: General Level of consciousness: awake and alert Pain management: pain level controlled Vital Signs Assessment: post-procedure vital signs reviewed and stable Respiratory status: spontaneous breathing, nonlabored ventilation, respiratory function stable and patient connected to nasal cannula oxygen Cardiovascular status: blood pressure returned to baseline and stable Postop Assessment: no apparent nausea or vomiting Anesthetic complications: no   No notable events documented.   Last Vitals:  Vitals:   04/07/23 1020 04/07/23 1025  BP:  104/66  Pulse: 67 65  Resp: 17 15  Temp:    SpO2: 98% 99%    Last Pain:  Vitals:   04/07/23 1025  TempSrc:   PainSc: 0-No pain                 Buffy Ehler C Edman Lipsey

## 2023-04-07 NOTE — H&P (Signed)
Midge Minium, MD [facility] [address] [place], [place] [address] [phone] Fax : [phone]  Primary Care Physician:  Bethanie Dicker, NP Primary Gastroenterologist:  Dr. Servando Snare  Pre-Procedure History & Physical: HPI:  Sandra Horton is a 72 y.o. female is here for an colonoscopy.   Past Medical History:  Diagnosis Date   Allergy    Anti-cardiolipin antibody positive    Dr. Gavin Potters    Anxiety    Arthritis    BRCA negative 2016   MyRisk neg    Depression    Family history of ovarian cancer 2016   MyRisk neg; affected sister is BRCA neg   GERD (gastroesophageal reflux disease)    Herpes simplex    Hyperlipidemia    Hypothyroidism    Osteopenia    DEXA 09/02/13    Rosacea    Ruptured disc, cervical    Sleep apnea    no CPAP.  has Inspire   Vertigo    per pt cervicogenic Physical therapy helped    Vitamin D deficiency    Wears dentures    full upper    Past Surgical History:  Procedure Laterality Date   CERVICAL BIOPSY  W/ LOOP ELECTRODE EXCISION     COLONOSCOPY     COLONOSCOPY WITH PROPOFOL N/A 03/30/2018   Procedure: COLONOSCOPY WITH Biopsies;  Surgeon: Midge Minium, MD;  Location: [facility];  Service: Endoscopy;  Laterality: N/A;   DILATION AND CURETTAGE OF UTERUS  30 years ago   DRUG INDUCED ENDOSCOPY N/A 09/25/2021   Procedure: DRUG INDUCED ENDOSCOPY;  Surgeon: Christia Reading, MD;  Location: [facility];  Service: ENT;  Laterality: N/A;   EYE SURGERY     b/l cataract    IMPLANTATION OF HYPOGLOSSAL NERVE STIMULATOR Right 02/26/2022   Procedure: IMPLANTATION OF HYPOGLOSSAL NERVE STIMULATOR;  Surgeon: Christia Reading, MD;  Location: [facility];  Service: ENT;  Laterality: Right;   POLYPECTOMY N/A 03/30/2018   Procedure: POLYPECTOMY;  Surgeon: Midge Minium, MD;  Location: [facility];  Service: Endoscopy;  Laterality: N/A;    Prior to Admission medications   Medication Sig Start Date End Date Taking?  Authorizing Provider  Ascorbic Acid (VITAMIN C) 500 MG CHEW Chew 1 tablet by mouth daily. 06/04/19  Yes [provider]  aspirin 81 MG tablet Take 81 mg by mouth daily.   Yes [provider]  Calcium 500 MG tablet Take 500 mg by mouth in the morning and at bedtime. 09/20/19  Yes [provider]  cholecalciferol (VITAMIN D3) 25 MCG (1000 UNIT) tablet Take 1,000 Units by mouth daily.   Yes [provider]  Ibuprofen (MOTRIN PO) Take by mouth as needed.   Yes [provider]  levothyroxine (SYNTHROID) 88 MCG tablet Take 1 tablet (88 mcg total) by mouth daily before breakfast. Empty stomach 09/12/22  Yes Bethanie Dicker, NP  magnesium chloride (SLOW-MAG) 64 MG TBEC SR tablet Take 1 tablet by mouth daily.   Yes [provider]  Meclizine HCl (ANTIVERT PO) Take by mouth as needed.   Yes [provider]  Multiple Vitamins-Minerals (MULTIVITAMIN ADULT PO) Take 1 tablet by mouth daily. 06/03/18  Yes [provider]  omeprazole (PRILOSEC) 40 MG capsule Take 1 capsule (40 mg total) by mouth daily. Wait 3-4 hours after thyroid med. Take 30 minutes before food lunch/dinner 09/12/22  Yes Bethanie Dicker, NP  PARoxetine (PAXIL) 20 MG tablet Take 1 tablet (20 mg total) by mouth daily. 09/12/22  Yes Bethanie Dicker, NP  polyethylene glycol powder (GLYCOLAX/MIRALAX) 17 GM/SCOOP powder Take 17 g by mouth daily as needed. 09/12/22  Yes Bethanie Dicker, NP  rosuvastatin (CRESTOR) 40 MG tablet Take 1 tablet (40 mg total) by mouth daily. 09/12/22 09/07/23 Yes Bethanie Dicker, NP  valACYclovir (VALTREX) 500 MG tablet TAKE 1 TAB DAILY AS PREVENTIVE OR TWICE DAILY FOR 3 DAYS AS NEEDED FOR SYMPTOM FLARE 11/12/22  Yes Copland, Alicia B, PA-C    Allergies as of 03/10/2023 - Review Complete 03/10/2023  Allergen Reaction Noted   Penicillin v potassium Anaphylaxis 11/03/2014   Penicillins Anaphylaxis 11/03/2014   Tetanus toxoid Nausea And Vomiting 11/03/2014    Family History   Problem Relation Age of Onset   Cancer Mother        kidney met to lungs    Diabetes Mother    Thyroid disease Mother    Heart disease Father    Hypertension Father    Stroke Father    Heart attack Father 50   Thyroid disease Sister    Ovarian cancer Sister 62       BRCA neg   Colon cancer Maternal Aunt 57   Diabetes Daughter        type 1    Breast cancer Neg Hx     Social History   Socioeconomic History   Marital status: Married    Spouse name: Not on file   Number of children: Not on file   Years of education: Not on file   Highest education level: Not on file  Occupational History   Not on file  Tobacco Use   Smoking status: Never   Smokeless tobacco: Never  Vaping Use   Vaping status: Never Used  Substance and Sexual Activity   Alcohol use: No    Alcohol/week: 0.0 standard drinks of alcohol   Drug use: No   Sexual activity: Yes    Birth control/protection: Post-menopausal  Other Topics Concern   Not on file  Social History Narrative    [employer] [facility] retired as of 04/02/18    Social Determinants of Health   Financial Resource Strain: Low Risk  (10/14/2022)   Overall Financial Resource Strain (CARDIA)    Difficulty of Paying Living Expenses: Not hard at all  Food Insecurity: No Food Insecurity (10/14/2022)   Hunger Vital Sign    Worried About Running Out of Food in the Last Year: Never true    Ran Out of Food in the Last Year: Never true  Transportation Needs: No Transportation Needs (10/14/2022)   PRAPARE - Administrator, Civil Service (Medical): No    Lack of Transportation (Non-Medical): No  Physical Activity: Sufficiently Active (10/14/2022)   Exercise Vital Sign    Days of Exercise per Week: 7 days    Minutes of Exercise per Session: 30 min  Stress: No Stress Concern Present (10/14/2022)   [employer] of Occupational Health - Occupational Stress Questionnaire    Feeling of Stress : Not at all  Social Connections: Socially  Integrated (10/14/2022)   Social Connection and Isolation Panel [NHANES]    Frequency of Communication with Friends and Family: More than three times a week    Frequency of Social Gatherings with Friends and Family: More than three times a week    Attends Religious Services: More than 4 times per year    Active Member of [employer] or Organizations: Yes    Attends Banker Meetings: More than 4 times per  year    Marital Status: Married  Catering manager Violence: Not At Risk (10/14/2022)   Humiliation, Afraid, Rape, and Kick questionnaire    Fear of Current or Ex-Partner: No    Emotionally Abused: No    Physically Abused: No    Sexually Abused: No    Review of Systems: See HPI, otherwise negative ROS  Physical Exam: BP 111/72   Temp (!) 97.3 F (36.3 C) (Temporal)   Resp 16   Ht 5' 2.99" (1.6 m)   Wt 50.8 kg   SpO2 100%   BMI 19.84 kg/m  General:   Alert,  pleasant and cooperative in NAD Head:  Normocephalic and atraumatic. Neck:  Supple; no masses or thyromegaly. Lungs:  Clear throughout to auscultation.    Heart:  Regular rate and rhythm. Abdomen:  Soft, nontender and nondistended. Normal bowel sounds, without guarding, and without rebound.   Neurologic:  Alert and  oriented x4;  grossly normal neurologically.  Impression/Plan: Sandra Horton is here for an colonoscopy to be performed for a history of adenomatous polyps on 2019   Risks, benefits, limitations, and alternatives regarding  colonoscopy have been reviewed with the patient.  Questions have been answered.  All parties agreeable.   Midge Minium, MD  04/07/2023, 9:20 AM

## 2023-04-07 NOTE — Op Note (Addendum)
[facility] Gastroenterology Patient Name: Sandra Horton Procedure Date: 04/07/2023 9:46 AM MRN: [id number] Account #: [id number] Date of Birth: [birth year] Admit Type: Outpatient Age: 72 Room: [facility] OR ROOM 01 Gender: Female Note Status: Finalized Instrument Name: [id number] Procedure:             Colonoscopy Indications:           High risk colon cancer surveillance: Personal history                         of colonic polyps Providers:             Midge Minium MD, MD Referring MD:          Bethanie Dicker (Referring MD) Medicines:             Propofol per Anesthesia Complications:         No immediate complications. Procedure:             Pre-Anesthesia Assessment:                        - Prior to the procedure, a History and Physical was                         performed, and patient medications and allergies were                         reviewed. The patient's tolerance of previous                         anesthesia was also reviewed. The risks and benefits                         of the procedure and the sedation options and risks                         were discussed with the patient. All questions were                         answered, and informed consent was obtained. Prior                         Anticoagulants: The patient has taken no anticoagulant                         or antiplatelet agents. ASA Grade Assessment: II - A                         patient with mild systemic disease. After reviewing                         the risks and benefits, the patient was deemed in                         satisfactory condition to undergo the procedure.                        After obtaining informed consent, the colonoscope was  passed under direct vision. Throughout the procedure,                         the patient's blood pressure, pulse, and oxygen                         saturations were monitored continuously. The was                          introduced through the anus and advanced to the the                         cecum, identified by appendiceal orifice and ileocecal                         valve. The colonoscopy was performed without                         difficulty. The patient tolerated the procedure well.                         The quality of the bowel preparation was excellent. Findings:      The perianal and digital rectal examinations were normal.      A 4 mm polyp was found in the sigmoid colon. The polyp was sessile. The       polyp was removed with a cold snare. Resection and retrieval were       complete.      A few small-mouthed diverticula were found in the entire colon.      Non-bleeding internal hemorrhoids were found during retroflexion. The       hemorrhoids were Grade II (internal hemorrhoids that prolapse but reduce       spontaneously). Impression:            - One 4 mm polyp in the sigmoid colon, removed with a                         cold snare. Resected and retrieved.                        - Diverticulosis in the entire examined colon.                        - Non-bleeding internal hemorrhoids. Recommendation:        - Discharge patient to home.                        - Resume previous diet.                        - Continue present medications.                        - Await pathology results.                        - Repeat colonoscopy in 7 years for surveillance. Procedure Code(s):     --- Professional ---  95284, Colonoscopy, flexible; with removal of                         tumor(s), polyp(s), or other lesion(s) by snare                         technique Diagnosis Code(s):     --- Professional ---                        Z86.010, Personal history of colonic polyps                        D12.5, Benign neoplasm of sigmoid colon CPT copyright 2022 American Medical Association. All rights reserved. The codes documented in this report are preliminary and upon coder review  may  be revised to meet current compliance requirements. Midge Minium MD, MD 04/07/2023 10:14:49 AM This report has been signed electronically. Number of Addenda: 0 Note Initiated On: 04/07/2023 9:46 AM Scope Withdrawal Time: 0 hours 8 minutes 8 seconds  Total Procedure Duration: 0 hours 15 minutes 55 seconds  Estimated Blood Loss:  Estimated blood loss: none.      [facility]

## 2023-04-08 ENCOUNTER — Encounter: Payer: Self-pay | Admitting: Gastroenterology

## 2023-04-08 LAB — SURGICAL PATHOLOGY

## 2023-04-22 ENCOUNTER — Ambulatory Visit: Payer: Medicare Other

## 2023-04-22 ENCOUNTER — Encounter: Payer: Self-pay | Admitting: Medical

## 2023-04-22 ENCOUNTER — Ambulatory Visit: Payer: Medicare Other | Attending: Medical | Admitting: Medical

## 2023-04-22 VITALS — BP 116/70 | HR 64 | Ht 63.0 in | Wt 116.8 lb

## 2023-04-22 DIAGNOSIS — G4733 Obstructive sleep apnea (adult) (pediatric): Secondary | ICD-10-CM | POA: Diagnosis not present

## 2023-04-22 DIAGNOSIS — E785 Hyperlipidemia, unspecified: Secondary | ICD-10-CM | POA: Insufficient documentation

## 2023-04-22 DIAGNOSIS — I38 Endocarditis, valve unspecified: Secondary | ICD-10-CM | POA: Diagnosis not present

## 2023-04-22 DIAGNOSIS — I779 Disorder of arteries and arterioles, unspecified: Secondary | ICD-10-CM | POA: Diagnosis not present

## 2023-04-22 DIAGNOSIS — I5032 Chronic diastolic (congestive) heart failure: Secondary | ICD-10-CM | POA: Diagnosis not present

## 2023-04-22 DIAGNOSIS — R002 Palpitations: Secondary | ICD-10-CM | POA: Insufficient documentation

## 2023-04-22 NOTE — Progress Notes (Signed)
Cardiology Office Note:    Date:  04/22/2023   ID:  Bari Mantis, DOB 1951-05-28, MRN [id number]  PCP:  Bethanie Dicker, NP  [facility] Cardiologist:  None  [facility] Electrophysiologist:  None   Referring MD: Bethanie Dicker, NP   Chief Complaint: 1 year follow-up  History of Present Illness:    Sandra Horton is a 72 y.o. female with a hx HLD, depression, anxiety, OSA s/p Inspire implant, aortic atherosclerosis on CT who is being seen for 1 year follow-up.    She saw Dr. Okey Dupre 10/11/20 for SOB and chest pain for the last 1.5 years. Myoview and echo were ordered. Myoview showed TWI inversion during stress, overall normal, low risk, no evidence of ischemia. Echo showed LVEF 55-60%, G1DD, normal RV function and PA pressure, mild MR, mild late prolapse of posterior leaflet of MV, mod TR, mild thicking of aortic valve, trivial AI, mild sclerosis aortic valve.     Seen 11/16/20 via televisit for review of myoview and echo, ordered for sob and cest pain.  Myoview was low risk with no ischemia. Echo showed preserved EF, G1DD, and mild valvular disease. She did not want to undergo invasive procedures since symptoms were not limiting her function.    Patient was last seen in May 2023 reporting shortness of breath and was to undergo sleep apnea testing.  Today, she has been doing well. She has an Inspire sleep implant. She has occasional chest pain across the lower left side of the chest. She suspects it may be MSK. It's not worse with exertion. No significant SOB. No lower leg edema. She has chronic vertigo. She has palpitations at night that has been more noticeable for the last few months. She has been walking more and homeschooling a grandson. Labs from 06/2022 reviewed.  Past Medical History:  Diagnosis Date   Allergy    Anti-cardiolipin antibody positive    Dr. Gavin Potters    Anxiety    Arthritis    BRCA negative 2016   MyRisk neg    Depression    Family history of ovarian cancer 2016    MyRisk neg; affected sister is BRCA neg   GERD (gastroesophageal reflux disease)    Herpes simplex    Hyperlipidemia    Hypothyroidism    Osteopenia    DEXA 09/02/13    Rosacea    Ruptured disc, cervical    Sleep apnea    no CPAP.  has Inspire   Vertigo    per pt cervicogenic Physical therapy helped    Vitamin D deficiency    Wears dentures    full upper    Past Surgical History:  Procedure Laterality Date   CERVICAL BIOPSY  W/ LOOP ELECTRODE EXCISION     COLONOSCOPY     COLONOSCOPY WITH PROPOFOL N/A 03/30/2018   Procedure: COLONOSCOPY WITH Biopsies;  Surgeon: Midge Minium, MD;  Location: [facility];  Service: Endoscopy;  Laterality: N/A;   COLONOSCOPY WITH PROPOFOL N/A 04/07/2023   Procedure: COLONOSCOPY WITH PROPOFOL with polypectomy;  Surgeon: Midge Minium, MD;  Location: [facility];  Service: Endoscopy;  Laterality: N/A;   DILATION AND CURETTAGE OF UTERUS  30 years ago   DRUG INDUCED ENDOSCOPY N/A 09/25/2021   Procedure: DRUG INDUCED ENDOSCOPY;  Surgeon: Christia Reading, MD;  Location: [facility];  Service: ENT;  Laterality: N/A;   EYE SURGERY     b/l cataract    IMPLANTATION OF HYPOGLOSSAL NERVE STIMULATOR Right 02/26/2022  Procedure: IMPLANTATION OF HYPOGLOSSAL NERVE STIMULATOR;  Surgeon: Christia Reading, MD;  Location: [facility];  Service: ENT;  Laterality: Right;   POLYPECTOMY N/A 03/30/2018   Procedure: POLYPECTOMY;  Surgeon: Midge Minium, MD;  Location: [facility];  Service: Endoscopy;  Laterality: N/A;    Current Medications: Current Meds  Medication Sig   Ascorbic Acid (VITAMIN C) 500 MG CHEW Chew 1 tablet by mouth daily.   aspirin 81 MG tablet Take 81 mg by mouth daily.   Calcium 500 MG tablet Take 500 mg by mouth in the morning and at bedtime.   cholecalciferol (VITAMIN D3) 25 MCG (1000 UNIT) tablet Take 1,000 Units by mouth daily.   Ibuprofen (MOTRIN PO) Take by mouth as needed.   levothyroxine (SYNTHROID)  88 MCG tablet Take 1 tablet (88 mcg total) by mouth daily before breakfast. Empty stomach   magnesium chloride (SLOW-MAG) 64 MG TBEC SR tablet Take 1 tablet by mouth daily.   Meclizine HCl (ANTIVERT PO) Take by mouth as needed.   Multiple Vitamins-Minerals (MULTIVITAMIN ADULT PO) Take 1 tablet by mouth daily.   omeprazole (PRILOSEC) 40 MG capsule Take 1 capsule (40 mg total) by mouth daily. Wait 3-4 hours after thyroid med. Take 30 minutes before food lunch/dinner   PARoxetine (PAXIL) 20 MG tablet Take 1 tablet (20 mg total) by mouth daily.   polyethylene glycol powder (GLYCOLAX/MIRALAX) 17 GM/SCOOP powder Take 17 g by mouth daily as needed.   rosuvastatin (CRESTOR) 40 MG tablet Take 1 tablet (40 mg total) by mouth daily.   valACYclovir (VALTREX) 500 MG tablet TAKE 1 TAB DAILY AS PREVENTIVE OR TWICE DAILY FOR 3 DAYS AS NEEDED FOR SYMPTOM FLARE     Allergies:   Penicillin v potassium, Penicillins, and Tetanus toxoid   Social History   Socioeconomic History   Marital status: Married    Spouse name: Not on file   Number of children: Not on file   Years of education: Not on file   Highest education level: Not on file  Occupational History   Not on file  Tobacco Use   Smoking status: Never   Smokeless tobacco: Never  Vaping Use   Vaping status: Never Used  Substance and Sexual Activity   Alcohol use: No    Alcohol/week: 0.0 standard drinks of alcohol   Drug use: No   Sexual activity: Yes    Birth control/protection: Post-menopausal  Other Topics Concern   Not on file  Social History Narrative    [employer] [facility] retired as of 04/02/18    Social Determinants of Health   Financial Resource Strain: Low Risk  (10/14/2022)   Overall Financial Resource Strain (CARDIA)    Difficulty of Paying Living Expenses: Not hard at all  Food Insecurity: No Food Insecurity (10/14/2022)   Hunger Vital Sign    Worried About Running Out of Food in the Last Year: Never true    Ran Out of Food in  the Last Year: Never true  Transportation Needs: No Transportation Needs (10/14/2022)   PRAPARE - Administrator, Civil Service (Medical): No    Lack of Transportation (Non-Medical): No  Physical Activity: Sufficiently Active (10/14/2022)   Exercise Vital Sign    Days of Exercise per Week: 7 days    Minutes of Exercise per Session: 30 min  Stress: No Stress Concern Present (10/14/2022)   [employer] of Occupational Health - Occupational Stress Questionnaire    Feeling of Stress : Not at all  Social Connections: Socially Integrated (10/14/2022)   Social Connection and Isolation Panel [NHANES]    Frequency of Communication with Friends and Family: More than three times a week    Frequency of Social Gatherings with Friends and Family: More than three times a week    Attends Religious Services: More than 4 times per year    Active Member of [employer] or Organizations: Yes    Attends Engineer, structural: More than 4 times per year    Marital Status: Married     Family History: The patient's family history includes Cancer in her mother; Colon cancer (age of onset: 84) in her maternal aunt; Diabetes in her daughter and mother; Heart attack (age of onset: 41) in her father; Heart disease in her father; Hypertension in her father; Ovarian cancer (age of onset: 55) in her sister; Stroke in her father; Thyroid disease in her mother and sister. There is no history of Breast cancer.  ROS:   Please see the history of present illness.     All other systems reviewed and are negative.  EKGs/Labs/Other Studies Reviewed:    The following studies were reviewed today:  Echo 11/2020 1. Left ventricular ejection fraction, by estimation, is 55 to 60%. The  left ventricle has normal function. The left ventricle has no regional  wall motion abnormalities. Left ventricular diastolic parameters are  consistent with Grade I diastolic  dysfunction (impaired relaxation).   2. Right  ventricular systolic function is normal. The right ventricular  size is normal. There is normal pulmonary artery systolic pressure.   3. The mitral valve is abnormal. Mild mitral valve regurgitation. No  evidence of mitral stenosis. There is mild late systolic prolapse of  posterior leaflet of the mitral valve.   4. Tricuspid valve regurgitation is moderate.   5. The aortic valve is tricuspid. There is mild thickening of the aortic  valve. Aortic valve regurgitation is trivial. Mild aortic valve sclerosis  is present, with no evidence of aortic valve stenosis.   6. The inferior vena cava is normal in size with greater than 50%  respiratory variability, suggesting right atrial pressure of 3 mmHg.   Myoview lexiscan 2022 Narrative & Impression  T wave inversion was noted during stress in the III, aVF, V5, V6 and II leads. There was no ST segment deviation noted during stress. The study is normal. This is a low risk study. The left ventricular ejection fraction is normal visually although calculated value is 43%. recommend correlation with echo There is no evidence for ischemia      EKG:  EKG is ordered today.  The ekg ordered today demonstrates NSR 64bpm, nonspecific ST/t wave changes  Recent Labs: 06/25/2022: ALT 19; BUN 9; Creatinine, Ser 0.85; Hemoglobin 12.8; Platelets 234.0; Potassium 3.9; Sodium 141; TSH 1.44  Recent Lipid Panel    Component Value Date/Time   CHOL 147 06/25/2022 1004   CHOL 161 03/15/2021 1009   TRIG 78.0 06/25/2022 1004   HDL 69.80 06/25/2022 1004   HDL 67 03/15/2021 1009   CHOLHDL 2 06/25/2022 1004   VLDL 15.6 06/25/2022 1004   LDLCALC 61 06/25/2022 1004   LDLCALC 82 03/15/2021 1009    Physical Exam:    VS:  BP 116/70 (BP Location: Left Arm, Patient Position: Sitting, Cuff Size: Normal)   Pulse 64   Ht 5\' 3"  (1.6 m)   Wt 116 lb 12.8 oz (53 kg)   SpO2 98%   BMI 20.69 kg/m  Wt Readings from Last 3 Encounters:  04/22/23 116 lb 12.8 oz (53 kg)   04/07/23 112 lb (50.8 kg)  10/14/22 123 lb (55.8 kg)     GEN:  Well nourished, well developed in no acute distress HEENT: Normal NECK: No JVD; No carotid bruits LYMPHATICS: No lymphadenopathy CARDIAC: RRR, + murmur,  norubs, gallops RESPIRATORY:  Clear to auscultation without rales, wheezing or rhonchi  ABDOMEN: Soft, non-tender, non-distended MUSCULOSKELETAL:  No edema; No deformity  SKIN: Warm and dry NEUROLOGIC:  Alert and oriented x 3 PSYCHIATRIC:  Normal affect   ASSESSMENT:    1. Chronic diastolic heart failure (HCC)   2. Valvular heart disease   3. OSA (obstructive sleep apnea)   4. Hyperlipidemia, unspecified hyperlipidemia type   5. Bilateral carotid artery disease, unspecified type (HCC)   6. Palpitation    PLAN:    In order of problems listed above:  HFpEF Valvular disease Prior echo showed LVEF 55-60%, no wMA, G1DD, mild MR, mild late systolic prolapse of posterior leaflet of mitral valve, moderate TR, mild thickening of the aortic valve. She is euvolemic on exam. She no significant DOE. I will update an echo.    OSA S/p Inspire implant followed by pulmonology.   HLD Total chol 147, TG 78, HDL 69, LDL 61 in 06/2022. Continue Crestor 40mg  daily.   Carotid artery disease No bruit on exam. Conitnue ASA and statin therapy.   Palpitations She reports palpitations worse at night. EKG today shows NSR. Exam is benign. I will order a 1 week heart monitor.   Disposition: Follow up in 1 year(s) with MD/APP    Signed, Zaylynn Rickett David Stall, PA-C  04/22/2023 11:22 AM    [facility]

## 2023-04-22 NOTE — Patient Instructions (Signed)
Medication Instructions:  Your physician recommends that you continue on your current medications as directed. Please refer to the Current Medication list given to you today.   *If you need a refill on your cardiac medications before your next appointment, please call your pharmacy*   Lab Work: No labs ordered today    Testing/Procedures: Your physician has requested that you have an echocardiogram. Echocardiography is a painless test that uses sound waves to create images of your heart. It provides your doctor with information about the size and shape of your heart and how well your heart's chambers and valves are working.   You may receive an ultrasound enhancing agent through an IV if needed to better visualize your heart during the echo. This procedure takes approximately one hour.  There are no restrictions for this procedure.  This will take place at [address] [facility]) [address], [place] [address]  Please note: We ask at that you not bring children with you during ultrasound (echo/ vascular) testing. Due to room size and safety concerns, children are not allowed in the ultrasound rooms during exams. Our front office staff cannot provide observation of children in our lobby area while testing is being conducted. An adult accompanying a patient to their appointment will only be allowed in the ultrasound room at the discretion of the ultrasound technician under special circumstances. We apologize for any inconvenience.   Your physician has recommended that you wear a 7 day Zio monitor.   This monitor is a medical device that records the heart's electrical activity. Doctors most often use these monitors to diagnose arrhythmias. Arrhythmias are problems with the speed or rhythm of the heartbeat. The monitor is a small device applied to your chest. You can wear one while you do your normal daily activities. While wearing this monitor if you have any symptoms to push the  button and record what you felt. Once you have worn this monitor for the period of time provider prescribed (Usually 14 days), you will return the monitor device in the postage paid box. Once it is returned they will download the data collected and provide [place] with a report which the provider will then review and we will call you with those results. Important tips:  Avoid showering during the first 24 hours of wearing the monitor. Avoid excessive sweating to help maximize wear time. Do not submerge the device, no hot tubs, and no swimming pools. Keep any lotions or oils away from the patch. After 24 hours you may shower with the patch on. Take brief showers with your back facing the shower head.  Do not remove patch once it has been placed because that will interrupt data and decrease adhesive wear time. Push the button when you have any symptoms and write down what you were feeling. Once you have completed wearing your monitor, remove and place into box which has postage paid and place in your outgoing mailbox.  If for some reason you have misplaced your box then call our office and we can provide another box and/or mail it off for you.      Follow-Up: At [facility], you and your health needs are our priority.  As part of our continuing mission to provide you with exceptional heart care, we have created designated Provider Care Teams.  These Care Teams include your primary Cardiologist (physician) and Advanced Practice Providers (APPs -  Physician Assistants and Nurse Practitioners) who all work together to provide you with  the care you need, when you need it.  We recommend signing up for the patient portal called "MyChart".  Sign up information is provided on this After Visit Summary.  MyChart is used to connect with patients for Virtual Visits (Telemedicine).  Patients are able to view lab/test results, encounter notes, upcoming appointments, etc.  Non-urgent messages can be sent to  your provider as well.   To learn more about what you can do with MyChart, go to [email or web address].    Your next appointment:   1 year(s)  Provider:   Terrilee Croak, PA-C

## 2023-04-25 DIAGNOSIS — R002 Palpitations: Secondary | ICD-10-CM | POA: Diagnosis not present

## 2023-05-08 DIAGNOSIS — R002 Palpitations: Secondary | ICD-10-CM | POA: Diagnosis not present

## 2023-05-15 ENCOUNTER — Other Ambulatory Visit: Payer: Self-pay | Admitting: Medical

## 2023-05-15 ENCOUNTER — Ambulatory Visit: Payer: Medicare Other | Attending: Medical

## 2023-05-15 DIAGNOSIS — I38 Endocarditis, valve unspecified: Secondary | ICD-10-CM | POA: Diagnosis not present

## 2023-05-15 DIAGNOSIS — I5032 Chronic diastolic (congestive) heart failure: Secondary | ICD-10-CM | POA: Diagnosis not present

## 2023-05-15 DIAGNOSIS — I779 Disorder of arteries and arterioles, unspecified: Secondary | ICD-10-CM

## 2023-05-15 DIAGNOSIS — E785 Hyperlipidemia, unspecified: Secondary | ICD-10-CM

## 2023-05-15 DIAGNOSIS — R002 Palpitations: Secondary | ICD-10-CM

## 2023-05-15 DIAGNOSIS — G4733 Obstructive sleep apnea (adult) (pediatric): Secondary | ICD-10-CM

## 2023-05-16 LAB — ECHOCARDIOGRAM COMPLETE
Area-P 1/2: 3.12 cm2
S' Lateral: 3 cm

## 2023-05-21 ENCOUNTER — Other Ambulatory Visit: Payer: Self-pay

## 2023-05-21 DIAGNOSIS — I471 Supraventricular tachycardia, unspecified: Secondary | ICD-10-CM

## 2023-05-21 MED ORDER — DILTIAZEM HCL ER COATED BEADS 120 MG PO CP24: 120.0000 mg | ORAL_CAPSULE | Freq: Every day | ORAL | 3 refills | Status: DC

## 2023-06-23 NOTE — Progress Notes (Unsigned)
Electrophysiology Office Note:   Date:  06/24/2023  ID:  Sandra Horton, DOB [birth year], MRN [id number]  Primary Cardiologist: None Primary Heart Failure: None Electrophysiologist: Nobie Putnam, MD      History of Present Illness:   Sandra Horton is a 73 y.o. female with h/o HLD, depression, anxiety, OSA s/p Inspire implant, aortic atherosclerosis on CT who is seen today for evaluation of SVT at the request of Cadence Furth, [place].  Patient complained of palpitations during recent visit with general cardiology on 04/22/2023. These were primarily occurring at night while laying in bed. She wore a ZIO monitor which showed a percent PAC burden and frequent episodes of short nonsustained SVT.  She was started on diltiazem. She has continued to have episodes of palpitations since starting diltiazem. Sometimes accompanied by dizziness. No syncope. No other new or acute complaints.  Review of systems complete and found to be negative unless listed in HPI.   EP Information / Studies Reviewed:    EKG is ordered today. Personal review as below.  EKG Interpretation Date/Time:  Tuesday June 24 2023 08:22:18 EST Ventricular Rate:  57 PR Interval:  176 QRS Duration:  76 QT Interval:  410 QTC Calculation: 399 R Axis:   56  Text Interpretation: Sinus bradycardia When compared with ECG of 22-Apr-2023 10:48, No significant change was found Confirmed by Nobie Putnam [phone]) on 06/24/2023 8:27:07 AM   Echo 05/15/2023: Mildly decreased LV systolic function, LVEF 50 to 21%.  Grade 1 diastolic dysfunction. Normal RV size and function. Moderate MR.  Moderate to severe TR.  Mild AI.  Zio monitor 05/09/2023:   The patient was monitored for 7 days, 1 hour.   The predominant rhythm was sinus with an average rate of 71 bpm (range 53-140 bpm in sinus).   There were frequent PAC's (8% PAC burden) and rare PVC's.   2038 supraventricular runs were observed, lasting up to 16.5 seconds with a maximum rate of 210  bpm.   No sustained arrhythmia or prolonged pause was seen.   Patient triggered events correspond to sinus rhythm, PSVT, PAC's, and PVC's.   Sinus rhythm with frequent PAC's and numerous episodes of brief PSVT, as detailed above.  Physical Exam:   VS:  BP 122/68 (BP Location: Left Arm, Patient Position: Sitting, Cuff Size: Normal)   Pulse (!) 57   Ht 5\' 3"  (1.6 m)   Wt 116 lb 6.4 oz (52.8 kg)   SpO2 93%   BMI 20.62 kg/m    Wt Readings from Last 3 Encounters:  06/24/23 116 lb 6.4 oz (52.8 kg)  04/22/23 116 lb 12.8 oz (53 kg)  04/07/23 112 lb (50.8 kg)     GEN: Well nourished, well developed in no acute distress NECK: No JVD CARDIAC: Normal rate and regular rhythm. RESPIRATORY:  Clear to auscultation without rales, wheezing or rhonchi  ABDOMEN: Soft, non-tender, non-distended EXTREMITIES:  No edema; No deformity   ASSESSMENT AND PLAN:   Sandra Horton is a 73 y.o. female with h/o HLD, depression, anxiety, OSA s/p Inspire implant, aortic atherosclerosis on CT who is seen today for evaluation of SVT at the request of Cadence Furth, [place].  #SVT:   #Palpitations:  -Frequent PACs and frequent non-sustained SVT episodes on Zio. -Still occurring despite diltiazem. Increasing diltiazem is limited by low resting HR.  Continue current dosing.  -Patient is not interested in EP study and ablation. Will start flecainide 50mg  twice daily. Normal LVEF 50-55%. Normal perfusion study in 2022. Exercise  nuclear stress test to be done in roughly 1 week.   Follow up with EP APP in 3 months.  Signed, Nobie Putnam, MD

## 2023-06-24 ENCOUNTER — Ambulatory Visit: Payer: Medicare Other | Attending: Cardiology | Admitting: Cardiology

## 2023-06-24 ENCOUNTER — Encounter: Payer: Self-pay | Admitting: Cardiology

## 2023-06-24 VITALS — BP 122/68 | HR 57 | Ht 63.0 in | Wt 116.4 lb

## 2023-06-24 DIAGNOSIS — R002 Palpitations: Secondary | ICD-10-CM | POA: Diagnosis not present

## 2023-06-24 DIAGNOSIS — I471 Supraventricular tachycardia, unspecified: Secondary | ICD-10-CM | POA: Insufficient documentation

## 2023-06-24 DIAGNOSIS — R9431 Abnormal electrocardiogram [ECG] [EKG]: Secondary | ICD-10-CM | POA: Insufficient documentation

## 2023-06-24 MED ORDER — FLECAINIDE ACETATE 50 MG PO TABS: 50.0000 mg | ORAL_TABLET | Freq: Two times a day (BID) | ORAL | 3 refills | Status: DC

## 2023-06-24 NOTE — Patient Instructions (Addendum)
Medication Instructions:  Your physician has recommended you make the following change in your medication:  1) START taking flecainide 50 mg twice daily  *If you need a refill on your cardiac medications before your next appointment, please call your pharmacy*  Testing/Procedures: IN 7-10days: Your physician has requested that you have en exercise stress myoview. For further information please visit [email or web address] Please follow instruction sheet, as given.   Follow-Up: At [facility], you and your health needs are our priority.  As part of our continuing mission to provide you with exceptional heart care, we have created designated Provider Care Teams.  These Care Teams include your primary Cardiologist (physician) and Advanced Practice Providers (APPs -  Physician Assistants and Nurse Practitioners) who all work together to provide you with the care you need, when you need it.   Your next appointment:   3 months  Provider:   Sherie Don, NP

## 2023-06-26 ENCOUNTER — Encounter: Payer: Self-pay | Admitting: Nurse Practitioner

## 2023-06-26 ENCOUNTER — Ambulatory Visit: Payer: Medicare Other | Admitting: Nurse Practitioner

## 2023-06-26 VITALS — BP 100/60 | HR 58 | Temp 97.9°F | Ht 63.0 in | Wt 116.2 lb

## 2023-06-26 DIAGNOSIS — E785 Hyperlipidemia, unspecified: Secondary | ICD-10-CM | POA: Diagnosis not present

## 2023-06-26 DIAGNOSIS — G4733 Obstructive sleep apnea (adult) (pediatric): Secondary | ICD-10-CM

## 2023-06-26 DIAGNOSIS — E039 Hypothyroidism, unspecified: Secondary | ICD-10-CM | POA: Diagnosis not present

## 2023-06-26 DIAGNOSIS — E559 Vitamin D deficiency, unspecified: Secondary | ICD-10-CM

## 2023-06-26 DIAGNOSIS — I471 Supraventricular tachycardia, unspecified: Secondary | ICD-10-CM

## 2023-06-26 DIAGNOSIS — L309 Dermatitis, unspecified: Secondary | ICD-10-CM | POA: Diagnosis not present

## 2023-06-26 DIAGNOSIS — F3342 Major depressive disorder, recurrent, in full remission: Secondary | ICD-10-CM | POA: Diagnosis not present

## 2023-06-26 DIAGNOSIS — Z1231 Encounter for screening mammogram for malignant neoplasm of breast: Secondary | ICD-10-CM

## 2023-06-26 LAB — LIPID PANEL
Cholesterol: 160 mg/dL (ref 0–200)
HDL: 79.6 mg/dL (ref >39.00)
LDL Cholesterol: 65 mg/dL (ref 0–99)
NonHDL: 80.06
Total CHOL/HDL Ratio: 2
Triglycerides: 74 mg/dL (ref 0.0–149.0)
VLDL: 14.8 mg/dL (ref 0.0–40.0)

## 2023-06-26 LAB — CBC WITH DIFFERENTIAL/PLATELET
Basophils Absolute: 0 10*3/uL (ref 0.0–0.1)
Basophils Relative: 0.6 % (ref 0.0–3.0)
Eosinophils Absolute: 0.1 10*3/uL (ref 0.0–0.7)
Eosinophils Relative: 1 % (ref 0.0–5.0)
HCT: 38.4 % (ref 36.0–46.0)
Hemoglobin: 12.9 g/dL (ref 12.0–15.0)
Lymphocytes Relative: 18.6 % (ref 12.0–46.0)
Lymphs Abs: 1.1 10*3/uL (ref 0.7–4.0)
MCHC: 33.4 g/dL (ref 30.0–36.0)
MCV: 99 fL (ref 78.0–100.0)
Monocytes Absolute: 0.5 10*3/uL (ref 0.1–1.0)
Monocytes Relative: 8 % (ref 3.0–12.0)
Neutro Abs: 4.1 10*3/uL (ref 1.4–7.7)
Neutrophils Relative %: 71.8 % (ref 43.0–77.0)
Platelets: 249 10*3/uL (ref 150.0–400.0)
RBC: 3.88 Mil/uL (ref 3.87–5.11)
RDW: 12.8 % (ref 11.5–15.5)
WBC: 5.7 10*3/uL (ref 4.0–10.5)

## 2023-06-26 LAB — COMPREHENSIVE METABOLIC PANEL WITH GFR
ALT: 22 U/L (ref 0–35)
AST: 23 U/L (ref 0–37)
Albumin: 4.5 g/dL (ref 3.5–5.2)
Alkaline Phosphatase: 65 U/L (ref 39–117)
BUN: 15 mg/dL (ref 6–23)
CO2: 28 meq/L (ref 19–32)
Calcium: 9.5 mg/dL (ref 8.4–10.5)
Chloride: 99 meq/L (ref 96–112)
Creatinine, Ser: 0.79 mg/dL (ref 0.40–1.20)
GFR: 73.98 mL/min (ref >60.00)
Glucose, Bld: 89 mg/dL (ref 70–99)
Potassium: 3.9 meq/L (ref 3.5–5.1)
Sodium: 136 meq/L (ref 135–145)
Total Bilirubin: 0.5 mg/dL (ref 0.2–1.2)
Total Protein: 7.3 g/dL (ref 6.0–8.3)

## 2023-06-26 LAB — TSH: TSH: 6.96 u[IU]/mL — ABNORMAL HIGH (ref 0.35–5.50)

## 2023-06-26 LAB — VITAMIN D 25 HYDROXY (VIT D DEFICIENCY, FRACTURES): VITD: 71.96 ng/mL (ref 30.00–100.00)

## 2023-06-26 MED ORDER — TRIAMCINOLONE ACETONIDE 0.1 % EX CREA: 1.0000 | TOPICAL_CREAM | Freq: Two times a day (BID) | CUTANEOUS | 1 refills | Status: DC | PRN

## 2023-06-26 NOTE — Assessment & Plan Note (Signed)
Chronic. Symptoms well managed on Paxil 20mg  daily. She will continue Paxil. Encouraged to contact if worsening symptoms, unusual behavior changes or suicidal thoughts occur.

## 2023-06-26 NOTE — Assessment & Plan Note (Signed)
Significant improvement is reported with the [facility] Iii device. Continue current management. Follow up with Pulmonology as scheduled.

## 2023-06-26 NOTE — Assessment & Plan Note (Signed)
Recent palpitations have been addressed with Diltiazem and Flecainide. Continue these medications as prescribed by Cardiology. A nuclear stress test is scheduled for next week. Follow up with Cardiology as scheduled.

## 2023-06-26 NOTE — Assessment & Plan Note (Signed)
An itchy rash on the back has improved since onset. We will trial Kenalog cream twice daily for symptomatic relief. Advised to return if symptoms persist or are worsening.

## 2023-06-26 NOTE — Patient Instructions (Signed)
YOUR MAMMOGRAM IS DUE, PLEASE CALL AND GET THIS SCHEDULED! [facility] - call [phone]    

## 2023-06-26 NOTE — Assessment & Plan Note (Addendum)
Chronic. Condition remains stable on Levothyroxine daily, despite a recent pharmacy recall notice. Continue. Check thyroid function tests today to ensure therapeutic levels.

## 2023-06-26 NOTE — Assessment & Plan Note (Signed)
Chronic. Well controlled on Crestor 40 mg daily. Continue Crestor. Will check fasting lipids today.

## 2023-06-26 NOTE — Progress Notes (Signed)
Bethanie Dicker, NP-C Phone: [phone]  Sandra Horton is a 73 y.o. female who presents today for follow up.   Discussed the use of AI scribe software for clinical note transcription with the patient, who gave verbal consent to proceed.  History of Present Illness   The patient, with a history of palpitations, recently started on diltiazem. She continued to have palpitations since starting the medication and was started on Flecainide 2 days ago by cardiology. She has an exercise nuclear stress test scheduled next week.   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 She recently developed a new rash on her back, which started as an itchy spot and spread over a few days. The rash was associated with a burning sensation. Denies any new exposures or changes in soaps, lotions, or detergents.   The patient also has an Inspire implant for sleep apnea, which she reports has significantly improved her sleep quality. She has been taking levothyroxine for hypothyroidism for a long time, and recently received a recall notice from her pharmacy regarding this medication.  The patient also reports occasional headaches, which she attributes to her neck issues, and occasional dizziness, which she believes is related to her cardiac issues. She has been managing constipation with Miralax. She reports good appetite and recent weight loss, which she attributes to increased physical activity and dietary changes.      Social History   Tobacco Use  Smoking Status Never  Smokeless Tobacco Never    Current Outpatient Medications on File Prior to Visit  Medication Sig Dispense Refill   Ascorbic Acid (VITAMIN C) 500 MG CHEW Chew 1 tablet by mouth 2 (two) times daily.     aspirin 81 MG tablet Take 81 mg by mouth daily.     Calcium 500 MG tablet Take 500 mg by mouth in the morning and at bedtime.     cholecalciferol (VITAMIN D3) 25 MCG (1000 UNIT) tablet Take 1,000 Units  by mouth daily.     diltiazem (CARDIZEM CD) 120 MG 24 hr capsule Take 1 capsule (120 mg total) by mouth daily. 90 capsule 3   flecainide (TAMBOCOR) 50 MG tablet Take 1 tablet (50 mg total) by mouth 2 (two) times daily. 180 tablet 3   Ibuprofen (MOTRIN PO) Take by mouth as needed.     levothyroxine (SYNTHROID) 88 MCG tablet Take 1 tablet (88 mcg total) by mouth daily before breakfast. Empty stomach 90 tablet 3   magnesium chloride (SLOW-MAG) 64 MG TBEC SR tablet Take 1 tablet by mouth daily.     Meclizine HCl (ANTIVERT PO) Take by mouth as needed.     Multiple Vitamins-Minerals (MULTIVITAMIN ADULT PO) Take 1 tablet by mouth daily.     omeprazole (PRILOSEC) 40 MG capsule Take 1 capsule (40 mg total) by mouth daily. Wait 3-4 hours after thyroid med. Take 30 minutes before food lunch/dinner 90 capsule 3   PARoxetine (PAXIL) 20 MG tablet Take 1 tablet (20 mg total) by mouth daily. 90 tablet 3   polyethylene glycol powder (GLYCOLAX/MIRALAX) 17 GM/SCOOP powder Take 17 g by mouth daily as needed. 850 g 11   rosuvastatin (CRESTOR) 40 MG tablet Take 1 tablet (40 mg total) by mouth daily. 90 tablet 3   valACYclovir (VALTREX) 500 MG tablet TAKE 1 TAB DAILY AS PREVENTIVE OR TWICE DAILY FOR 3 DAYS AS NEEDED FOR SYMPTOM FLARE 180 tablet 1   No current facility-administered medications on file prior to visit.  ROS see history of present illness  Objective  Physical Exam Vitals:   06/26/23 0808  BP: 100/60  Pulse: (!) 58  Temp: 97.9 F (36.6 C)  SpO2: 99%    BP Readings from Last 3 Encounters:  06/26/23 100/60  06/24/23 122/68  04/22/23 116/70   Wt Readings from Last 3 Encounters:  06/26/23 116 lb 3.2 oz (52.7 kg)  06/24/23 116 lb 6.4 oz (52.8 kg)  04/22/23 116 lb 12.8 oz (53 kg)    Physical Exam Constitutional:      General: She is not in acute distress.    Appearance: Normal appearance.  HENT:     Head: Normocephalic.     Right Ear: Tympanic membrane normal.     Left Ear:  Tympanic membrane normal.     Nose: Nose normal.     Mouth/Throat:     Mouth: Mucous membranes are moist.     Pharynx: Oropharynx is clear.  Eyes:     Conjunctiva/sclera: Conjunctivae normal.     Pupils: Pupils are equal, round, and reactive to light.  Neck:     Thyroid: No thyromegaly.  Cardiovascular:     Rate and Rhythm: Normal rate and regular rhythm.     Heart sounds: Normal heart sounds.  Pulmonary:     Effort: Pulmonary effort is normal.     Breath sounds: Normal breath sounds.  Abdominal:     General: Abdomen is flat. Bowel sounds are normal.     Palpations: Abdomen is soft. There is no mass.     Tenderness: There is no abdominal tenderness.  Musculoskeletal:        General: Normal range of motion.  Lymphadenopathy:     Cervical: No cervical adenopathy.  Skin:    General: Skin is warm and dry.     Findings: Rash (low back, mild erythma, maculopapular) present.       Neurological:     General: No focal deficit present.     Mental Status: She is alert.  Psychiatric:        Mood and Affect: Mood normal.        Behavior: Behavior normal.    Assessment/Plan: Please see individual problem list.  Dermatitis Assessment & Plan: An itchy rash on the back has improved since onset. We will trial Kenalog cream twice daily for symptomatic relief. Advised to return if symptoms persist or are worsening.   Orders: -     Triamcinolone Acetonide; Apply 1 Application topically 2 (two) times daily as needed.  Dispense: 30 g; Refill: 1  SVT (supraventricular tachycardia) (HCC) Assessment & Plan: Recent palpitations have been addressed with Diltiazem and Flecainide. Continue these medications as prescribed by Cardiology. A nuclear stress test is scheduled for next week. Follow up with Cardiology as scheduled.   Orders: -     CBC with Differential/Platelet  Hypothyroidism, unspecified type Assessment & Plan: Chronic. Condition remains stable on Levothyroxine daily,  despite a recent pharmacy recall notice. Continue. Check thyroid function tests today to ensure therapeutic levels.  Orders: -     TSH  OSA (obstructive sleep apnea) Assessment & Plan: Significant improvement is reported with the Inspire device. Continue current management. Follow up with Pulmonology as scheduled.    Hyperlipidemia, unspecified hyperlipidemia type Assessment & Plan: Chronic. Well controlled on Crestor 40 mg daily. Continue Crestor. Will check fasting lipids today.   Orders: -     Comprehensive metabolic panel -     Lipid panel  Depression, major, recurrent, in  complete remission [facility]) Assessment & Plan: Chronic. Symptoms well managed on Paxil 20mg  daily. She will continue Paxil. Encouraged to contact if worsening symptoms, unusual behavior changes or suicidal thoughts occur.   Vitamin D deficiency -     VITAMIN D 25 Hydroxy (Vit-D Deficiency, Fractures)  Screening mammogram for breast cancer -     3D Screening Mammogram, Left and Right; Future    Return in about 1 year (around 06/25/2024) for Follow up, sooner as needed.   Bethanie Dicker, NP-C [facility]

## 2023-06-27 ENCOUNTER — Other Ambulatory Visit: Payer: Self-pay

## 2023-06-27 ENCOUNTER — Encounter: Payer: Self-pay | Admitting: Nurse Practitioner

## 2023-06-27 ENCOUNTER — Other Ambulatory Visit: Payer: Self-pay | Admitting: Nurse Practitioner

## 2023-06-27 DIAGNOSIS — E039 Hypothyroidism, unspecified: Secondary | ICD-10-CM

## 2023-06-27 MED ORDER — LEVOTHYROXINE SODIUM 100 MCG PO TABS: 100.0000 ug | ORAL_TABLET | Freq: Every day | ORAL | 0 refills | Status: DC

## 2023-07-01 ENCOUNTER — Encounter
Admission: RE | Admit: 2023-07-01 | Discharge: 2023-07-01 | Disposition: A | Discharge status: Home / Self Care | Payer: Medicare Other | Source: Ambulatory Visit | Attending: Cardiology | Admitting: Cardiology

## 2023-07-01 ENCOUNTER — Other Ambulatory Visit (HOSPITAL_COMMUNITY): Payer: Medicare Other

## 2023-07-01 DIAGNOSIS — R9431 Abnormal electrocardiogram [ECG] [EKG]: Secondary | ICD-10-CM | POA: Insufficient documentation

## 2023-07-01 DIAGNOSIS — I471 Supraventricular tachycardia, unspecified: Secondary | ICD-10-CM | POA: Insufficient documentation

## 2023-07-01 DIAGNOSIS — R002 Palpitations: Secondary | ICD-10-CM | POA: Insufficient documentation

## 2023-07-01 MED ORDER — TECHNETIUM TC 99M TETROFOSMIN IV KIT
32.4700 | PACK | Freq: Once | INTRAVENOUS | Status: AC | PRN
  Administered 2023-07-01: 32.47 via INTRAVENOUS

## 2023-07-01 MED ORDER — TECHNETIUM TC 99M TETROFOSMIN IV KIT
10.0000 | PACK | Freq: Once | INTRAVENOUS | Status: AC | PRN
  Administered 2023-07-01: 10.09 via INTRAVENOUS

## 2023-07-02 LAB — NM MYOCAR MULTI W/SPECT W/WALL MOTION / EF
Angina Index: 0
Duke Treadmill Score: 6
Estimated workload: 7
Exercise duration (min): 6 min
Exercise duration (sec): 0 s
LV dias vol: 43 mL (ref 46–106)
LV sys vol: 12 mL
MPHR: 147 {beats}/min
Nuc Stress EF: 72 %
Peak HR: 136 {beats}/min
Percent HR: 92 %
Rest HR: 67 {beats}/min
Rest Nuclear Isotope Dose: 10.1 mCi
SDS: 0
SRS: 2
SSS: 0
ST Depression (mm): 1 mm
Stress Nuclear Isotope Dose: 32.5 mCi
TID: 0.9

## 2023-08-05 ENCOUNTER — Ambulatory Visit: Payer: Medicare Other | Admitting: Adult Health

## 2023-08-05 ENCOUNTER — Encounter

## 2023-08-12 ENCOUNTER — Other Ambulatory Visit (INDEPENDENT_AMBULATORY_CARE_PROVIDER_SITE_OTHER): Payer: Medicare Other

## 2023-08-12 ENCOUNTER — Other Ambulatory Visit: Payer: Self-pay

## 2023-08-12 ENCOUNTER — Encounter: Payer: Self-pay | Admitting: Nurse Practitioner

## 2023-08-12 DIAGNOSIS — E039 Hypothyroidism, unspecified: Secondary | ICD-10-CM | POA: Diagnosis not present

## 2023-08-12 LAB — TSH: TSH: 0.32 u[IU]/mL — ABNORMAL LOW (ref 0.35–5.50)

## 2023-08-20 ENCOUNTER — Other Ambulatory Visit: Payer: Self-pay | Admitting: Nurse Practitioner

## 2023-08-20 DIAGNOSIS — E039 Hypothyroidism, unspecified: Secondary | ICD-10-CM

## 2023-08-27 ENCOUNTER — Ambulatory Visit: Admitting: Sleep Medicine

## 2023-08-27 ENCOUNTER — Encounter: Payer: Self-pay | Admitting: Sleep Medicine

## 2023-08-27 VITALS — BP 96/60 | HR 70 | Temp 97.6°F | Ht 63.0 in | Wt 118.2 lb

## 2023-08-27 DIAGNOSIS — G4733 Obstructive sleep apnea (adult) (pediatric): Secondary | ICD-10-CM

## 2023-08-27 NOTE — Progress Notes (Signed)
 Name:Sandra Horton MRN: [id number] DOB: 24-Dec-1950   CHIEF COMPLAINT:  Inspire F/U   HISTORY OF PRESENT ILLNESS:  Sandra Horton is a 73 y.o. w/ a h/o OSA, hyperlipidemia, SVT, hypothyroidism, GERD and anxiety who presents for Inspire F/U. Patient recently underwent home sleep testing with [facility] therapy which revealed that OSA is well controlled on 1.3 V, residual AHI found to be 1.9. Patient reports feeling more refreshed upon awakening with Inspire therapy. Patient reports using Inspire therapy every night for 7 hours and 40 mins. Denies any issues at this time.    EPWORTH SLEEP SCORE 10    01/11/2021    8:59 AM  Results of the Epworth flowsheet  Sitting and reading 2  Watching TV 2  Sitting, inactive in a public place (e.g. a theatre or a meeting) 0  As a passenger in a car for an hour without a break 3  Lying down to rest in the afternoon when circumstances permit 3  Sitting and talking to someone 0  Sitting quietly after a lunch without alcohol 0  In a car, while stopped for a few minutes in traffic 0  Total score 10    PAST MEDICAL HISTORY :   has a past medical history of Allergy, Anti-cardiolipin antibody positive, Anxiety, Arthritis, BRCA negative (2016), Depression, Family history of ovarian cancer (2016), GERD (gastroesophageal reflux disease), Herpes simplex, Hyperlipidemia, Hypothyroidism, Osteopenia, Rosacea, Ruptured disc, cervical, Sleep apnea, Vertigo, Vitamin D deficiency, and Wears dentures.  has a past surgical history that includes Colonoscopy; Dilation and curettage of uterus (30 years ago); Cervical biopsy w/ loop electrode excision; Eye surgery; Colonoscopy with propofol (N/A, 03/30/2018); polypectomy (N/A, 03/30/2018); Drug induced endoscopy (N/A, 09/25/2021); Implantation of hypoglossal nerve stimulator (Right, 02/26/2022); Colonoscopy with propofol (N/A, 04/07/2023); and Skin biopsy (2024). Prior to Admission medications   Medication Sig Start Date  End Date Taking? Authorizing Provider  Ascorbic Acid (VITAMIN C) 500 MG CHEW Chew 1 tablet by mouth 2 (two) times daily. 06/04/19  Yes [provider]  aspirin 81 MG tablet Take 81 mg by mouth daily.   Yes [provider]  Calcium 500 MG tablet Take 500 mg by mouth in the morning and at bedtime. 09/20/19  Yes [provider]  cholecalciferol (VITAMIN D3) 25 MCG (1000 UNIT) tablet Take 1,000 Units by mouth daily.   Yes [provider]  flecainide (TAMBOCOR) 50 MG tablet Take 1 tablet (50 mg total) by mouth 2 (two) times daily. 06/24/23  Yes Nobie Putnam, MD  Ibuprofen (MOTRIN PO) Take by mouth as needed.   Yes [provider]  levothyroxine (SYNTHROID) 100 MCG tablet Take 1 tablet (100 mcg total) by mouth daily before breakfast. 06/27/23  Yes Bethanie Dicker, NP  magnesium chloride (SLOW-MAG) 64 MG TBEC SR tablet Take 1 tablet by mouth daily.   Yes [provider]  Meclizine HCl (ANTIVERT PO) Take by mouth as needed.   Yes [provider]  Multiple Vitamins-Minerals (MULTIVITAMIN ADULT PO) Take 1 tablet by mouth daily. 06/03/18  Yes [provider]  omeprazole (PRILOSEC) 40 MG capsule Take 1 capsule (40 mg total) by mouth daily. Wait 3-4 hours after thyroid med. Take 30 minutes before food lunch/dinner 09/12/22  Yes Bethanie Dicker, NP  PARoxetine (PAXIL) 20 MG tablet Take 1 tablet (20 mg total) by mouth daily. 09/12/22  Yes Bethanie Dicker, NP  polyethylene glycol powder (GLYCOLAX/MIRALAX) 17 GM/SCOOP powder Take 17 g by mouth daily as needed. 09/12/22  Yes Bethanie Dicker, NP  rosuvastatin (CRESTOR) 40 MG tablet Take 1 tablet (40 mg total) by mouth daily. 09/12/22 09/07/23 Yes Bethanie Dicker, NP  triamcinolone cream (KENALOG) 0.1 % Apply 1 Application topically 2 (two) times daily as needed. 06/26/23  Yes Bethanie Dicker, NP  valACYclovir (VALTREX) 500 MG tablet TAKE 1 TAB DAILY AS PREVENTIVE OR TWICE DAILY FOR 3 DAYS AS NEEDED FOR SYMPTOM FLARE 11/12/22   Yes Copland, Helmut Muster B, PA-C  diltiazem (CARDIZEM CD) 120 MG 24 hr capsule Take 1 capsule (120 mg total) by mouth daily. 05/21/23 08/19/23  Furth, Cadence H, PA-C   Allergies  Allergen Reactions   Penicillin V Potassium Anaphylaxis   Penicillins Anaphylaxis    Other Reaction(s): Penicillin   Tetanus Toxoid Swelling    Localized swelling, redness and pain    FAMILY HISTORY:  family history includes Cancer in her mother; Colon cancer (age of onset: 51) in her maternal aunt; Diabetes in her daughter and mother; Heart attack (age of onset: 26) in her father; Heart disease in her father; Hypertension in her father; Ovarian cancer (age of onset: 73) in her sister; Stroke in her father; Thyroid disease in her mother and sister. SOCIAL HISTORY:  reports that she has never smoked. She has never used smokeless tobacco. She reports that she does not drink alcohol and does not use drugs.   Review of Systems:  Gen:  Denies  fever, sweats, chills weight loss  HEENT: Denies blurred vision, double vision, ear pain, eye pain, hearing loss, nose bleeds, sore throat Cardiac:  No dizziness, chest pain or heaviness, chest tightness,edema, No JVD Resp:   No cough, -sputum production, -shortness of breath,-wheezing, -hemoptysis,  Gi: Denies swallowing difficulty, stomach pain, nausea or vomiting, diarrhea, constipation, bowel incontinence Gu:  Denies bladder incontinence, burning urine Ext:   Denies Joint pain, stiffness or swelling Skin: Denies  skin rash, easy bruising or bleeding or hives Endoc:  Denies polyuria, polydipsia , polyphagia or weight change Psych:   Denies depression, insomnia or hallucinations  Other:  All other systems negative  VITAL SIGNS: BP 96/60 (BP Location: Left Arm, Patient Position: Sitting, Cuff Size: Normal)   Pulse 70   Temp 97.6 F (36.4 C) (Temporal)   Ht 5\' 3"  (1.6 m)   Wt 118 lb 3.2 oz (53.6 kg)   SpO2 98%   BMI 20.94 kg/m    Physical Examination:   General  Appearance: No distress  EYES PERRLA, EOM intact.   NECK Supple, No JVD ORAL Excellent tongue motion, no deviation noted Pulmonary: normal breath sounds, No wheezing.  CardiovascularNormal S1,S2.  No m/r/g.   Abdomen: Benign, Soft, non-tender. Skin:   warm, no rashes, no ecchymosis  Extremities: normal, no cyanosis, clubbing. Neuro:without focal findings,  speech normal  PSYCHIATRIC: Mood, affect within normal limits.   ASSESSMENT AND PLAN  OSA Patient is using and benefiting from [facility] therapy. Inspire device interrogated in office. Changed therapy range to 1.1-1.4 V. Discussed the consequences of untreated sleep apnea. Advised not to drive drowsy for safety of patient and others. Will follow up in 1 year.     SVT Stable, on current management.    Patient  satisfied with Plan of action and management. All questions answered   I spent a total of 46 minutes reviewing chart data, face-to-face evaluation with the patient, counseling and coordination of care as detailed above.    Tempie Hoist, M.D.  Sleep Medicine [facility]

## 2023-08-27 NOTE — Patient Instructions (Signed)
 Continue using Inspire therapy every night, follow up in 1 year.

## 2023-09-12 ENCOUNTER — Other Ambulatory Visit: Payer: Self-pay | Admitting: Nurse Practitioner

## 2023-09-12 DIAGNOSIS — E039 Hypothyroidism, unspecified: Secondary | ICD-10-CM

## 2023-09-12 DIAGNOSIS — K219 Gastro-esophageal reflux disease without esophagitis: Secondary | ICD-10-CM

## 2023-09-12 DIAGNOSIS — F3342 Major depressive disorder, recurrent, in full remission: Secondary | ICD-10-CM

## 2023-09-22 NOTE — Progress Notes (Unsigned)
 Electrophysiology Clinic Note    Date:  09/29/2023  Patient ID:  Sandra Horton, Sandra Horton, Sandra Horton, MRN [id number] PCP:  Bluford Burkitt, NP  Cardiologist:  None Electrophysiologist: Ardeen Kohler, MD  ***refresh  Discussed the use of AI scribe software for clinical note transcription with the patient, who gave verbal consent to proceed.   Patient Profile    Chief Complaint: SVT follow-up  History of Present Illness: Sandra Horton is a 73 y.o. female with PMH notable for SVT, PACs, OSA s/p Inspire, HLD; seen today for Ardeen Kohler, MD for routine electrophysiology followup.   She last saw Dr. Daneil Dunker 06/2023 for evaluation of her SVT, PACs, and palpitations. Uptitration of dilt was limited by resting HR, so he started 50mg  flecainide  BID.   On follow-up today, she says that since starting the flecainide  she has noticed several odd symptoms / sensations. She wakes most mornings with a ringing in her ears, a sensation of something in her throat, and annoying headache. She has these symptoms every morning for 3-4 hours. She did not feel these symptoms when only taking diltiazem . She continues to want to avoid ablation, but is willing to consider it.  Thankfully, she has not had any palpitation episodes since starting flecainide .   She denies chest pain, chest pressure, presyncope or syncope.    Arrhythmia/Device History Flecainide         ROS:  Please see the history of present illness. All other systems are reviewed and otherwise negative.    Physical Exam    VS:  BP 112/62   Pulse 63   Ht 5\' 3"  (1.6 m)   Wt 115 lb 12.8 oz (52.5 kg)   SpO2 99%   BMI 20.51 kg/m  BMI: Body mass index is 20.51 kg/m.  Wt Readings from Last 3 Encounters:  09/29/23 115 lb 12.8 oz (52.5 kg)  08/27/23 118 lb 3.2 oz (53.6 kg)  06/26/23 116 lb 3.2 oz (52.7 kg)     GEN- The patient is well appearing, alert and oriented x 3 today.   Lungs- Clear to ausculation bilaterally, normal work of  breathing.  Heart- Regular rate and rhythm, no murmurs, rubs or gallops Extremities- No peripheral edema, warm, dry    Studies Reviewed   Previous EP, cardiology notes.    EKG is ordered. Personal review of EKG from today shows:    EKG Interpretation Date/Time:  Monday September 29 2023 09:42:02 EDT Ventricular Rate:  63 PR Interval:  186 QRS Duration:  92 QT Interval:  416 QTC Calculation: 425 R Axis:   47  Text Interpretation: Normal sinus rhythm Normal ECG Confirmed by Kaeley Vinje [phone]) on 09/29/2023 9:45:11 AM    07/01/2023 EKG - SB at 57, PR 176, QRS 76, QT 410  NM Myocardial spect, 07/01/2023   Low risk, probably normal exercise myocardial perfusion stress test without evidence of significant ischemia or scar on the perfusion images.   ECG portion of the stress test demonstrates 1 mm of inferolateral ST depressions, which is nonspecific in the setting of normal perfusion images.   Left ventricular systolic function is normal (LVEF greater than 65%).   Mild coronary artery calcification and aortic atherosclerosis are noted on the attenuation correction CT images.  TTE, 05/16/2023  1. Left ventricular ejection fraction, by estimation, is 50 to 55%. Left ventricular ejection fraction by 3D volume is 43 %. The left ventricle has mildly decreased function. The left ventricle demonstrates regional wall motion abnormalities (mid to distal  septal wall hypokinesis). Left ventricular diastolic parameters are  consistent with Grade I diastolic dysfunction (impaired relaxation). The average left ventricular global longitudinal strain is -15.0 %.   2. Right ventricular systolic function is normal. The right ventricular size is normal. There is normal pulmonary artery systolic pressure. The estimated right ventricular systolic pressure is 24.0 mmHg.   3. The mitral valve is normal in structure. Moderate mitral valve regurgitation. No evidence of mitral stenosis.   4. Tricuspid valve  regurgitation is moderate to severe.   5. The aortic valve is tricuspid. Aortic valve regurgitation is mild. No aortic stenosis is present.   6. The inferior vena cava is normal in size with greater than 50% respiratory variability, suggesting right atrial pressure of 3 mmHg.   Long termmonitor, 05/09/2023   The patient was monitored for 7 days, 1 hour.   The predominant rhythm was sinus with an average rate of 71 bpm (range 53-140 bpm in sinus).   There were frequent PAC's (8% PAC burden) and rare PVC's.   2038 supraventricular runs were observed, lasting up to 16.5 seconds with a maximum rate of 210 bpm.   No sustained arrhythmia or prolonged pause was seen.   Patient triggered events correspond to sinus rhythm, PSVT, PAC's, and PVC's    Assessment and Plan     #) SVT #) palpitations EKG with stable intervals on flecainide , though patient requests to stop the medication and consider alternative.  She took flecainide  this AM, will stop today (Monday), and start 225mg  propafenone BID on 4/30 (Wednesday) Update BMP, Mag Continue 120mg  dilt daily I requested she check BP and pulse more regularly while initiating propafenone and notify office is either are low.       Current medicines are reviewed at length with the patient today.   The patient has concerns regarding her medicines.  The following changes were made today:   STOP flecainide  today START 225mg  propafenone BID 4/29 AM   Labs/ tests ordered today include:  Orders Placed This Encounter  Procedures   Basic metabolic panel with GFR   Magnesium    EKG 12-Lead     Disposition: Follow up with {EPMDS:28135} or EP APP {EPFOLLOW UP:28173}   Signed, Adaline Holly, NP  09/29/23  12:41 PM  Electrophysiology [facility]

## 2023-09-29 ENCOUNTER — Encounter: Payer: Self-pay | Admitting: Nurse Practitioner

## 2023-09-29 ENCOUNTER — Ambulatory Visit: Payer: Medicare Other | Attending: Cardiology | Admitting: Cardiology

## 2023-09-29 VITALS — BP 112/62 | HR 63 | Ht 63.0 in | Wt 115.8 lb

## 2023-09-29 DIAGNOSIS — I471 Supraventricular tachycardia, unspecified: Secondary | ICD-10-CM | POA: Diagnosis not present

## 2023-09-29 DIAGNOSIS — R002 Palpitations: Secondary | ICD-10-CM | POA: Diagnosis not present

## 2023-09-29 MED ORDER — PROPAFENONE HCL ER 225 MG PO CP12: 225.0000 mg | ORAL_CAPSULE | Freq: Two times a day (BID) | ORAL | 2 refills | Status: DC

## 2023-09-29 NOTE — Patient Instructions (Signed)
 Medication Instructions:  STOP Flecainide  START Propafenone 225 mg twice daily  (START ON WEDNESDAY)   *If you need a refill on your cardiac medications before your next appointment, please call your pharmacy*  Lab Work: Your provider would like for you to have following labs drawn today BMET, MAG.   If you have labs (blood work) drawn today and your tests are completely normal, you will receive your results only by: MyChart Message (if you have MyChart) OR A paper copy in the mail If you have any lab test that is abnormal or we need to change your treatment, we will call you to review the results.  Follow-Up: At [facility], you and your health needs are our priority.  As part of our continuing mission to provide you with exceptional heart care, our providers are all part of one team.  This team includes your primary Cardiologist (physician) and Advanced Practice Providers or APPs (Physician Assistants and Nurse Practitioners) who all work together to provide you with the care you need, when you need it.  Your next appointment:   1 month(s)  Provider:   Suzann Riddle, NP    We recommend signing up for the patient portal called "MyChart".  Sign up information is provided on this After Visit Summary.  MyChart is used to connect with patients for Virtual Visits (Telemedicine).  Patients are able to view lab/test results, encounter notes, upcoming appointments, etc.  Non-urgent messages can be sent to your provider as well.   To learn more about what you can do with MyChart, go to [email or web address].

## 2023-09-30 LAB — BASIC METABOLIC PANEL WITH GFR
BUN/Creatinine Ratio: 18 (ref 12–28)
BUN: 15 mg/dL (ref 8–27)
CO2: 23 mmol/L (ref 20–29)
Calcium: 9.8 mg/dL (ref 8.7–10.3)
Chloride: 100 mmol/L (ref 96–106)
Creatinine, Ser: 0.84 mg/dL (ref 0.57–1.00)
Glucose: 84 mg/dL (ref 70–99)
Potassium: 4.4 mmol/L (ref 3.5–5.2)
Sodium: 140 mmol/L (ref 134–144)
eGFR: 73 mL/min/{1.73_m2} (ref >59)

## 2023-09-30 LAB — MAGNESIUM: Magnesium: 2.1 mg/dL (ref 1.6–2.3)

## 2023-10-08 ENCOUNTER — Encounter: Payer: Self-pay | Admitting: Nurse Practitioner

## 2023-10-08 ENCOUNTER — Other Ambulatory Visit: Payer: Self-pay | Admitting: Obstetrics and Gynecology

## 2023-10-08 DIAGNOSIS — A6004 Herpesviral vulvovaginitis: Secondary | ICD-10-CM

## 2023-10-09 MED ORDER — VALACYCLOVIR HCL 500 MG PO TABS
ORAL_TABLET | ORAL | 1 refills | Status: AC
Start: 2023-10-09 — End: ?

## 2023-10-21 ENCOUNTER — Ambulatory Visit
Admission: RE | Admit: 2023-10-21 | Discharge: 2023-10-21 | Disposition: A | Discharge status: Home / Self Care | Source: Ambulatory Visit | Attending: Nurse Practitioner | Admitting: Nurse Practitioner

## 2023-10-21 DIAGNOSIS — Z1231 Encounter for screening mammogram for malignant neoplasm of breast: Secondary | ICD-10-CM | POA: Diagnosis not present

## 2023-10-24 ENCOUNTER — Ambulatory Visit: Payer: Self-pay | Admitting: Nurse Practitioner

## 2023-10-24 ENCOUNTER — Telehealth: Payer: Self-pay

## 2023-10-24 NOTE — Telephone Encounter (Signed)
 SVT Ablation letter complete and sent via MyChart.

## 2023-10-28 ENCOUNTER — Other Ambulatory Visit: Payer: Self-pay | Admitting: Medical Genetics

## 2023-11-03 ENCOUNTER — Ambulatory Visit: Payer: Self-pay | Admitting: Nurse Practitioner

## 2023-11-03 ENCOUNTER — Other Ambulatory Visit (INDEPENDENT_AMBULATORY_CARE_PROVIDER_SITE_OTHER)

## 2023-11-03 ENCOUNTER — Other Ambulatory Visit
Admission: RE | Admit: 2023-11-03 | Discharge: 2023-11-03 | Disposition: A | Discharge status: Home / Self Care | Payer: Self-pay | Source: Ambulatory Visit | Attending: Medical Genetics | Admitting: Medical Genetics

## 2023-11-03 ENCOUNTER — Ambulatory Visit (INDEPENDENT_AMBULATORY_CARE_PROVIDER_SITE_OTHER): Admitting: *Deleted

## 2023-11-03 VITALS — Ht 63.0 in | Wt 115.0 lb

## 2023-11-03 DIAGNOSIS — Z0000 Encounter for general adult medical examination without abnormal findings: Secondary | ICD-10-CM

## 2023-11-03 DIAGNOSIS — E039 Hypothyroidism, unspecified: Secondary | ICD-10-CM | POA: Diagnosis not present

## 2023-11-03 LAB — TSH: TSH: 0.7 u[IU]/mL (ref 0.35–5.50)

## 2023-11-03 NOTE — Progress Notes (Signed)
 Subjective:   Sandra Horton is a 73 y.o. who presents for a Medicare Wellness preventive visit.  As a reminder, Annual Wellness Visits don't include a physical exam, and some assessments may be limited, especially if this visit is performed virtually. We may recommend an in-person follow-up visit with your provider if needed.  Visit Complete: Virtual I connected with  Sandra Horton on 11/03/23 by a video and audio enabled telemedicine application and verified that I am speaking with the correct person using two identifiers.  Patient Location: Home  Provider Location: Home Office  I discussed the limitations of evaluation and management by telemedicine. The patient expressed understanding and agreed to proceed.  Vital Signs: Because this visit was a virtual/telehealth visit, some criteria may be missing or patient reported. Any vitals not documented were not able to be obtained and vitals that have been documented are patient reported.    Persons Participating in Visit: Patient.  AWV Questionnaire: Yes: Patient Medicare AWV questionnaire was completed by the patient on 10/30/23; I have confirmed that all information answered by patient is correct and no changes since this date.  Cardiac Risk Factors include: advanced age (>54men, >54 women);dyslipidemia     Objective:     Today's Vitals   11/03/23 0848  Weight: 115 lb (52.2 kg)  Height: 5\' 3"  (1.6 m)   Body mass index is 20.37 kg/m.     11/03/2023    9:03 AM 04/07/2023    9:02 AM 10/14/2022    8:17 AM 07/03/2022    8:06 PM 02/26/2022   11:34 AM 09/19/2021    3:39 PM 09/14/2021    2:55 PM  Advanced Directives  Does Patient Have a Medical Advance Directive? Yes Yes Yes Yes Yes Yes Yes  Type of Estate agent of [place];Living will Living will;Healthcare Power of [employer] Power of [place];Living will Healthcare Power of [place];Living will     Does patient want to make changes to medical advance  directive?    No - Patient declined No - Patient declined  No - Patient declined  Copy of Healthcare Power of Attorney in Chart? No - copy requested No - copy requested No - copy requested No - copy requested   No - copy requested  Would patient like information on creating a medical advance directive?       No - Patient declined    Current Medications (verified) Outpatient Encounter Medications as of 11/03/2023  Medication Sig   Ascorbic Acid  (VITAMIN C ) 500 MG CHEW Chew 1 tablet by mouth 2 (two) times daily.   aspirin  81 MG tablet Take 81 mg by mouth daily.   Biotin 16109 MCG TABS Take by mouth daily.   Calcium  500 MG tablet Take 500 mg by mouth in the morning and at bedtime.   cholecalciferol  (VITAMIN D3) 25 MCG (1000 UNIT) tablet Take 1,000 Units by mouth daily.   Ibuprofen (MOTRIN PO) Take by mouth as needed.   levothyroxine  (SYNTHROID ) 100 MCG tablet TAKE 1 TABLET BY MOUTH DAILY BEFORE BREAKFAST.   magnesium  chloride (SLOW-MAG) 64 MG TBEC SR tablet Take 1 tablet by mouth daily.   Meclizine  HCl (ANTIVERT  PO) Take by mouth as needed.   Multiple Vitamins-Minerals (MULTIVITAMIN ADULT PO) Take 1 tablet by mouth daily.   omeprazole  (PRILOSEC) 40 MG capsule TAKE 1 CAPSULE (40 MG TOTAL) BY MOUTH DAILY. WAIT 3-4 HOURS AFTER THYROID  MED. TAKE 30 MINUTES BEFORE FOOD LUNCH/DINNER   PARoxetine  (PAXIL ) 20 MG tablet TAKE  1 TABLET BY MOUTH EVERY DAY   polyethylene glycol powder (GLYCOLAX /MIRALAX ) 17 GM/SCOOP powder Take 17 g by mouth daily as needed.   rosuvastatin  (CRESTOR ) 40 MG tablet TAKE 1 TABLET BY MOUTH EVERY DAY   valACYclovir  (VALTREX ) 500 MG tablet TAKE 1 TAB DAILY AS PREVENTIVE OR TWICE DAILY FOR 3 DAYS AS NEEDED FOR SYMPTOM FLARE   diltiazem  (CARDIZEM  CD) 120 MG 24 hr capsule Take 1 capsule (120 mg total) by mouth daily.   propafenone  (RYTHMOL  SR) 225 MG 12 hr capsule Take 1 capsule (225 mg total) by mouth 2 (two) times daily. (Patient not taking: Reported on 11/03/2023)   No  facility-administered encounter medications on file as of 11/03/2023.    Allergies (verified) Penicillin v potassium, Penicillins, and Tetanus toxoid   History: Past Medical History:  Diagnosis Date   Allergy    Anti-cardiolipin antibody positive    Dr. Ivette Marks    Anxiety    Arthritis    BRCA negative 2016   MyRisk neg    Depression    Family history of ovarian cancer 2016   MyRisk neg; affected sister is BRCA neg   GERD (gastroesophageal reflux disease)    Herpes simplex    Hyperlipidemia    Hypothyroidism    Osteopenia    DEXA 09/02/13    Rosacea    Ruptured disc, cervical    Sleep apnea    no CPAP.  has Inspire   Vertigo    per pt cervicogenic Physical therapy helped    Vitamin D  deficiency    Wears dentures    full upper   Past Surgical History:  Procedure Laterality Date   CERVICAL BIOPSY  W/ LOOP ELECTRODE EXCISION     COLONOSCOPY     COLONOSCOPY WITH PROPOFOL  N/A 03/30/2018   Procedure: COLONOSCOPY WITH Biopsies;  Surgeon: Marnee Sink, MD;  Location: [facility];  Service: Endoscopy;  Laterality: N/A;   COLONOSCOPY WITH PROPOFOL  N/A 04/07/2023   Procedure: COLONOSCOPY WITH PROPOFOL  with polypectomy;  Surgeon: Marnee Sink, MD;  Location: [facility];  Service: Endoscopy;  Laterality: N/A;   DILATION AND CURETTAGE OF UTERUS  30 years ago   DRUG INDUCED ENDOSCOPY N/A 09/25/2021   Procedure: DRUG INDUCED ENDOSCOPY;  Surgeon: Virgina Grills, MD;  Location: [facility];  Service: ENT;  Laterality: N/A;   EYE SURGERY     b/l cataract    IMPLANTATION OF HYPOGLOSSAL NERVE STIMULATOR Right 02/26/2022   Procedure: IMPLANTATION OF HYPOGLOSSAL NERVE STIMULATOR;  Surgeon: Virgina Grills, MD;  Location: [facility];  Service: ENT;  Laterality: Right;   POLYPECTOMY N/A 03/30/2018   Procedure: POLYPECTOMY;  Surgeon: Marnee Sink, MD;  Location: [facility];  Service: Endoscopy;  Laterality: N/A;   SKIN BIOPSY  2024   In  october [place] skin   Family History  Problem Relation Age of Onset   Cancer Mother        kidney met to lungs    Diabetes Mother    Thyroid  disease Mother    Heart disease Father    Hypertension Father    Stroke Father    Heart attack Father 12   Thyroid  disease Sister    Ovarian cancer Sister 3       BRCA neg   Colon cancer Maternal Aunt 40   Diabetes Daughter        type 1    Breast cancer Neg Hx    Social History   Socioeconomic History  Marital status: Married    Spouse name: Not on file   Number of children: Not on file   Years of education: Not on file   Highest education level: Some college, no degree  Occupational History   Not on file  Tobacco Use   Smoking status: Never   Smokeless tobacco: Never  Vaping Use   Vaping status: Never Used  Substance and Sexual Activity   Alcohol use: No    Alcohol/week: 0.0 standard drinks of alcohol   Drug use: No   Sexual activity: Yes    Birth control/protection: Post-menopausal  Other Topics Concern   Not on file  Social History Narrative    [employer] [facility] retired as of 04/02/18    Social Drivers of Health   Financial Resource Strain: Low Risk  (10/30/2023)   Overall Financial Resource Strain (CARDIA)    Difficulty of Paying Living Expenses: Not hard at all  Food Insecurity: No Food Insecurity (10/30/2023)   Hunger Vital Sign    Worried About Running Out of Food in the Last Year: Never true    Ran Out of Food in the Last Year: Never true  Transportation Needs: No Transportation Needs (10/30/2023)   PRAPARE - Administrator, Civil Service (Medical): No    Lack of Transportation (Non-Medical): No  Physical Activity: Inactive (10/30/2023)   Exercise Vital Sign    Days of Exercise per Week: 0 days    Minutes of Exercise per Session: 0 min  Stress: No Stress Concern Present (10/30/2023)   [employer] of Occupational Health - Occupational Stress Questionnaire    Feeling of Stress : Not at all   Social Connections: Socially Integrated (10/30/2023)   Social Connection and Isolation Panel [NHANES]    Frequency of Communication with Friends and Family: More than three times a week    Frequency of Social Gatherings with Friends and Family: Twice a week    Attends Religious Services: More than 4 times per year    Active Member of [employer] or Organizations: Yes    Attends Engineer, structural: More than 4 times per year    Marital Status: Married    Tobacco Counseling Counseling given: Not Answered    Clinical Intake:  Pre-visit preparation completed: Yes  Pain : No/denies pain     BMI - recorded: 20.37 Nutritional Status: BMI of 19-24  Normal Nutritional Risks: None Diabetes: No  No results found for: "HGBA1C"   How often do you need to have someone help you when you read instructions, pamphlets, or other written materials from your doctor or pharmacy?: 1 - Never  Interpreter Needed?: No  Information entered by :: R. Jerine Surles LPN   Activities of Daily Living     11/03/2023    8:52 AM 04/07/2023    9:01 AM  In your present state of health, do you have any difficulty performing the following activities:  Hearing? 1 0  Comment some loss   Vision? 0 0  Comment glasses   Difficulty concentrating or making decisions? 0 0  Walking or climbing stairs? 0   Dressing or bathing? 0   Doing errands, shopping? 0   Preparing Food and eating ? N   Using the Toilet? N   In the past six months, have you accidently leaked urine? Y   Do you have problems with loss of bowel control? N   Managing your Medications? N   Managing your Finances? N   Housekeeping or managing  your Housekeeping? N     Patient Care Team: Bluford Burkitt, NP as PCP - General (Nurse Practitioner) Ardeen Kohler, MD as PCP - Electrophysiology (Cardiology) Woodson He, MD as Consulting Physician (Sleep Medicine) End, Veryl Gottron, MD as Consulting Physician (Cardiology)  I have updated your Care  Teams any recent Medical Services you may have received from other providers in the past year.     Assessment:    This is a routine wellness examination for [place].  Hearing/Vision screen Hearing Screening - Comments:: Some issues Vision Screening - Comments:: glasses   Goals Addressed             This Visit's Progress    Patient Stated       Wants to consistently walk every morning       Depression Screen     11/03/2023    8:59 AM 06/26/2023    8:11 AM 10/14/2022    8:15 AM 06/25/2022    9:42 AM 09/25/2021    8:08 AM 09/19/2021    3:36 PM 09/18/2020    3:37 PM  PHQ 2/9 Scores  PHQ - 2 Score 0 0 0 0 0 0 0  PHQ- 9 Score 0 2         Fall Risk     11/03/2023    8:54 AM 10/10/2022    7:13 PM 06/25/2022    9:42 AM 09/25/2021    8:08 AM 09/19/2021    3:40 PM  Fall Risk   Falls in the past year? 0 0 0 0 0  Number falls in past yr: 0 0 0 0 0  Injury with Fall? 0 0 0 0   Risk for fall due to : No Fall Risks No Fall Risks No Fall Risks No Fall Risks   Follow up Falls evaluation completed;Falls prevention discussed Falls prevention discussed;Falls evaluation completed Falls evaluation completed Falls evaluation completed Falls evaluation completed    MEDICARE RISK AT HOME:  Medicare Risk at Home Any stairs in or around the home?: Yes If so, are there any without handrails?: No Home free of loose throw rugs in walkways, pet beds, electrical cords, etc?: Yes Adequate lighting in your home to reduce risk of falls?: Yes Life alert?: No Use of a cane, walker or w/c?: No Grab bars in the bathroom?: Yes Shower chair or bench in shower?: Yes Elevated toilet seat or a handicapped toilet?: No  TIMED UP AND GO:  Was the test performed?  No  Cognitive Function: 6CIT completed    09/15/2019    3:48 PM  MMSE - Mini Mental State Exam  Not completed: Unable to complete        11/03/2023    9:04 AM 10/14/2022    8:15 AM  6CIT Screen  What Year? 0 points 0 points  What month? 0  points 0 points  What time? 0 points 0 points  Count back from 20 0 points 0 points  Months in reverse 0 points 0 points  Repeat phrase 0 points 0 points  Total Score 0 points 0 points    Immunizations Immunization History  Administered Date(s) Administered   Fluad Quad(high Dose 65+) 03/04/2022   Hepatitis B, ADULT 05/01/2017, 05/29/2017, 10/29/2017   Influenza, High Dose Seasonal PF 04/02/2018, 03/27/2021   Influenza, Mdck, Trivalent,PF 6+ MOS(egg free) 03/24/2023   Influenza, Seasonal, Injecte, Preservative Fre 03/03/2012   Influenza,inj,Quad PF,6+ Mos 06/15/2019   Influenza-Unspecified 03/23/2020, 05/12/2022   Pneumococcal Conjugate-13 02/22/2016, 04/21/2017   Pneumococcal Polysaccharide-23  04/22/2018   Zoster Recombinant(Shingrix) 12/06/2019, 02/14/2020   Zoster, Live 07/30/2012    Screening Tests Health Maintenance  Topic Date Due   Medicare Annual Wellness (AWV)  10/14/2023   INFLUENZA VACCINE  01/02/2024   MAMMOGRAM  10/20/2025   Colonoscopy  04/06/2030   Pneumonia Vaccine 49+ Years old  Completed   DEXA SCAN  Completed   Hepatitis C Screening  Completed   Zoster Vaccines- Shingrix  Completed   HPV VACCINES  Aged Out   Meningococcal B Vaccine  Aged Out   DTaP/Tdap/Td  Discontinued   COVID-19 Vaccine  Discontinued    Health Maintenance  Health Maintenance Due  Topic Date Due   Medicare Annual Wellness (AWV)  10/14/2023   Health Maintenance Items Addressed: Need to have a flu shot annually.  Additional Screening:  Vision Screening: Recommended annual ophthalmology exams for early detection of glaucoma and other disorders of the eye. Up to date [facility] Would you like a referral to an eye doctor? No    Dental Screening: Recommended annual dental exams for proper oral hygiene  Community Resource Referral / Chronic Care Management: CRR required this visit?  No   CCM required this visit?  No   Plan:    I have personally reviewed and noted the  following in the patient's chart:   Medical and social history Use of alcohol, tobacco or illicit drugs  Current medications and supplements including opioid prescriptions. Patient is not currently taking opioid prescriptions. Functional ability and status Nutritional status Physical activity Advanced directives List of other physicians Hospitalizations, surgeries, and ER visits in previous 12 months Vitals Screenings to include cognitive, depression, and falls Referrals and appointments  In addition, I have reviewed and discussed with patient certain preventive protocols, quality metrics, and best practice recommendations. A written personalized care plan for preventive services as well as general preventive health recommendations were provided to patient.   Felicitas Horse, LPN   02/06/6212   After Visit Summary: (MyChart) Due to this being a telephonic visit, the after visit summary with patients personalized plan was offered to patient via MyChart   Notes: Nothing significant to report at this time.

## 2023-11-03 NOTE — Patient Instructions (Signed)
 Sandra Horton , Thank you for taking time out of your busy schedule to complete your Annual Wellness Visit with me. I enjoyed our conversation and look forward to speaking with you again next year. I, as well as your care team,  appreciate your ongoing commitment to your health goals. Please review the following plan we discussed and let me know if I can assist you in the future. Your Game plan/ To Do List    Referrals: If you haven't heard from the office you've been referred to, please reach out to them at the phone provided.  Remember to get a flu shot annually. Follow up Visits: Next Medicare AWV with our clinical staff: 11/08/24 @ 8:10   Have you seen your provider in the last 6 months (3 months if uncontrolled diabetes)? Yes Next Office Visit with your provider: 06/29/24  Clinician Recommendations:  Aim for 30 minutes of exercise or brisk walking, 6-8 glasses of water , and 5 servings of fruits and vegetables each day.       This is a list of the screening recommended for you and due dates:  Health Maintenance  Topic Date Due   Flu Shot  01/02/2024   Medicare Annual Wellness Visit  11/02/2024   Mammogram  10/20/2025   Colon Cancer Screening  04/06/2030   Pneumonia Vaccine  Completed   DEXA scan (bone density measurement)  Completed   Hepatitis C Screening  Completed   Zoster (Shingles) Vaccine  Completed   HPV Vaccine  Aged Out   Meningitis B Vaccine  Aged Out   DTaP/Tdap/Td vaccine  Discontinued   COVID-19 Vaccine  Discontinued    Advanced directives: (Copy Requested) Please bring a copy of your health care power of attorney and living will to the office to be added to your chart at your convenience. You can mail to [facility] [address] [place], [place] [address] or email to [email or web address] Advance Care Planning is important because it:  [x]  Makes sure you receive the medical care that is consistent with your values, goals, and preferences  [x]  It  provides guidance to your family and loved ones and reduces their decisional burden about whether or not they are making the right decisions based on your wishes.

## 2023-11-03 NOTE — Progress Notes (Signed)
 Electrophysiology Office Note:   Date:  11/04/2023  ID:  Sandra Horton, DOB 13-Oct-1950, MRN [id number]  Primary Cardiologist: None Electrophysiologist: Ardeen Kohler, MD      History of Present Illness:   Sandra Horton is a 73 y.o. female with h/o depression, anxiety, OSA s/p Inspire implant, aortic atherosclerosis on CT and SVT who is being seen today for evaluation of SVT.   Discussed the use of AI scribe software for clinical note transcription with the patient, who gave verbal consent to proceed.  History of Present Illness Sandra Horton is a 73 year old female who presents with episodes of rapid heartbeats for evaluation. She experiences short bursts of rapid heartbeats, particularly at night. Monitoring shows these last only seconds.She has tried multiple medications including flecainide , propafenone , and diltiazem . Flecainide  and propafenone  were not well tolerated, causing significant drops in blood pressure and general discomfort. Diltiazem  alone does not prevent the episodes, but she finds it more tolerable than the other medications. She takes diltiazem  once daily, at night, to manage her symptoms. The palpitations cause her anxiety but episodes are often short. Otherwise no new or acute complaints.  Review of systems complete and found to be negative unless listed in HPI.   EP Information / Studies Reviewed:    EKG is not ordered today. EKG from 09/29/23 reviewed which showed sinus rhythm.      Echo 05/15/2023: Mildly decreased LV systolic function, LVEF 50 to 57%.  Grade 1 diastolic dysfunction. Normal RV size and function. Moderate MR.  Moderate to severe TR.  Mild AI.   Zio monitor 05/09/2023:   The patient was monitored for 7 days, 1 hour.   The predominant rhythm was sinus with an average rate of 71 bpm (range 53-140 bpm in sinus).   There were frequent PAC's (8% PAC burden) and rare PVC's.   2038 supraventricular runs were observed, lasting up to 16.5 seconds with a  maximum rate of 210 bpm.   No sustained arrhythmia or prolonged pause was seen.   Patient triggered events correspond to sinus rhythm, PSVT, PAC's, and PVC's.   Sinus rhythm with frequent PAC's and numerous episodes of brief PSVT, as detailed above.  Nuclear Stress 07/01/23:  Low risk, probably normal exercise myocardial perfusion stress test without evidence of significant ischemia or scar on the perfusion images.   ECG portion of the stress test demonstrates 1 mm of inferolateral ST depressions, which is nonspecific in the setting of normal perfusion images.   Left ventricular systolic function is normal (LVEF greater than 65%).   Mild coronary artery calcification and aortic atherosclerosis are noted on the attenuation correction CT images.  Physical Exam:   VS:  BP 110/66   Ht 5\' 3"  (1.6 m)   Wt 118 lb 3.2 oz (53.6 kg)   SpO2 97%   BMI 20.94 kg/m    Wt Readings from Last 3 Encounters:  11/04/23 118 lb 3.2 oz (53.6 kg)  11/03/23 115 lb (52.2 kg)  09/29/23 115 lb 12.8 oz (52.5 kg)     GEN: Well nourished, well developed in no acute distress NECK: No JVD CARDIAC: Normal rate, regular rhythm RESPIRATORY:  Clear to auscultation without rales, wheezing or rhonchi  ABDOMEN: Soft, non-distended EXTREMITIES:  No edema; No deformity   ASSESSMENT AND PLAN:     #Palpitations: Frequent PACs and frequent non-sustained SVT episodes on Zio. #SVT: Zio tracings appear most consistent with short bursts of atrial tachycardia.  -Still occurring despite diltiazem . Increasing diltiazem   is limited by low resting HR. Previously tried suppression with flecainide  and propafenone  and patient reported side effects with both. Given the brief, unsustained nature of likely AT on her Zio, discussed limited chances of success with EP study and ablation. Patient felt like she benefited most by reassurance regarding benign nature of her palpitations to date. She would like to continue with diltiazem  treatment  for now. We will reassess symptoms in several months. If she develops more sustained episodes then EP study and ablation are more likely to be successful.     Follow up with Dr. Daneil Dunker in 6 months  Signed, Ardeen Kohler, MD

## 2023-11-03 NOTE — Progress Notes (Signed)
 PCP: Bluford Burkitt, NP   Chief Complaint  Patient presents with   Gynecologic Exam    No concerns    HPI:      [name] is a 73 y.o. [phone] who LMP was No LMP recorded. Patient is postmenopausal., presents today for her medicare annual examination.  Her menses are absent due to menopause. She does not have PMB, no pelvic pain. S/p thickened EM on u/s in past with neg EMB/SHGM. She does not have vasomotor sx.   Sex activity: no longer sexually active; contraception - post menopausal status. She does not have vaginal dryness/sx; used to be on vag ERT but stopped it since no longer needed.   Last Pap: 06/15/19 Results were: no abnormalities /neg HPV DNA 2016. No longer indicated Hx of STDs: HSV, takes valtrex  daily and prn. Gets sx about Q3 months, RF with PCP.   Last mammogram: 10/21/23 with PCP, Results were: normal--routine follow-up in 12 months There is no FH of breast cancer. There is a FH of ovarian cancer in her sister. Her sister is BRCA neg and pt is MyRisk neg 2016. The patient does not do self-breast exams. Pt had neg GYN u/s for ovaries and neg ca-125 in past;  this screening no longer indicated/recommended.  Colonoscopy: 11/24 with Dr. Ole Berkeley with polyps, repeat after 7 yrs; 10/19 with Dr. Ole Berkeley; repeat due after 5 years  DEXA: with PCP  She does get adequate calcium  and Vitamin D  in her diet.  Labs with PCP.    Past Medical History:  Diagnosis Date   Allergy    Anti-cardiolipin antibody positive    Dr. Ivette Marks    Anxiety    Arthritis    BRCA negative 2016   MyRisk neg    Depression    Family history of ovarian cancer 2016   MyRisk neg; affected sister is BRCA neg   GERD (gastroesophageal reflux disease)    Herpes simplex    Hyperlipidemia    Hypothyroidism    Osteopenia    DEXA 09/02/13    Rosacea    Ruptured disc, cervical    Sleep apnea    no CPAP.  has Inspire   Vertigo    per pt cervicogenic Physical therapy helped    Vitamin D  deficiency     Wears dentures    full upper    Past Surgical History:  Procedure Laterality Date   CERVICAL BIOPSY  W/ LOOP ELECTRODE EXCISION     COLONOSCOPY     COLONOSCOPY WITH PROPOFOL  N/A 03/30/2018   Procedure: COLONOSCOPY WITH Biopsies;  Surgeon: Marnee Sink, MD;  Location: [facility];  Service: Endoscopy;  Laterality: N/A;   COLONOSCOPY WITH PROPOFOL  N/A 04/07/2023   Procedure: COLONOSCOPY WITH PROPOFOL  with polypectomy;  Surgeon: Marnee Sink, MD;  Location: [facility];  Service: Endoscopy;  Laterality: N/A;   DILATION AND CURETTAGE OF UTERUS  30 years ago   DRUG INDUCED ENDOSCOPY N/A 09/25/2021   Procedure: DRUG INDUCED ENDOSCOPY;  Surgeon: Virgina Grills, MD;  Location: [facility];  Service: ENT;  Laterality: N/A;   EYE SURGERY     b/l cataract    IMPLANTATION OF HYPOGLOSSAL NERVE STIMULATOR Right 02/26/2022   Procedure: IMPLANTATION OF HYPOGLOSSAL NERVE STIMULATOR;  Surgeon: Virgina Grills, MD;  Location: [facility];  Service: ENT;  Laterality: Right;   POLYPECTOMY N/A 03/30/2018   Procedure: POLYPECTOMY;  Surgeon: Marnee Sink, MD;  Location: [facility];  Service: Endoscopy;  Laterality:  N/A;   SKIN BIOPSY  2024   In october Nicasio skin    Family History  Problem Relation Age of Onset   Cancer Mother        kidney met to lungs    Diabetes Mother    Thyroid  disease Mother    Heart disease Father    Hypertension Father    Stroke Father    Heart attack Father 18   Thyroid  disease Sister    Ovarian cancer Sister 75       BRCA neg   Colon cancer Maternal Aunt 35   Diabetes Daughter        type 1    Breast cancer Neg Hx     Social History   Socioeconomic History   Marital status: Married    Spouse name: Not on file   Number of children: Not on file   Years of education: Not on file   Highest education level: Some college, no degree  Occupational History   Not on file  Tobacco Use   Smoking status: Never    Smokeless tobacco: Never  Vaping Use   Vaping status: Never Used  Substance and Sexual Activity   Alcohol use: No    Alcohol/week: 0.0 standard drinks of alcohol   Drug use: No   Sexual activity: Not Currently    Birth control/protection: Post-menopausal  Other Topics Concern   Not on file  Social History Narrative    [employer] retired as of 04/02/18    Social Drivers of Health   Financial Resource Strain: Low Risk  (10/30/2023)   Overall Financial Resource Strain (CARDIA)    Difficulty of Paying Living Expenses: Not hard at all  Food Insecurity: No Food Insecurity (10/30/2023)   Hunger Vital Sign    Worried About Running Out of Food in the Last Year: Never true    Ran Out of Food in the Last Year: Never true  Transportation Needs: No Transportation Needs (10/30/2023)   PRAPARE - Administrator, Civil Service (Medical): No    Lack of Transportation (Non-Medical): No  Physical Activity: Inactive (10/30/2023)   Exercise Vital Sign    Days of Exercise per Week: 0 days    Minutes of Exercise per Session: 0 min  Stress: No Stress Concern Present (10/30/2023)   [employer] of Occupational Health - Occupational Stress Questionnaire    Feeling of Stress : Not at all  Social Connections: Socially Integrated (10/30/2023)   Social Connection and Isolation Panel [NHANES]    Frequency of Communication with Friends and Family: More than three times a week    Frequency of Social Gatherings with Friends and Family: Twice a week    Attends Religious Services: More than 4 times per year    Active Member of [employer] or Organizations: Yes    Attends Engineer, structural: More than 4 times per year    Marital Status: Married  Catering manager Violence: Not At Risk (11/03/2023)   Humiliation, Afraid, Rape, and Kick questionnaire    Fear of Current or Ex-Partner: No    Emotionally Abused: No    Physically Abused: No    Sexually Abused: No    Current Meds   Medication Sig   Ascorbic Acid  (VITAMIN C ) 500 MG CHEW Chew 1 tablet by mouth 2 (two) times daily.   aspirin  81 MG tablet Take 81 mg by mouth daily.   Biotin 16109 MCG TABS Take by mouth daily.   Calcium   500 MG tablet Take 500 mg by mouth in the morning and at bedtime.   cholecalciferol  (VITAMIN D3) 25 MCG (1000 UNIT) tablet Take 1,000 Units by mouth daily.   diltiazem  (CARDIZEM  CD) 120 MG 24 hr capsule Take 1 capsule (120 mg total) by mouth daily.   Ibuprofen (MOTRIN PO) Take by mouth as needed.   levothyroxine  (SYNTHROID ) 100 MCG tablet TAKE 1 TABLET BY MOUTH DAILY BEFORE BREAKFAST.   magnesium  chloride (SLOW-MAG) 64 MG TBEC SR tablet Take 1 tablet by mouth daily.   Meclizine  HCl (ANTIVERT  PO) Take by mouth as needed.   Multiple Vitamins-Minerals (MULTIVITAMIN ADULT PO) Take 1 tablet by mouth daily.   omeprazole  (PRILOSEC) 40 MG capsule TAKE 1 CAPSULE (40 MG TOTAL) BY MOUTH DAILY. WAIT 3-4 HOURS AFTER THYROID  MED. TAKE 30 MINUTES BEFORE FOOD LUNCH/DINNER   PARoxetine  (PAXIL ) 20 MG tablet TAKE 1 TABLET BY MOUTH EVERY DAY   polyethylene glycol powder (GLYCOLAX /MIRALAX ) 17 GM/SCOOP powder Take 17 g by mouth daily as needed.   rosuvastatin  (CRESTOR ) 40 MG tablet TAKE 1 TABLET BY MOUTH EVERY DAY   valACYclovir  (VALTREX ) 500 MG tablet TAKE 1 TAB DAILY AS PREVENTIVE OR TWICE DAILY FOR 3 DAYS AS NEEDED FOR SYMPTOM FLARE      ROS:  Review of Systems  Constitutional:  Negative for fatigue, fever and unexpected weight change.  Respiratory:  Negative for cough, shortness of breath and wheezing.   Cardiovascular:  Negative for chest pain, palpitations and leg swelling.  Gastrointestinal:  Negative for blood in stool, constipation, diarrhea, nausea and vomiting.  Endocrine: Negative for cold intolerance, heat intolerance and polyuria.  Genitourinary:  Negative for dyspareunia, dysuria, flank pain, frequency, genital sores, hematuria, menstrual problem, pelvic pain, urgency, vaginal bleeding,  vaginal discharge and vaginal pain.  Musculoskeletal:  Positive for arthralgias. Negative for back pain, joint swelling and myalgias.  Skin:  Positive for rash.  Neurological:  Positive for dizziness and headaches. Negative for syncope, light-headedness and numbness.  Hematological:  Negative for adenopathy.  Psychiatric/Behavioral:  Negative for agitation, confusion, sleep disturbance and suicidal ideas. The patient is not nervous/anxious.      Objective: BP (!) 94/54   Pulse 60   Ht 5\' 3"  (1.6 m)   Wt 117 lb (53.1 kg)   BMI 20.73 kg/m    Physical Exam Constitutional:      Appearance: She is well-developed.  Genitourinary:     Vulva normal.     Right Labia: No rash, tenderness or lesions.    Left Labia: No tenderness, lesions or rash.    No vaginal discharge, erythema or tenderness.     Moderate vaginal atrophy present.     Right Adnexa: not tender and no mass present.    Left Adnexa: not tender and no mass present.    No cervical motion tenderness, friability or polyp.     Uterus is not enlarged or tender.  Breasts:    Right: No mass, nipple discharge, skin change or tenderness.     Left: No mass, nipple discharge, skin change or tenderness.  Neck:     Thyroid : No thyromegaly.  Cardiovascular:     Rate and Rhythm: Normal rate and regular rhythm.     Heart sounds: Normal heart sounds. No murmur heard. Pulmonary:     Effort: Pulmonary effort is normal.     Breath sounds: Normal breath sounds.  Abdominal:     Palpations: Abdomen is soft.     Tenderness: There is no abdominal tenderness. There is  no guarding or rebound.  Neurological:     General: No focal deficit present.     Mental Status: She is alert and oriented to person, place, and time.     Cranial Nerves: No cranial nerve deficit.  Vitals reviewed.     Assessment/Plan: Encounter for annual routine gynecological examination  Encounter for screening mammogram for malignant neoplasm of breast; pt current  on mammo          GYN counsel mammography screening, menopause, adequate intake of calcium  and vitamin D , diet and exercise    F/U  Return if symptoms worsen or fail to improve. /Q2 yrs or prn  Kimothy Kishimoto B. Galo Sayed, PA-C 11/04/2023 2:53 PM

## 2023-11-04 ENCOUNTER — Ambulatory Visit: Admitting: Cardiology

## 2023-11-04 ENCOUNTER — Encounter: Payer: Self-pay | Admitting: Cardiology

## 2023-11-04 ENCOUNTER — Ambulatory Visit (INDEPENDENT_AMBULATORY_CARE_PROVIDER_SITE_OTHER): Admitting: Obstetrics and Gynecology

## 2023-11-04 ENCOUNTER — Encounter: Payer: Self-pay | Admitting: Obstetrics and Gynecology

## 2023-11-04 ENCOUNTER — Ambulatory Visit: Attending: Cardiology | Admitting: Cardiology

## 2023-11-04 VITALS — BP 110/66 | Ht 63.0 in | Wt 118.2 lb

## 2023-11-04 VITALS — BP 94/54 | HR 60 | Ht 63.0 in | Wt 117.0 lb

## 2023-11-04 DIAGNOSIS — N951 Menopausal and female climacteric states: Secondary | ICD-10-CM

## 2023-11-04 DIAGNOSIS — R002 Palpitations: Secondary | ICD-10-CM | POA: Insufficient documentation

## 2023-11-04 DIAGNOSIS — A6004 Herpesviral vulvovaginitis: Secondary | ICD-10-CM

## 2023-11-04 DIAGNOSIS — Z1231 Encounter for screening mammogram for malignant neoplasm of breast: Secondary | ICD-10-CM

## 2023-11-04 DIAGNOSIS — Z01419 Encounter for gynecological examination (general) (routine) without abnormal findings: Secondary | ICD-10-CM

## 2023-11-04 DIAGNOSIS — I471 Supraventricular tachycardia, unspecified: Secondary | ICD-10-CM | POA: Diagnosis not present

## 2023-11-04 NOTE — Patient Instructions (Signed)
 I value your feedback and you entrusting [place] with your care. If you get a [place] patient survey, I would appreciate you taking the time to let us know about your experience today. Thank you! ? ? ?

## 2023-11-04 NOTE — Patient Instructions (Signed)
 Medication Instructions:  Your physician recommends that you continue on your current medications as directed. Please refer to the Current Medication list given to you today.  *If you need a refill on your cardiac medications before your next appointment, please call your pharmacy*  Follow-Up: At [facility], you and your health needs are our priority.  As part of our continuing mission to provide you with exceptional heart care, our providers are all part of one team.  This team includes your primary Cardiologist (physician) and Advanced Practice Providers or APPs (Physician Assistants and Nurse Practitioners) who all work together to provide you with the care you need, when you need it.  Your next appointment:   6 months  Provider:   You may see Ardeen Kohler, MD or one of the following Advanced Practice Providers on your designated Care Team:   Mertha Abrahams, [place] [address] [place], PA-C Suzann Riddle, NP Creighton Doffing, NP

## 2023-11-10 ENCOUNTER — Other Ambulatory Visit

## 2023-11-20 ENCOUNTER — Telehealth: Admitting: Physician Assistant

## 2023-11-20 DIAGNOSIS — M5441 Lumbago with sciatica, right side: Secondary | ICD-10-CM

## 2023-11-20 DIAGNOSIS — M5442 Lumbago with sciatica, left side: Secondary | ICD-10-CM

## 2023-11-20 MED ORDER — PREDNISONE 10 MG (21) PO TBPK: ORAL_TABLET | ORAL | 0 refills | Status: DC

## 2023-11-20 NOTE — Progress Notes (Signed)
 I have spent 5 minutes in review of e-visit questionnaire, review and updating patient chart, medical decision making and response to patient.   Piedad Climes, PA-C

## 2023-11-20 NOTE — Progress Notes (Signed)
 E-Visit for Back Pain   We are sorry that you are not feeling well.  Here is how we plan to help!  Based on what you have shared with me it looks like you mostly have acute back pain.  Acute back pain is defined as musculoskeletal pain that can resolve in 1-3 weeks with conservative treatment.  I have prescribed a prednisone  pack to take as directed.    Back pain is very common.  The pain often gets better over time.  The cause of back pain is usually not dangerous.  Most people can learn to manage their back pain on their own.  Home Care Stay active.  Start with short walks on flat ground if you can.  Try to walk farther each day. Do not sit, drive or stand in one place for more than 30 minutes.  Do not stay in bed. Do not avoid exercise or work.  Activity can help your back heal faster. Be careful when you bend or lift an object.  Bend at your knees, keep the object close to you, and do not twist. Sleep on a firm mattress.  Lie on your side, and bend your knees.  If you lie on your back, put a pillow under your knees. Only take medicines as told by your doctor. Put ice on the injured area. Put ice in a plastic bag Place a towel between your skin and the bag Leave the ice on for 15-20 minutes, 3-4 times a day for the first 2-3 days. 210 After that, you can switch between ice and heat packs. Ask your doctor about back exercises or massage. Avoid feeling anxious or stressed.  Find good ways to deal with stress, such as exercise.  Get Help Right Way If: Your pain does not go away with rest or medicine. Your pain does not go away in 1 week. You have new problems. You do not feel well. The pain spreads into your legs. You cannot control when you poop (bowel movement) or pee (urinate) You feel sick to your stomach (nauseous) or throw up (vomit) You have belly (abdominal) pain. You feel like you may pass out (faint). If you develop a fever.  Make Sure you: Understand these  instructions. Will watch your condition Will get help right away if you are not doing well or get worse.  Your e-visit answers were reviewed by a board certified advanced clinical practitioner to complete your personal care plan.  Depending on the condition, your plan could have included both over the counter or prescription medications.  If there is a problem please reply  once you have received a response from your provider.  Your safety is important to us .  If you have drug allergies check your prescription carefully.    You can use MyChart to ask questions about today's visit, request a non-urgent call back, or ask for a work or school excuse for 24 hours related to this e-Visit. If it has been greater than 24 hours you will need to follow up with your provider, or enter a new e-Visit to address those concerns.  You will get an e-mail in the next two days asking about your experience.  I hope that your e-visit has been valuable and will speed your recovery. Thank you for using e-visits.

## 2023-11-30 ENCOUNTER — Other Ambulatory Visit: Payer: Self-pay | Admitting: Nurse Practitioner

## 2023-11-30 DIAGNOSIS — E039 Hypothyroidism, unspecified: Secondary | ICD-10-CM

## 2023-12-01 ENCOUNTER — Ambulatory Visit (HOSPITAL_COMMUNITY): Admit: 2023-12-01 | Admitting: Cardiology

## 2023-12-01 ENCOUNTER — Encounter (HOSPITAL_COMMUNITY)

## 2023-12-01 SURGERY — SVT ABLATION
Anesthesia: General

## 2023-12-29 ENCOUNTER — Other Ambulatory Visit: Payer: Self-pay | Admitting: Nurse Practitioner

## 2023-12-29 DIAGNOSIS — E039 Hypothyroidism, unspecified: Secondary | ICD-10-CM

## 2023-12-31 NOTE — Telephone Encounter (Signed)
 Vm left asking pt to CB

## 2024-01-06 ENCOUNTER — Encounter: Payer: Self-pay | Admitting: Adult Health

## 2024-02-11 DIAGNOSIS — L570 Actinic keratosis: Secondary | ICD-10-CM | POA: Diagnosis not present

## 2024-02-11 DIAGNOSIS — Z08 Encounter for follow-up examination after completed treatment for malignant neoplasm: Secondary | ICD-10-CM | POA: Diagnosis not present

## 2024-02-11 DIAGNOSIS — Z85828 Personal history of other malignant neoplasm of skin: Secondary | ICD-10-CM | POA: Diagnosis not present

## 2024-02-11 DIAGNOSIS — L814 Other melanin hyperpigmentation: Secondary | ICD-10-CM | POA: Diagnosis not present

## 2024-02-11 DIAGNOSIS — L821 Other seborrheic keratosis: Secondary | ICD-10-CM | POA: Diagnosis not present

## 2024-02-25 ENCOUNTER — Other Ambulatory Visit: Payer: Self-pay | Admitting: Medical

## 2024-03-18 DIAGNOSIS — H35371 Puckering of macula, right eye: Secondary | ICD-10-CM | POA: Diagnosis not present

## 2024-03-18 DIAGNOSIS — H43813 Vitreous degeneration, bilateral: Secondary | ICD-10-CM | POA: Diagnosis not present

## 2024-03-18 DIAGNOSIS — Z961 Presence of intraocular lens: Secondary | ICD-10-CM | POA: Diagnosis not present

## 2024-03-21 DIAGNOSIS — Z23 Encounter for immunization: Secondary | ICD-10-CM | POA: Diagnosis not present

## 2024-06-22 ENCOUNTER — Encounter: Payer: Self-pay | Admitting: Medical

## 2024-06-22 ENCOUNTER — Ambulatory Visit: Attending: Medical | Admitting: Medical

## 2024-06-22 VITALS — BP 108/58 | HR 96 | Ht 65.0 in | Wt 121.4 lb

## 2024-06-22 DIAGNOSIS — I5032 Chronic diastolic (congestive) heart failure: Secondary | ICD-10-CM | POA: Diagnosis not present

## 2024-06-22 DIAGNOSIS — I779 Disorder of arteries and arterioles, unspecified: Secondary | ICD-10-CM | POA: Insufficient documentation

## 2024-06-22 DIAGNOSIS — I491 Atrial premature depolarization: Secondary | ICD-10-CM | POA: Insufficient documentation

## 2024-06-22 DIAGNOSIS — R9431 Abnormal electrocardiogram [ECG] [EKG]: Secondary | ICD-10-CM | POA: Insufficient documentation

## 2024-06-22 DIAGNOSIS — I38 Endocarditis, valve unspecified: Secondary | ICD-10-CM | POA: Diagnosis not present

## 2024-06-22 DIAGNOSIS — E782 Mixed hyperlipidemia: Secondary | ICD-10-CM | POA: Diagnosis not present

## 2024-06-22 DIAGNOSIS — I2510 Atherosclerotic heart disease of native coronary artery without angina pectoris: Secondary | ICD-10-CM | POA: Diagnosis not present

## 2024-06-22 DIAGNOSIS — I471 Supraventricular tachycardia, unspecified: Secondary | ICD-10-CM | POA: Insufficient documentation

## 2024-06-22 DIAGNOSIS — G4733 Obstructive sleep apnea (adult) (pediatric): Secondary | ICD-10-CM | POA: Insufficient documentation

## 2024-06-22 MED ORDER — DILTIAZEM HCL ER COATED BEADS 120 MG PO CP24: 120.0000 mg | ORAL_CAPSULE | Freq: Every day | ORAL | 0 refills | Status: AC

## 2024-06-22 NOTE — Patient Instructions (Signed)
 Medication Instructions:  Your physician recommends that you continue on your current medications as directed. Please refer to the Current Medication list given to you today.    *If you need a refill on your cardiac medications before your next appointment, please call your pharmacy*  Lab Work: No labs ordered today    Testing/Procedures: Your physician has requested that you have an echocardiogram. Echocardiography is a painless test that uses sound waves to create images of your heart. It provides your doctor with information about the size and shape of your heart and how well your hearts chambers and valves are working.   You may receive an ultrasound enhancing agent through an IV if needed to better visualize your heart during the echo. This procedure takes approximately one hour.  There are no restrictions for this procedure.  This will take place at [address] [facility] [address]) [address], [place] [address]  Please note: We ask at that you not bring children with you during ultrasound (echo/ vascular) testing. Due to room size and safety concerns, children are not allowed in the ultrasound rooms during exams. Our front office staff cannot provide observation of children in our lobby area while testing is being conducted. An adult accompanying a patient to their appointment will only be allowed in the ultrasound room at the discretion of the ultrasound technician under special circumstances. We apologize for any inconvenience.   Your physician has requested that you have a carotid duplex. This test is an ultrasound of the carotid arteries in your neck. It looks at blood flow through these arteries that supply the brain with blood.   Allow one hour for this exam.  There are no restrictions or special instructions.  This will take place at [address] [facility] [address]) [address], [place] [address]  Please note: We ask at that you not bring children with you during  ultrasound (echo/ vascular) testing. Due to room size and safety concerns, children are not allowed in the ultrasound rooms during exams. Our front office staff cannot provide observation of children in our lobby area while testing is being conducted. An adult accompanying a patient to their appointment will only be allowed in the ultrasound room at the discretion of the ultrasound technician under special circumstances. We apologize for any inconvenience.   Follow-Up: At [facility], you and your health needs are our priority.  As part of our continuing mission to provide you with exceptional heart care, our providers are all part of one team.  This team includes your primary Cardiologist (physician) and Advanced Practice Providers or APPs (Physician Assistants and Nurse Practitioners) who all work together to provide you with the care you need, when you need it.  Your next appointment:   1 year(s)  Provider:   Mikey Fishman, PA-C

## 2024-06-22 NOTE — Progress Notes (Signed)
 " Cardiology Office Note   Date:  06/22/2024  ID:  Sandra Horton, DOB [birth year], MRN [id number] PCP: Gretel App, NP  [facility] Providers Cardiologist:  None Electrophysiologist:  Fonda Kitty, MD   History of Present Illness Sandra Horton is a 74 y.o. female with a hx HLD, depression, anxiety, SVT, PACs, OSA s/p Inspire implant, aortic atherosclerosis on CT who is being seen for 1 year follow-up.    She saw Dr. Mady 10/11/20 for SOB and chest pain for the last 1.5 years. Myoview  and echo were ordered. Myoview  showed TWI inversion during stress, overall normal, low risk, no evidence of ischemia. Echo showed LVEF 55-60%, G1DD, normal RV function and PA pressure, mild MR, mild late prolapse of posterior leaflet of MV, mod TR, mild thicking of aortic valve, trivial AI, mild sclerosis aortic valve.     Seen 11/16/20 via televisit for review of myoview  and echo, ordered for sob and cest pain.  Myoview  was low risk with no ischemia. Echo showed preserved EF, G1DD, and mild valvular disease. She did not want to undergo invasive procedures since symptoms were not limiting her function.    Patient was seen 04/2023 reporting worsening palpitations. Heart monitor showed NSR, 71bpm, frequent PACs and rare PVC, 2038 SVT runs, lasting up to 16.5 seconds, triggered events corresponded with sinus rhythm, PSVT, PACs, PVCs.  Echo showed EF of 50 to 55%, grade 1 diastolic dysfunction, moderate MR, moderate to severe TR, mild AI.  She was referred to EP.  Patient did not tolerate flecainide  or Toprol for known, causing significant drops of blood pressure.  Diltiazem  showed improvement, but did not prevent the episodes.  She was last seen 11/04/2023 and was continued on diltiazem .  Today, the patient she is overall OK. She has occasional lightheadedness when she gets up in the morning likely from low BP. She takes diltiazem  at night and it overall tolerating it. She denies chest pain. She has a normal amount of  DOE when she walks. She has occasional lower leg edema. She sees her PCP next week. She does no formal activity, but does stay active at home. Diet is good.   Studies Reviewed EKG Interpretation Date/Time:  Tuesday June 22 2024 13:23:22 EST Ventricular Rate:  94 PR Interval:  128 QRS Duration:  86 QT Interval:  348 QTC Calculation: 435 R Axis:   45  Text Interpretation: Sinus rhythm with Premature atrial complexes ST & T wave abnormality, consider inferior ischemia When compared with ECG of 29-Sep-2023 09:42, Premature atrial complexes are now Present Vent. rate has increased BY  31 BPM Confirmed by Franchester, Zamariyah Furukawa ([id number]) on 06/22/2024 1:27:16 PM    MPI 06/2023   Low risk, probably normal exercise myocardial perfusion stress test without evidence of significant ischemia or scar on the perfusion images.   ECG portion of the stress test demonstrates 1 mm of inferolateral ST depressions, which is nonspecific in the setting of normal perfusion images.   Left ventricular systolic function is normal (LVEF greater than 65%).   Mild coronary artery calcification and aortic atherosclerosis are noted on the attenuation correction CT images.  Echo 05/2023 1. Left ventricular ejection fraction, by estimation, is 50 to 55%. Left  ventricular ejection fraction by 3D volume is 43 %. The left ventricle has  mildly decreased function. The left ventricle demonstrates regional wall  motion abnormalities (mid to  distal septal wall hypokinesis). Left ventricular diastolic parameters are  consistent with Grade I diastolic dysfunction (impaired relaxation).  The  average left ventricular global longitudinal strain is -15.0 %.   2. Right ventricular systolic function is normal. The right ventricular  size is normal. There is normal pulmonary artery systolic pressure. The  estimated right ventricular systolic pressure is 24.0 mmHg.   3. The mitral valve is normal in structure. Moderate mitral valve   regurgitation. No evidence of mitral stenosis.   4. Tricuspid valve regurgitation is moderate to severe.   5. The aortic valve is tricuspid. Aortic valve regurgitation is mild. No  aortic stenosis is present.   6. The inferior vena cava is normal in size with greater than 50%  respiratory variability, suggesting right atrial pressure of 3 mmHg.    Heart monitor 05/2023    The patient was monitored for 7 days, 1 hour.   The predominant rhythm was sinus with an average rate of 71 bpm (range 53-140 bpm in sinus).   There were frequent PAC's (8% PAC burden) and rare PVC's.   2038 supraventricular runs were observed, lasting up to 16.5 seconds with a maximum rate of 210 bpm.   No sustained arrhythmia or prolonged pause was seen.   Patient triggered events correspond to sinus rhythm, PSVT, PAC's, and PVC's.   Sinus rhythm with frequent PAC's and numerous episodes of brief PSVT, as detailed above.  Echo 11/2020 1. Left ventricular ejection fraction, by estimation, is 55 to 60%. The  left ventricle has normal function. The left ventricle has no regional  wall motion abnormalities. Left ventricular diastolic parameters are  consistent with Grade I diastolic  dysfunction (impaired relaxation).   2. Right ventricular systolic function is normal. The right ventricular  size is normal. There is normal pulmonary artery systolic pressure.   3. The mitral valve is abnormal. Mild mitral valve regurgitation. No  evidence of mitral stenosis. There is mild late systolic prolapse of  posterior leaflet of the mitral valve.   4. Tricuspid valve regurgitation is moderate.   5. The aortic valve is tricuspid. There is mild thickening of the aortic  valve. Aortic valve regurgitation is trivial. Mild aortic valve sclerosis  is present, with no evidence of aortic valve stenosis.   6. The inferior vena cava is normal in size with greater than 50%  respiratory variability, suggesting right atrial pressure of 3  mmHg.    Myoview  lexiscan  2022 Narrative & Impression  T wave inversion was noted during stress in the III, aVF, V5, V6 and II leads. There was no ST segment deviation noted during stress. The study is normal. This is a low risk study. The left ventricular ejection fraction is normal visually although calculated value is 43%. recommend correlation with echo There is no evidence for ischemia       Risk Assessment/Calculations           Physical Exam VS:  BP (!) 108/58 (BP Location: Left Arm, Patient Position: Sitting, Cuff Size: Normal)   Pulse 96   Ht 5' 5 (1.651 m)   Wt 121 lb 6.4 oz (55.1 kg)   SpO2 94%   BMI 20.20 kg/m        Wt Readings from Last 3 Encounters:  06/22/24 121 lb 6.4 oz (55.1 kg)  11/04/23 117 lb (53.1 kg)  11/04/23 118 lb 3.2 oz (53.6 kg)    GEN: Well nourished, well developed in no acute distress NECK: No JVD; No carotid bruits CARDIAC: RRR, no murmurs, rubs, gallops RESPIRATORY:  Clear to auscultation without rales, wheezing or rhonchi  ABDOMEN:  Soft, non-tender, non-distended EXTREMITIES:  No edema; No deformity   ASSESSMENT AND PLAN  HFpEF Valvular disease Most recent echo 05/2023 showed EF of 50 to 55%, grade 1 diastolic dysfunction, moderate MR, mild AI. Patient reports normal DOE when she exerts herself.  She is euvolemic on exam.  I will update an echocardiogram.  PACs/pSVT Palpitations Prior heart monitor showed PAC burden of 8% with 2038 runs of SVT.  She was referred to EP.  Patient did not tolerate propafenone  or flecainide .  She has been tolerating diltiazem .  She has occasional lightheadedness in the morning likely from low blood pressure.  Continue diltiazem  120 mg daily, will send in a refill today.  OSA S/p Inspire implant followed by pulmonology.   HLD LDL 65.  Continue Crestor  40 mg daily.  Carotid artery disease Patient has history of dizziness and lightheadedness, likely from low blood pressure, however I will update a  carotid ultrasound.  Continue aspirin  and Crestor .  EKG changes CAC EKG shows NSR with new TWI inf/lat leads. She denies anginal symptoms. She had a Myoview  lexiscan  06/2023 was low risk, probably normal exercise stress test without evidence of ischemia or scar, normal EF, mild coronary artery calcification and aortic atherosclerosis on CT imaging.  I will order an echocardiogram. Continue ASA and Crestor      Dispo: follow-up in 1 year  Signed, Acelyn Basham VEAR Fishman, PA-C   "

## 2024-06-28 ENCOUNTER — Telehealth: Payer: Self-pay | Admitting: Nurse Practitioner

## 2024-06-28 NOTE — Telephone Encounter (Signed)
 Lm and sent MyChart:  Your appointment time for 06/29/2024 has been changed to 11:20 am, do to the office opening at a later time do to inclement weather.

## 2024-06-29 ENCOUNTER — Encounter: Payer: Self-pay | Admitting: Nurse Practitioner

## 2024-06-29 ENCOUNTER — Ambulatory Visit: Admitting: Nurse Practitioner

## 2024-06-29 ENCOUNTER — Ambulatory Visit: Payer: Medicare Other | Admitting: Nurse Practitioner

## 2024-06-29 VITALS — BP 102/60 | HR 67 | Temp 98.1°F | Ht 65.0 in | Wt 119.8 lb

## 2024-06-29 DIAGNOSIS — G4733 Obstructive sleep apnea (adult) (pediatric): Secondary | ICD-10-CM

## 2024-06-29 DIAGNOSIS — F3342 Major depressive disorder, recurrent, in full remission: Secondary | ICD-10-CM

## 2024-06-29 DIAGNOSIS — I471 Supraventricular tachycardia, unspecified: Secondary | ICD-10-CM

## 2024-06-29 DIAGNOSIS — E785 Hyperlipidemia, unspecified: Secondary | ICD-10-CM

## 2024-06-29 DIAGNOSIS — E559 Vitamin D deficiency, unspecified: Secondary | ICD-10-CM

## 2024-06-29 DIAGNOSIS — Z1231 Encounter for screening mammogram for malignant neoplasm of breast: Secondary | ICD-10-CM

## 2024-06-29 DIAGNOSIS — E039 Hypothyroidism, unspecified: Secondary | ICD-10-CM | POA: Diagnosis not present

## 2024-06-29 LAB — CBC WITH DIFFERENTIAL/PLATELET
Basophils Absolute: 0 10*3/uL (ref 0.0–0.1)
Basophils Relative: 0.6 % (ref 0.0–3.0)
Eosinophils Absolute: 0 10*3/uL (ref 0.0–0.7)
Eosinophils Relative: 0.7 % (ref 0.0–5.0)
HCT: 40.6 % (ref 36.0–46.0)
Hemoglobin: 13.9 g/dL (ref 12.0–15.0)
Lymphocytes Relative: 14.7 % (ref 12.0–46.0)
Lymphs Abs: 0.9 10*3/uL (ref 0.7–4.0)
MCHC: 34.2 g/dL (ref 30.0–36.0)
MCV: 96.4 fl (ref 78.0–100.0)
Monocytes Absolute: 0.3 10*3/uL (ref 0.1–1.0)
Monocytes Relative: 4.9 % (ref 3.0–12.0)
Neutro Abs: 4.9 10*3/uL (ref 1.4–7.7)
Neutrophils Relative %: 79.1 % — ABNORMAL HIGH (ref 43.0–77.0)
Platelets: 255 10*3/uL (ref 150.0–400.0)
RBC: 4.21 Mil/uL (ref 3.87–5.11)
RDW: 12.4 % (ref 11.5–15.5)
WBC: 6.2 10*3/uL (ref 4.0–10.5)

## 2024-06-29 LAB — COMPREHENSIVE METABOLIC PANEL WITH GFR
ALT: 34 U/L (ref 3–35)
AST: 30 U/L (ref 5–37)
Albumin: 4.5 g/dL (ref 3.5–5.2)
Alkaline Phosphatase: 71 U/L (ref 39–117)
BUN: 15 mg/dL (ref 6–23)
CO2: 30 meq/L (ref 19–32)
Calcium: 10 mg/dL (ref 8.4–10.5)
Chloride: 102 meq/L (ref 96–112)
Creatinine, Ser: 0.82 mg/dL (ref 0.40–1.20)
GFR: 70.24 mL/min
Glucose, Bld: 84 mg/dL (ref 70–99)
Potassium: 4.3 meq/L (ref 3.5–5.1)
Sodium: 139 meq/L (ref 135–145)
Total Bilirubin: 0.7 mg/dL (ref 0.2–1.2)
Total Protein: 7.2 g/dL (ref 6.0–8.3)

## 2024-06-29 LAB — LIPID PANEL
Cholesterol: 146 mg/dL (ref 28–200)
HDL: 69.8 mg/dL
LDL Cholesterol: 62 mg/dL (ref 10–99)
NonHDL: 76.58
Total CHOL/HDL Ratio: 2
Triglycerides: 73 mg/dL (ref 10.0–149.0)
VLDL: 14.6 mg/dL (ref 0.0–40.0)

## 2024-06-29 LAB — TSH: TSH: 0.31 u[IU]/mL — ABNORMAL LOW (ref 0.35–5.50)

## 2024-06-29 LAB — VITAMIN D 25 HYDROXY (VIT D DEFICIENCY, FRACTURES): VITD: 72.31 ng/mL (ref 30.00–100.00)

## 2024-06-29 NOTE — Progress Notes (Signed)
 " Leron Glance, NP-C Phone: [phone]  Sandra Horton is a 74 y.o. female who presents today for yearly follow up.   Discussed the use of AI scribe software for clinical note transcription with the patient, who gave verbal consent to proceed.  History of Present Illness   Sandra Horton is a 74 year old female with supraventricular tachycardia who presents for a routine follow-up visit.  She has a history of supraventricular tachycardia (SVT) and is currently managed with diltiazem , which she takes at night due to experiencing lightheadedness in the mornings. Her symptoms improve as the day progresses. She has previously tried flecainide  and another medication, both of which were not tolerated. An ablation was discussed with her cardiologist but ultimately not performed.  She is on Crestor  for cholesterol management and reports no issues with this medication. For her sleep apnea, she uses [facility] and is satisfied with its effectiveness.  She takes levothyroxine  for thyroid  management and reports no symptoms such as temperature intolerance, hair, skin, or nail changes. She also takes Paxil  for mood stabilization and notes experiencing some 'winter blues' but nothing severe.  Her preventive care is up to date, including a mammogram in May, a colonoscopy, and all recommended vaccinations. She also takes a vitamin D  supplement.  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  No chest pain, shortness of breath, or swelling. Dizziness in the mornings related to her medication.      Tobacco Use History[1]  Medications Ordered Prior to Encounter[2]   ROS see history of present illness  Objective  Physical Exam Vitals:   06/29/24 1123  BP: 102/60  Pulse: 67  Temp: 98.1 F (36.7 C)  SpO2: 94%    BP Readings from Last 3 Encounters:  06/29/24 102/60  06/22/24 (!) 108/58  11/04/23 (!)  94/54   Wt Readings from Last 3 Encounters:  06/29/24 119 lb 12.8 oz (54.3 kg)  06/22/24 121 lb 6.4 oz (55.1 kg)  11/04/23 117 lb (53.1 kg)    Physical Exam Constitutional:      General: She is not in acute distress.    Appearance: Normal appearance.  HENT:     Head: Normocephalic.  Cardiovascular:     Rate and Rhythm: Normal rate and regular rhythm.     Heart sounds: Normal heart sounds.  Pulmonary:     Effort: Pulmonary effort is normal.     Breath sounds: Normal breath sounds.  Skin:    General: Skin is warm and dry.  Neurological:     General: No focal deficit present.     Mental Status: She is alert.  Psychiatric:        Mood and Affect: Mood normal.        Behavior: Behavior normal.     Assessment/Plan: Please see individual problem list.    SVT (supraventricular tachycardia) Assessment & Plan: Managed with diltiazem . Lightheadedness occurs in the morning, improving throughout the day. Ablation not pursued due to symptom severity. Previous medications not tolerated. Continue diltiazem , taken at night to minimize lightheadedness. Follow up with Cardiology as scheduled.   Orders: -     CBC with Differential/Platelet  Depression, major, recurrent, in complete remission Assessment & Plan: Managed with Paxil . Occasional low mood attributed to winter blues, no major depressive episodes. Continue Paxil  20 mg daily for depression management. Encouraged to contact if worsening symptoms, unusual behavior changes or suicidal thoughts occur.  Hypothyroidism, unspecified type Assessment & Plan: Managed with levothyroxine . No symptoms of temperature intolerance, hair, skin, or nail changes. Continue levothyroxine  for hypothyroidism management. Check TSH.   Orders: -     TSH  Hyperlipidemia, unspecified hyperlipidemia type Assessment & Plan: Managed with Crestor  40 mg daily. Continue Crestor  for cholesterol management. Check lipid panel.   Orders: -     Comprehensive  metabolic panel with GFR -     Lipid panel  OSA (obstructive sleep apnea) Assessment & Plan: Significant improvement is reported with the [facility] device. Continue current management. Follow up with Pulmonology as scheduled.    Vitamin D  deficiency -     VITAMIN D  25 Hydroxy (Vit-D Deficiency, Fractures)  Screening mammogram for breast cancer -     3D Screening Mammogram, Left and Right; Future     Return in about 1 year (around 06/29/2025) for Follow up, sooner as needed.   Leron Glance, NP-C [facility] [place]     [1]  Social History Tobacco Use  Smoking Status Never  Smokeless Tobacco Never  [2]  Current Outpatient Medications on File Prior to Visit  Medication Sig Dispense Refill   Ascorbic Acid  (VITAMIN C ) 500 MG CHEW Chew 1 tablet by mouth 2 (two) times daily.     aspirin  81 MG tablet Take 81 mg by mouth daily.     Biotin 89999 MCG TABS Take by mouth daily.     Calcium  500 MG tablet Take 500 mg by mouth in the morning and at bedtime.     cholecalciferol  (VITAMIN D3) 25 MCG (1000 UNIT) tablet Take 1,000 Units by mouth daily.     diltiazem  (CARDIZEM  CD) 120 MG 24 hr capsule Take 1 capsule (120 mg total) by mouth daily. 90 capsule 0   Ibuprofen (MOTRIN PO) Take by mouth as needed.     levothyroxine  (SYNTHROID ) 100 MCG tablet TAKE 1 TABLET BY MOUTH DAILY BEFORE BREAKFAST. 90 tablet 3   magnesium  chloride (SLOW-MAG) 64 MG TBEC SR tablet Take 1 tablet by mouth daily.     Meclizine  HCl (ANTIVERT  PO) Take by mouth as needed.     Multiple Vitamins-Minerals (MULTIVITAMIN ADULT PO) Take 1 tablet by mouth daily.     omeprazole  (PRILOSEC) 40 MG capsule TAKE 1 CAPSULE (40 MG TOTAL) BY MOUTH DAILY. WAIT 3-4 HOURS AFTER THYROID  MED. TAKE 30 MINUTES BEFORE FOOD LUNCH/DINNER 90 capsule 3   PARoxetine  (PAXIL ) 20 MG tablet TAKE 1 TABLET BY MOUTH EVERY DAY 90 tablet 3   polyethylene glycol powder (GLYCOLAX /MIRALAX ) 17 GM/SCOOP powder Take 17 g by mouth daily as  needed. 850 g 11   rosuvastatin  (CRESTOR ) 40 MG tablet TAKE 1 TABLET BY MOUTH EVERY DAY 90 tablet 3   valACYclovir  (VALTREX ) 500 MG tablet TAKE 1 TAB DAILY AS PREVENTIVE OR TWICE DAILY FOR 3 DAYS AS NEEDED FOR SYMPTOM FLARE 180 tablet 1   No current facility-administered medications on file prior to visit.   "

## 2024-07-07 ENCOUNTER — Encounter: Payer: Self-pay | Admitting: Nurse Practitioner

## 2024-07-07 NOTE — Assessment & Plan Note (Signed)
 Significant improvement is reported with the [facility] device. Continue current management. Follow up with Pulmonology as scheduled.

## 2024-07-07 NOTE — Assessment & Plan Note (Signed)
 Managed with Crestor  40 mg daily. Continue Crestor  for cholesterol management. Check lipid panel.

## 2024-07-07 NOTE — Assessment & Plan Note (Signed)
 Managed with levothyroxine . No symptoms of temperature intolerance, hair, skin, or nail changes. Continue levothyroxine  for hypothyroidism management. Check TSH.

## 2024-07-07 NOTE — Assessment & Plan Note (Signed)
 Managed with diltiazem . Lightheadedness occurs in the morning, improving throughout the day. Ablation not pursued due to symptom severity. Previous medications not tolerated. Continue diltiazem , taken at night to minimize lightheadedness. Follow up with Cardiology as scheduled.

## 2024-07-07 NOTE — Assessment & Plan Note (Signed)
 Managed with Paxil . Occasional low mood attributed to winter blues, no major depressive episodes. Continue Paxil  20 mg daily for depression management. Encouraged to contact if worsening symptoms, unusual behavior changes or suicidal thoughts occur.

## 2024-07-08 ENCOUNTER — Ambulatory Visit: Payer: Self-pay | Admitting: Nurse Practitioner

## 2024-07-08 ENCOUNTER — Ambulatory Visit

## 2024-07-08 ENCOUNTER — Ambulatory Visit: Payer: Self-pay | Admitting: Medical

## 2024-07-08 DIAGNOSIS — E039 Hypothyroidism, unspecified: Secondary | ICD-10-CM

## 2024-07-08 DIAGNOSIS — R9431 Abnormal electrocardiogram [ECG] [EKG]: Secondary | ICD-10-CM

## 2024-07-08 DIAGNOSIS — I779 Disorder of arteries and arterioles, unspecified: Secondary | ICD-10-CM

## 2024-07-08 LAB — ECHOCARDIOGRAM COMPLETE
AR max vel: 2.14 cm2
AV Area VTI: 1.98 cm2
AV Area mean vel: 2.06 cm2
AV Mean grad: 3 mmHg
AV Peak grad: 6.3 mmHg
Ao pk vel: 1.25 m/s
Area-P 1/2: 2.87 cm2
Calc EF: 52.2 %
S' Lateral: 2.9 cm
Single Plane A2C EF: 47 %
Single Plane A4C EF: 56.8 %

## 2024-07-08 MED ORDER — LEVOTHYROXINE SODIUM 88 MCG PO TABS: 88.0000 ug | ORAL_TABLET | Freq: Every day | ORAL | 1 refills | Status: AC

## 2024-08-19 ENCOUNTER — Ambulatory Visit: Admitting: Sleep Medicine

## 2024-10-21 ENCOUNTER — Ambulatory Visit

## 2024-11-08 ENCOUNTER — Ambulatory Visit

## 2025-07-05 ENCOUNTER — Ambulatory Visit: Admitting: Nurse Practitioner
